# Patient Record
Sex: Female | Born: 1950 | Race: White | Hispanic: No | Smoking: Never smoker
Health system: Southern US, Community
[De-identification: ages and names within clinical notes are randomized; demographics above are authoritative.]

## PROBLEM LIST (undated history)

## (undated) DIAGNOSIS — F329 Major depressive disorder, single episode, unspecified: Secondary | ICD-10-CM

## (undated) DIAGNOSIS — K589 Irritable bowel syndrome without diarrhea: Secondary | ICD-10-CM

## (undated) DIAGNOSIS — E079 Disorder of thyroid, unspecified: Secondary | ICD-10-CM

## (undated) DIAGNOSIS — G894 Chronic pain syndrome: Secondary | ICD-10-CM

## (undated) DIAGNOSIS — K5090 Crohn's disease, unspecified, without complications: Secondary | ICD-10-CM

## (undated) DIAGNOSIS — R51 Headache: Secondary | ICD-10-CM

## (undated) DIAGNOSIS — M810 Age-related osteoporosis without current pathological fracture: Secondary | ICD-10-CM

## (undated) DIAGNOSIS — I639 Cerebral infarction, unspecified: Secondary | ICD-10-CM

## (undated) DIAGNOSIS — N761 Subacute and chronic vaginitis: Secondary | ICD-10-CM

## (undated) DIAGNOSIS — Z872 Personal history of diseases of the skin and subcutaneous tissue: Secondary | ICD-10-CM

## (undated) DIAGNOSIS — I10 Essential (primary) hypertension: Secondary | ICD-10-CM

## (undated) DIAGNOSIS — M5030 Other cervical disc degeneration, unspecified cervical region: Secondary | ICD-10-CM

## (undated) DIAGNOSIS — T7840XA Allergy, unspecified, initial encounter: Secondary | ICD-10-CM

## (undated) DIAGNOSIS — K219 Gastro-esophageal reflux disease without esophagitis: Secondary | ICD-10-CM

## (undated) DIAGNOSIS — Z972 Presence of dental prosthetic device (complete) (partial): Secondary | ICD-10-CM

## (undated) DIAGNOSIS — F32A Depression, unspecified: Secondary | ICD-10-CM

## (undated) DIAGNOSIS — F0390 Unspecified dementia without behavioral disturbance: Secondary | ICD-10-CM

## (undated) DIAGNOSIS — R109 Unspecified abdominal pain: Secondary | ICD-10-CM

## (undated) DIAGNOSIS — E039 Hypothyroidism, unspecified: Secondary | ICD-10-CM

## (undated) DIAGNOSIS — E0590 Thyrotoxicosis, unspecified without thyrotoxic crisis or storm: Secondary | ICD-10-CM

## (undated) DIAGNOSIS — R519 Headache, unspecified: Secondary | ICD-10-CM

## (undated) DIAGNOSIS — R002 Palpitations: Secondary | ICD-10-CM

## (undated) DIAGNOSIS — Z8742 Personal history of other diseases of the female genital tract: Secondary | ICD-10-CM

## (undated) DIAGNOSIS — H8109 Meniere's disease, unspecified ear: Secondary | ICD-10-CM

## (undated) DIAGNOSIS — G8929 Other chronic pain: Secondary | ICD-10-CM

## (undated) DIAGNOSIS — F419 Anxiety disorder, unspecified: Secondary | ICD-10-CM

## (undated) DIAGNOSIS — R5382 Chronic fatigue, unspecified: Secondary | ICD-10-CM

## (undated) DIAGNOSIS — M5126 Other intervertebral disc displacement, lumbar region: Secondary | ICD-10-CM

## (undated) DIAGNOSIS — G9332 Myalgic encephalomyelitis/chronic fatigue syndrome: Secondary | ICD-10-CM

## (undated) DIAGNOSIS — K8689 Other specified diseases of pancreas: Secondary | ICD-10-CM

## (undated) HISTORY — PX: TUBAL LIGATION: SHX77

## (undated) HISTORY — PX: APPENDECTOMY: SHX54

## (undated) HISTORY — DX: Unspecified abdominal pain: R10.9

## (undated) HISTORY — PX: ABDOMINAL HYSTERECTOMY: SHX81

## (undated) HISTORY — DX: Other intervertebral disc displacement, lumbar region: M51.26

## (undated) HISTORY — DX: Palpitations: R00.2

## (undated) HISTORY — DX: Other chronic pain: G89.29

## (undated) HISTORY — PX: KNEE SURGERY: SHX244

## (undated) HISTORY — DX: Chronic pain syndrome: G89.4

## (undated) HISTORY — DX: Myalgic encephalomyelitis/chronic fatigue syndrome: G93.32

## (undated) HISTORY — DX: Meniere's disease, unspecified ear: H81.09

## (undated) HISTORY — DX: Irritable bowel syndrome, unspecified: K58.9

## (undated) HISTORY — DX: Depression, unspecified: F32.A

## (undated) HISTORY — DX: Crohn's disease, unspecified, without complications: K50.90

## (undated) HISTORY — DX: Major depressive disorder, single episode, unspecified: F32.9

## (undated) HISTORY — PX: WISDOM TOOTH EXTRACTION: SHX21

## (undated) HISTORY — DX: Chronic fatigue, unspecified: R53.82

## (undated) HISTORY — DX: Other cervical disc degeneration, unspecified cervical region: M50.30

---

## 2003-11-29 ENCOUNTER — Other Ambulatory Visit: Payer: Self-pay

## 2005-03-08 ENCOUNTER — Ambulatory Visit: Payer: Self-pay

## 2005-06-28 ENCOUNTER — Emergency Department: Payer: Self-pay | Admitting: Emergency Medicine

## 2005-06-28 ENCOUNTER — Other Ambulatory Visit: Payer: Self-pay

## 2010-02-13 ENCOUNTER — Ambulatory Visit: Payer: Self-pay | Admitting: Internal Medicine

## 2010-02-22 ENCOUNTER — Ambulatory Visit: Payer: Self-pay | Admitting: Gastroenterology

## 2010-03-01 ENCOUNTER — Ambulatory Visit: Payer: Self-pay | Admitting: Gastroenterology

## 2010-10-18 ENCOUNTER — Ambulatory Visit: Payer: Self-pay | Admitting: Gastroenterology

## 2011-02-27 ENCOUNTER — Ambulatory Visit: Payer: Self-pay | Admitting: Internal Medicine

## 2011-07-26 ENCOUNTER — Ambulatory Visit: Payer: Self-pay | Admitting: Physician Assistant

## 2012-04-10 ENCOUNTER — Ambulatory Visit: Payer: Self-pay | Admitting: Physical Medicine and Rehabilitation

## 2012-05-05 ENCOUNTER — Ambulatory Visit: Payer: Self-pay | Admitting: Internal Medicine

## 2013-11-25 ENCOUNTER — Ambulatory Visit: Payer: Self-pay | Admitting: Internal Medicine

## 2014-01-14 ENCOUNTER — Ambulatory Visit: Payer: Self-pay | Admitting: Gastroenterology

## 2014-04-07 ENCOUNTER — Ambulatory Visit: Payer: Self-pay | Admitting: Gastroenterology

## 2014-07-12 ENCOUNTER — Emergency Department: Payer: Self-pay | Admitting: Emergency Medicine

## 2014-07-13 DIAGNOSIS — M5126 Other intervertebral disc displacement, lumbar region: Secondary | ICD-10-CM | POA: Insufficient documentation

## 2014-07-13 DIAGNOSIS — M5116 Intervertebral disc disorders with radiculopathy, lumbar region: Secondary | ICD-10-CM | POA: Insufficient documentation

## 2014-07-13 DIAGNOSIS — M5136 Other intervertebral disc degeneration, lumbar region: Secondary | ICD-10-CM | POA: Insufficient documentation

## 2014-07-15 ENCOUNTER — Ambulatory Visit: Payer: Self-pay | Admitting: Physical Medicine and Rehabilitation

## 2014-10-04 ENCOUNTER — Ambulatory Visit: Payer: Self-pay | Admitting: Urgent Care

## 2014-10-04 LAB — COMPREHENSIVE METABOLIC PANEL
ALBUMIN: 3.3 g/dL — AB (ref 3.4–5.0)
ALK PHOS: 110 U/L
ALT: 21 U/L
AST: 22 U/L (ref 15–37)
Anion Gap: 5 — ABNORMAL LOW (ref 7–16)
BUN: 8 mg/dL (ref 7–18)
Bilirubin,Total: 0.2 mg/dL (ref 0.2–1.0)
CALCIUM: 8.2 mg/dL — AB (ref 8.5–10.1)
CHLORIDE: 112 mmol/L — AB (ref 98–107)
CO2: 27 mmol/L (ref 21–32)
Creatinine: 1.14 mg/dL (ref 0.60–1.30)
EGFR (Non-African Amer.): 51 — ABNORMAL LOW
Glucose: 121 mg/dL — ABNORMAL HIGH (ref 65–99)
OSMOLALITY: 286 (ref 275–301)
Potassium: 3.8 mmol/L (ref 3.5–5.1)
Sodium: 144 mmol/L (ref 136–145)
TOTAL PROTEIN: 6.9 g/dL (ref 6.4–8.2)

## 2014-10-04 LAB — CBC WITH DIFFERENTIAL/PLATELET
Basophil #: 0 10*3/uL (ref 0.0–0.1)
Basophil %: 0.2 %
Eosinophil #: 0 10*3/uL (ref 0.0–0.7)
Eosinophil %: 0.2 %
HCT: 40.4 % (ref 35.0–47.0)
HGB: 12.7 g/dL (ref 12.0–16.0)
LYMPHS ABS: 0.8 10*3/uL — AB (ref 1.0–3.6)
LYMPHS PCT: 6.1 %
MCH: 29.3 pg (ref 26.0–34.0)
MCHC: 31.4 g/dL — AB (ref 32.0–36.0)
MCV: 93 fL (ref 80–100)
MONOS PCT: 3.1 %
Monocyte #: 0.4 x10 3/mm (ref 0.2–0.9)
NEUTROS ABS: 11.4 10*3/uL — AB (ref 1.4–6.5)
NEUTROS PCT: 90.4 %
Platelet: 258 10*3/uL (ref 150–440)
RBC: 4.34 10*6/uL (ref 3.80–5.20)
RDW: 15.3 % — ABNORMAL HIGH (ref 11.5–14.5)
WBC: 12.6 10*3/uL — AB (ref 3.6–11.0)

## 2014-11-28 ENCOUNTER — Emergency Department: Payer: Self-pay | Admitting: Internal Medicine

## 2014-11-28 LAB — CBC WITH DIFFERENTIAL/PLATELET
BASOS ABS: 0 10*3/uL (ref 0.0–0.1)
Basophil %: 0.6 %
Eosinophil #: 0.3 10*3/uL (ref 0.0–0.7)
Eosinophil %: 3.4 %
HCT: 39.1 % (ref 35.0–47.0)
HGB: 12.5 g/dL (ref 12.0–16.0)
LYMPHS ABS: 1.1 10*3/uL (ref 1.0–3.6)
Lymphocyte %: 12.5 %
MCH: 30.5 pg (ref 26.0–34.0)
MCHC: 32 g/dL (ref 32.0–36.0)
MCV: 95 fL (ref 80–100)
MONOS PCT: 6.5 %
Monocyte #: 0.6 x10 3/mm (ref 0.2–0.9)
Neutrophil #: 6.5 10*3/uL (ref 1.4–6.5)
Neutrophil %: 77 %
PLATELETS: 230 10*3/uL (ref 150–440)
RBC: 4.1 10*6/uL (ref 3.80–5.20)
RDW: 15.7 % — ABNORMAL HIGH (ref 11.5–14.5)
WBC: 8.5 10*3/uL (ref 3.6–11.0)

## 2014-11-28 LAB — BASIC METABOLIC PANEL
Anion Gap: 8 (ref 7–16)
BUN: 9 mg/dL (ref 7–18)
CO2: 22 mmol/L (ref 21–32)
Calcium, Total: 7.9 mg/dL — ABNORMAL LOW (ref 8.5–10.1)
Chloride: 111 mmol/L — ABNORMAL HIGH (ref 98–107)
Creatinine: 1.23 mg/dL (ref 0.60–1.30)
GFR CALC AF AMER: 56 — AB
GFR CALC NON AF AMER: 47 — AB
GLUCOSE: 136 mg/dL — AB (ref 65–99)
OSMOLALITY: 282 (ref 275–301)
Potassium: 3.3 mmol/L — ABNORMAL LOW (ref 3.5–5.1)
Sodium: 141 mmol/L (ref 136–145)

## 2014-11-28 LAB — TROPONIN I: Troponin-I: <0.02 ng/mL

## 2014-12-04 ENCOUNTER — Emergency Department: Payer: Self-pay | Admitting: Emergency Medicine

## 2014-12-24 ENCOUNTER — Ambulatory Visit: Payer: Self-pay | Admitting: Internal Medicine

## 2014-12-29 DIAGNOSIS — R270 Ataxia, unspecified: Secondary | ICD-10-CM | POA: Insufficient documentation

## 2014-12-29 DIAGNOSIS — R296 Repeated falls: Secondary | ICD-10-CM | POA: Insufficient documentation

## 2014-12-29 DIAGNOSIS — R519 Headache, unspecified: Secondary | ICD-10-CM | POA: Insufficient documentation

## 2014-12-29 DIAGNOSIS — R51 Headache: Secondary | ICD-10-CM

## 2014-12-29 DIAGNOSIS — R42 Dizziness and giddiness: Secondary | ICD-10-CM | POA: Insufficient documentation

## 2015-01-05 ENCOUNTER — Encounter: Payer: Self-pay | Admitting: Neurology

## 2015-01-17 ENCOUNTER — Encounter: Payer: Self-pay | Admitting: Neurology

## 2015-02-15 ENCOUNTER — Encounter
Admit: 2015-02-15 | Disposition: A | Discharge status: Home / Self Care | Payer: Self-pay | Attending: Neurology | Admitting: Neurology

## 2015-03-18 ENCOUNTER — Encounter
Admit: 2015-03-18 | Disposition: A | Discharge status: Home / Self Care | Payer: Self-pay | Attending: Neurology | Admitting: Neurology

## 2015-04-26 ENCOUNTER — Other Ambulatory Visit: Payer: Self-pay | Admitting: Neurology

## 2015-04-26 DIAGNOSIS — R42 Dizziness and giddiness: Secondary | ICD-10-CM

## 2015-04-26 DIAGNOSIS — R270 Ataxia, unspecified: Secondary | ICD-10-CM

## 2015-05-03 ENCOUNTER — Ambulatory Visit
Admission: RE | Admit: 2015-05-03 | Discharge: 2015-05-03 | Disposition: A | Discharge status: Home / Self Care | Payer: Medicare Other | Source: Ambulatory Visit | Attending: Neurology | Admitting: Neurology

## 2015-05-03 DIAGNOSIS — R270 Ataxia, unspecified: Secondary | ICD-10-CM

## 2015-05-03 DIAGNOSIS — R42 Dizziness and giddiness: Secondary | ICD-10-CM

## 2015-09-08 ENCOUNTER — Ambulatory Visit: Payer: Medicare Other | Attending: Internal Medicine | Admitting: Physical Therapy

## 2015-09-08 ENCOUNTER — Encounter: Payer: Self-pay | Admitting: Physical Therapy

## 2015-09-08 DIAGNOSIS — R2689 Other abnormalities of gait and mobility: Secondary | ICD-10-CM

## 2015-09-08 DIAGNOSIS — R42 Dizziness and giddiness: Secondary | ICD-10-CM | POA: Insufficient documentation

## 2015-09-08 DIAGNOSIS — R29818 Other symptoms and signs involving the nervous system: Secondary | ICD-10-CM | POA: Diagnosis present

## 2015-09-08 NOTE — Therapy (Signed)
[place] MAIN [facility] SERVICES [address] [place], [place], [address] Phone: [phone]   Fax:  [phone]  Physical Therapy Evaluation  Patient Details  Name: Valerie Hall MRN: [id number] Date of Birth: 1951/11/19 Referring Provider:  Idelle Crouch, MD  Encounter Date: 09/08/2015      PT End of Session - 09/12/15 1033    Visit Number 1   Number of Visits 8   Date for PT Re-Evaluation 11/03/15   PT Start Time 1344   PT Stop Time 1500   PT Time Calculation (min) 76 min   Equipment Utilized During Treatment Gait belt   Activity Tolerance Patient tolerated treatment well   Behavior During Therapy [facility] for tasks assessed/performed      Past Medical History  Diagnosis Date  . Meniere's disease   . Chronic fatigue syndrome   . Chronic pain syndrome   . Chronic abdominal pain   . DDD (degenerative disc disease), cervical   . Crohn's disease   . HNP (herniated nucleus pulposus), lumbar   . Palpitations   . IBS (irritable bowel syndrome)   . Chronic abdominal pain   . Depression     Past Surgical History  Procedure Laterality Date  . Appendectomy    . Abdominal hysterectomy      There were no vitals filed for this visit.  Visit Diagnosis:  Dizziness and giddiness  Balance problem    VESTIBULAR AND BALANCE EVALUATION  Note: Pt initially wanted to be seen for a screen this date but then decided that she would like to have a PT evaluation. Pt does have an order for outpatient PT services. PT eval was performed this date however, it was an abbreviated evaluation secondary to time constraints. Will complete further testing and assessment next visit.   HISTORY: Pt has been seen in the clinic in the past and was treated for dizziness and giddiness by this writer Jan 2016. Pt has history of dizziness 15 years ago which resolved after about one month. Pt reports that she was told that she had Meniere's disease in the past but  was unclear as to any further details in regards to this issue. Pt is difficult historian and husband was present during the evaluation. Pt reports that she has had chronic issues with dizziness. Pt began to have dizziness again at the end of 2015 and sought PT treatment. Pt however had to stop PT services due to medical issues. Pt reports that she has been in bed for the last month due to her back issues. Pt states that she needs back surgery but does not want to have surgery unless she absolutely needs it. Pt states she felt that her dizziness symptoms had been better since PT earlier this year but states that she feels the symptoms are worsening again and that she notes difficulty with imbalance, unsteadiness, dizziness and vertigo. Pt reports vertigo when she turns right. Pt reports that she has chronic issue with whole body tremors which she reports she has discussed with her physicians in regards to this issue. Pt states she does not know the cause of these tremors.  Description of dizziness: vertigo, unsteadiness,falling, general unsteadiness Symptom nature: motion provoked, spontaneous, intermittent  Frequency and Duration: pt has difficulty reporting frequency and duration of symptoms.   Provocative Factors: turning to the right  Easing Factors:lying still  Progression of symptoms: pt reports her symptoms are better than they were back in January 2016 but states she  feels her symptoms are back and that she is getting dizziness with rolling to the right.  History of similar episodes: yes Falls (yes/no): yes Number of falls in past 6 months: 4  Subjective Prior Functional Level: Pt is limited community ambulator with RW.   Auditory complaints (tinnitus, pain, drainage): denies ear pain, drainage or tinnitus. Pt reports hearing loss non-acute. Vision: (last eye exam, diplopia, recent changes); denies recent changes to her vision.   Review of systems negative for red flags.   EXAMINATION:    POSTURE: rounded shoulders, forward head and slumped sitting posture        COORDINATION: Finger to Nose:  Dysmetric bilaterally Past Pointing:   Left  & Right   Pronator Drift: negative   MUSCULOSKELETAL SCREEN: Cervical Spine ROM: Cervical AROM WFL but noted decreased L/R rotation. Pt reports mild discomfort with neck AROM.    Functional Mobility:  Gait: Pt ambulates with RW with slow cadence with decreased step length and height    Balance: Pt challenged by narrow BOS activities and SLS. Balance on compliant surfaces to be assessed next session.    OCULOMOTOR / VESTIBULAR TESTING:  Oculomotor Exam- Room Light   (N=normal, ABN=abnormal)  Normal Abnormal Comments  Ocular Alignment N    Ocular ROM N    Spontaneous Nystagmus N    End-Gaze Nystagmus N  Age appropriate at end range  Left Head Thrust Deferred     Right Head Thrust Deferred    Head Shaking Nystagmus N  No nystagmus observed and pt reports mild sensation of dizziness, no vertigo; noted patient with whole body shaking towards end of head shaking and upon stopping.    BPPV TESTS:  Symptoms Duration Intensity Nystagmus  L Dix-Hallpike negative  0 none  R Dix-Hallpike vertigo Less than one minute 8/10 Appeared to be direction changing nystagmus         G-Codes - Oct 03, 2015 1044    Functional Assessment Tool Used clinical judgment   Functional Limitation Mobility: Walking and moving around   Mobility: Walking and Moving Around Current Status [phone]) At least 40 percent but less than 60 percent impaired, limited or restricted   Mobility: Walking and Moving Around Goal Status [phone]) At least 20 percent but less than 40 percent impaired, limited or restricted          PT Long Term Goals - 2015/10/03 1039    PT LONG TERM GOAL #1   Title Patient will be able to perform home program independently for self-management.   Time 8   Period Weeks   Status New   PT LONG TERM GOAL #2   Title Patient will reduce  perceived disability to low levels as indicated by <40 on Dizziness Handicap Inventory.   Time 8   Period Weeks   Status New   PT LONG TERM GOAL #3   Title Patient will demonstrate reduced falls risk as evidenced by Dynamic Gait Index (DGI) >19/24.   Time 8   Period Weeks   Status New   PT LONG TERM GOAL #4   Title Patient will have demonstrate decreased falls risk as indicated by Activities Specific Balance Confidence Scale score of 80% or greater.   Time 8   Period Weeks   Status New   PT LONG TERM GOAL #5   Title Patient reports decreased dizziness and vertigo < or equal to 4/10 on dizziness rating scale of 0-10 with provoking motions or positions.   Time 8   Period Weeks  Status New         Plan - 09/12/15 1034    Clinical Impression Statement Patient presents with listed functional deficits. Pt with chronic dizziness and vertigo symptoms that presents to PT services secondary to patient reports recent increase in her symptoms especially with right turns and rolling activties. Pt with potential signs of central issues with vestibular testing this date as direction changing nystagmus was observed with right Dix-hallpike testing. Pt reports 4 falls in the past six months and is at high risk for falls. Pt would benefit from PT services to try to address goals as set on POC, to decrease her subjective symptoms and to decrease her falls risk.    Pt will benefit from skilled therapeutic intervention in order to improve on the following deficits Decreased balance;Decreased coordination;Decreased endurance;Pain;Decreased mobility;Dizziness;Difficulty walking;Postural dysfunction   Rehab Potential Fair   PT Frequency 1x / week   PT Duration 8 weeks   PT Treatment/Interventions Canalith Repostioning;Patient/family education;Neuromuscular re-education;Balance training;Therapeutic exercise;Therapeutic activities;Gait training;Vestibular   PT Next Visit Plan Complete ABC, DGI and DHI tests.  Assess balance on Airex pad. Begin VOR exercise.    Consulted and Agree with Plan of Care Patient;Family member/caregiver   Family Member Consulted husband       Problem List There are no active problems to display for this patient.  Lady Deutscher PT, DPT Lady Deutscher 09/12/2015, 10:59 AM  [place] MAIN [facility] SERVICES [address] [place], [place], [address] Phone: [phone]   Fax:  [phone]

## 2015-09-16 ENCOUNTER — Encounter: Payer: PRIVATE HEALTH INSURANCE | Admitting: Physical Therapy

## 2015-09-27 ENCOUNTER — Encounter: Payer: PRIVATE HEALTH INSURANCE | Admitting: Physical Therapy

## 2015-11-04 ENCOUNTER — Other Ambulatory Visit: Payer: Self-pay | Admitting: Orthopedic Surgery

## 2015-11-04 DIAGNOSIS — M25531 Pain in right wrist: Secondary | ICD-10-CM

## 2015-11-25 ENCOUNTER — Ambulatory Visit
Admission: RE | Admit: 2015-11-25 | Discharge: 2015-11-25 | Disposition: A | Discharge status: Home / Self Care | Payer: Medicare Other | Source: Ambulatory Visit | Attending: Orthopedic Surgery | Admitting: Orthopedic Surgery

## 2015-11-25 DIAGNOSIS — M659 Synovitis and tenosynovitis, unspecified: Secondary | ICD-10-CM | POA: Diagnosis not present

## 2015-11-25 DIAGNOSIS — X58XXXA Exposure to other specified factors, initial encounter: Secondary | ICD-10-CM | POA: Diagnosis not present

## 2015-11-25 DIAGNOSIS — S6991XA Unspecified injury of right wrist, hand and finger(s), initial encounter: Secondary | ICD-10-CM | POA: Diagnosis not present

## 2015-11-25 DIAGNOSIS — M25531 Pain in right wrist: Secondary | ICD-10-CM | POA: Diagnosis present

## 2015-11-25 DIAGNOSIS — M19031 Primary osteoarthritis, right wrist: Secondary | ICD-10-CM | POA: Diagnosis not present

## 2015-12-08 ENCOUNTER — Other Ambulatory Visit: Payer: Self-pay | Admitting: Internal Medicine

## 2015-12-08 DIAGNOSIS — E0590 Thyrotoxicosis, unspecified without thyrotoxic crisis or storm: Secondary | ICD-10-CM

## 2015-12-22 ENCOUNTER — Encounter
Admission: RE | Admit: 2015-12-22 | Discharge: 2015-12-22 | Disposition: A | Discharge status: Home / Self Care | Payer: Medicare HMO | Source: Ambulatory Visit | Attending: Internal Medicine | Admitting: Internal Medicine

## 2015-12-22 DIAGNOSIS — E039 Hypothyroidism, unspecified: Secondary | ICD-10-CM | POA: Insufficient documentation

## 2015-12-22 DIAGNOSIS — E0590 Thyrotoxicosis, unspecified without thyrotoxic crisis or storm: Secondary | ICD-10-CM | POA: Insufficient documentation

## 2015-12-22 HISTORY — DX: Hypothyroidism, unspecified: E03.9

## 2015-12-22 HISTORY — DX: Thyrotoxicosis, unspecified without thyrotoxic crisis or storm: E05.90

## 2015-12-22 MED ORDER — SODIUM IODIDE I-123 7.4 MBQ PO CAPS
153.0000 | ORAL_CAPSULE | Freq: Once | ORAL | Status: AC
  Administered 2015-12-22: 153.9 via ORAL

## 2015-12-23 ENCOUNTER — Encounter
Admission: RE | Admit: 2015-12-23 | Discharge: 2015-12-23 | Disposition: A | Discharge status: Home / Self Care | Payer: Medicare HMO | Source: Ambulatory Visit | Attending: Internal Medicine | Admitting: Internal Medicine

## 2015-12-23 DIAGNOSIS — E0590 Thyrotoxicosis, unspecified without thyrotoxic crisis or storm: Secondary | ICD-10-CM | POA: Diagnosis present

## 2016-04-30 ENCOUNTER — Ambulatory Visit (INDEPENDENT_AMBULATORY_CARE_PROVIDER_SITE_OTHER): Payer: Medicare HMO | Admitting: Gastroenterology

## 2016-04-30 ENCOUNTER — Encounter: Payer: Self-pay | Admitting: Gastroenterology

## 2016-04-30 VITALS — BP 143/72 | HR 80 | Temp 98.4°F | Ht 61.0 in | Wt 163.0 lb

## 2016-04-30 DIAGNOSIS — K50919 Crohn's disease, unspecified, with unspecified complications: Secondary | ICD-10-CM

## 2016-04-30 DIAGNOSIS — G9332 Myalgic encephalomyelitis/chronic fatigue syndrome: Secondary | ICD-10-CM | POA: Insufficient documentation

## 2016-04-30 DIAGNOSIS — M5030 Other cervical disc degeneration, unspecified cervical region: Secondary | ICD-10-CM | POA: Insufficient documentation

## 2016-04-30 DIAGNOSIS — K5090 Crohn's disease, unspecified, without complications: Secondary | ICD-10-CM | POA: Insufficient documentation

## 2016-04-30 DIAGNOSIS — H81319 Aural vertigo, unspecified ear: Secondary | ICD-10-CM | POA: Insufficient documentation

## 2016-04-30 DIAGNOSIS — R5382 Chronic fatigue, unspecified: Secondary | ICD-10-CM

## 2016-04-30 DIAGNOSIS — K580 Irritable bowel syndrome with diarrhea: Secondary | ICD-10-CM | POA: Insufficient documentation

## 2016-04-30 DIAGNOSIS — D8989 Other specified disorders involving the immune mechanism, not elsewhere classified: Secondary | ICD-10-CM | POA: Insufficient documentation

## 2016-04-30 DIAGNOSIS — F32A Depression, unspecified: Secondary | ICD-10-CM | POA: Insufficient documentation

## 2016-04-30 DIAGNOSIS — F329 Major depressive disorder, single episode, unspecified: Secondary | ICD-10-CM | POA: Insufficient documentation

## 2016-05-01 LAB — C-REACTIVE PROTEIN: CRP: 15.9 mg/L — ABNORMAL HIGH (ref 0.0–4.9)

## 2016-05-01 NOTE — Progress Notes (Signed)
Primary Care Physician: Idelle Crouch, MD  Primary Gastroenterologist:  Dr. Lucilla Lame  Chief Complaint  Patient presents with  . Crohn's Disease    HPI: Valerie Hall is a 66 y.o. female here For follow-up of her Crohn's disease. The patient states that she had episodes of weakness and tiredness with some abdominal discomfort. The patient has had an EGD and colonoscopy with Endoscopy in the past with the Only Findings of Some Small Ulcerations in the Small Bowel. At That Time the Patient's CRP Was 75. The Patient Has Been Treated in the past with Cipro, Pentasa and Flagyl. She now reports that the symptoms have gone away. The patient has also reported some weight loss recently without trying. There is no report of any black or bloody stools.  Current Outpatient Prescriptions  Medication Sig Dispense Refill  . acetaZOLAMIDE (DIAMOX) 500 MG capsule Take by mouth.    [name] amitriptyline (ELAVIL) 150 MG tablet Take 150 mg by mouth at bedtime.    [name] atenolol (TENORMIN) 100 MG tablet Take 90 mg by mouth daily.    . cholecalciferol (VITAMIN D) 400 UNITS TABS tablet Take 2,000 Units by mouth.    . clonazePAM (KLONOPIN) 0.5 MG tablet Take by mouth 2 (two) times daily as needed for anxiety.    . diphenoxylate-atropine (LOMOTIL) 2.5-0.025 MG tablet Take by mouth.    . gabapentin (NEURONTIN) 600 MG tablet Take 600 mg by mouth 3 (three) times daily.    . meperidine (DEMEROL) 50 MG tablet TAKE 1/2 TABLET BY MOUTH 3 TIMES A DAY  0  . potassium chloride SA (K-DUR,KLOR-CON) 20 MEQ tablet Take by mouth.    . cyclobenzaprine (FLEXERIL) 10 MG tablet Take 10 mg by mouth 3 (three) times daily as needed for muscle spasms. Reported on 04/30/2016    . diazepam (VALIUM) 5 MG tablet Take 5 mg by mouth every 6 (six) hours as needed for anxiety. Reported on 04/30/2016    . levothyroxine (SYNTHROID, LEVOTHROID) 100 MCG tablet Take 100 mcg by mouth daily before breakfast. Reported on 04/30/2016    . mirtazapine  (REMERON) 15 MG tablet Take 15 mg by mouth at bedtime. Reported on 04/30/2016    . omeprazole (PRILOSEC) 20 MG capsule Take 20 mg by mouth daily. Reported on 04/30/2016     No current facility-administered medications for this visit.    Allergies as of 04/30/2016 - Review Complete 04/30/2016  Allergen Reaction Noted  . Codeine Other (See Comments) 04/30/2016  . Prochlorperazine Other (See Comments) 04/30/2016    ROS:  General: Negative for anorexia, weight loss, fever, chills, fatigue, weakness. ENT: Negative for hoarseness, difficulty swallowing , nasal congestion. CV: Negative for chest pain, angina, palpitations, dyspnea on exertion, peripheral edema.  Respiratory: Negative for dyspnea at rest, dyspnea on exertion, cough, sputum, wheezing.  GI: See history of present illness. GU:  Negative for dysuria, hematuria, urinary incontinence, urinary frequency, nocturnal urination.  Endo: Negative for unusual weight change.    Physical Examination:   BP 143/72 mmHg  Pulse 80  Temp(Src) 98.4 F (36.9 C) (Oral)  Ht 5' 1"  (1.549 m)  Wt 163 lb (73.936 kg)  BMI 30.81 kg/m2  General: Well-nourished, well-developed in no acute distress.  Eyes: No icterus. Conjunctivae pink. Mouth: Oropharyngeal mucosa moist and pink , no lesions erythema or exudate. Lungs: Clear to auscultation bilaterally. Non-labored. Heart: Regular rate and rhythm, no murmurs rubs or gallops.  Abdomen: Bowel sounds are normal, nontender, nondistended, no hepatosplenomegaly or masses, no abdominal  bruits or hernia , no rebound or guarding.   Extremities: No lower extremity edema. No clubbing or deformities. Neuro: Alert and oriented x 3.  Grossly intact. Skin: Warm and dry, no jaundice.   Psych: Alert and cooperative, normal mood and affect.  Labs:    Imaging Studies: No results found.  Assessment and Plan:   Valerie Hall is a 65 y.o. y/o female who has a history of Crohn's disease. The patient is feeling  better now and states that her symptoms have abated but she is still not gaining weight. The patient will have her blood sent off a C-reactive protein and she'll also have her stools checked for fecal calprotectin. Based on this we will decide whether she needs treatment at this time. The patient has been explained the plan and agrees with it.   Note: This dictation was prepared with Dragon dictation along with smaller phrase technology. Any transcriptional errors that result from this process are unintentional.

## 2016-05-03 ENCOUNTER — Other Ambulatory Visit: Payer: Self-pay

## 2016-05-03 ENCOUNTER — Telehealth: Payer: Self-pay

## 2016-05-03 DIAGNOSIS — K50911 Crohn's disease, unspecified, with rectal bleeding: Secondary | ICD-10-CM

## 2016-05-03 MED ORDER — PREDNISONE 20 MG PO TABS: ORAL_TABLET | ORAL | Status: DC

## 2016-05-03 NOTE — Telephone Encounter (Signed)
-----   Message from Lucilla Lame, MD sent at 05/03/2016 12:32 PM EDT ----- Let the patient know that her blood test for the inflammation marker was elevated and she should start back on the Pentasa.

## 2016-05-03 NOTE — Telephone Encounter (Signed)
Pt notified of lab results. Pt prefers to try something else besides Pentasa as this makes her feel bad. Dr. Allen Norris has recommended doing a prednisone taper. Pt okay with doing this. Rx sent to [place] per pt request.

## 2016-05-04 LAB — CALPROTECTIN, FECAL: Calprotectin, Fecal: <16 ug/g (ref 0–120)

## 2016-05-13 ENCOUNTER — Emergency Department: Payer: Medicare HMO

## 2016-05-13 ENCOUNTER — Encounter: Payer: Self-pay | Admitting: Emergency Medicine

## 2016-05-13 ENCOUNTER — Emergency Department
Admission: EM | Admit: 2016-05-13 | Discharge: 2016-05-13 | Disposition: A | Discharge status: Home / Self Care | Payer: Medicare HMO | Attending: Emergency Medicine | Admitting: Emergency Medicine

## 2016-05-13 DIAGNOSIS — E876 Hypokalemia: Secondary | ICD-10-CM | POA: Insufficient documentation

## 2016-05-13 DIAGNOSIS — Z79899 Other long term (current) drug therapy: Secondary | ICD-10-CM | POA: Diagnosis not present

## 2016-05-13 DIAGNOSIS — R531 Weakness: Secondary | ICD-10-CM | POA: Diagnosis not present

## 2016-05-13 DIAGNOSIS — E039 Hypothyroidism, unspecified: Secondary | ICD-10-CM | POA: Diagnosis not present

## 2016-05-13 DIAGNOSIS — R4182 Altered mental status, unspecified: Secondary | ICD-10-CM | POA: Insufficient documentation

## 2016-05-13 DIAGNOSIS — F329 Major depressive disorder, single episode, unspecified: Secondary | ICD-10-CM | POA: Insufficient documentation

## 2016-05-13 LAB — CBC WITH DIFFERENTIAL/PLATELET
Basophils Absolute: 0.1 10*3/uL (ref 0–0.1)
EOS ABS: 0.1 10*3/uL (ref 0–0.7)
Eosinophils Relative: 1 %
HCT: 40.4 % (ref 35.0–47.0)
HEMOGLOBIN: 13.3 g/dL (ref 12.0–16.0)
Lymphocytes Relative: 16 %
Lymphs Abs: 1.8 10*3/uL (ref 1.0–3.6)
MCH: 31 pg (ref 26.0–34.0)
MCHC: 32.9 g/dL (ref 32.0–36.0)
MCV: 94.3 fL (ref 80.0–100.0)
Monocytes Absolute: 0.6 10*3/uL (ref 0.2–0.9)
Monocytes Relative: 6 %
Neutro Abs: 8.8 10*3/uL — ABNORMAL HIGH (ref 1.4–6.5)
Platelets: 255 10*3/uL (ref 150–440)
RBC: 4.29 MIL/uL (ref 3.80–5.20)
RDW: 14.4 % (ref 11.5–14.5)
WBC: 11.4 10*3/uL — AB (ref 3.6–11.0)

## 2016-05-13 LAB — URINALYSIS COMPLETE WITH MICROSCOPIC (ARMC ONLY)
BILIRUBIN URINE: NEGATIVE
GLUCOSE, UA: NEGATIVE mg/dL
HGB URINE DIPSTICK: NEGATIVE
LEUKOCYTES UA: NEGATIVE
NITRITE: NEGATIVE
Protein, ur: NEGATIVE mg/dL
RBC / HPF: NONE SEEN RBC/hpf (ref 0–5)
SPECIFIC GRAVITY, URINE: 1.006 (ref 1.005–1.030)
pH: 7 (ref 5.0–8.0)

## 2016-05-13 LAB — COMPREHENSIVE METABOLIC PANEL
ALBUMIN: 4.1 g/dL (ref 3.5–5.0)
ALT: 16 U/L (ref 14–54)
ANION GAP: 14 (ref 5–15)
AST: 27 U/L (ref 15–41)
Alkaline Phosphatase: 118 U/L (ref 38–126)
BUN: 9 mg/dL (ref 6–20)
CHLORIDE: 105 mmol/L (ref 101–111)
CO2: 23 mmol/L (ref 22–32)
CREATININE: 1.08 mg/dL — AB (ref 0.44–1.00)
Calcium: 9 mg/dL (ref 8.9–10.3)
GFR calc non Af Amer: 52 mL/min — ABNORMAL LOW (ref >60)
Glucose, Bld: 142 mg/dL — ABNORMAL HIGH (ref 65–99)
Potassium: 2.8 mmol/L — CL (ref 3.5–5.1)
SODIUM: 142 mmol/L (ref 135–145)
Total Bilirubin: 1 mg/dL (ref 0.3–1.2)
Total Protein: 6.9 g/dL (ref 6.5–8.1)

## 2016-05-13 LAB — BASIC METABOLIC PANEL
Anion gap: 7 (ref 5–15)
BUN: 8 mg/dL (ref 6–20)
CALCIUM: 8.2 mg/dL — AB (ref 8.9–10.3)
CO2: 27 mmol/L (ref 22–32)
CREATININE: 0.99 mg/dL (ref 0.44–1.00)
Chloride: 107 mmol/L (ref 101–111)
GFR calc Af Amer: >60 mL/min (ref >60)
GFR, EST NON AFRICAN AMERICAN: 58 mL/min — AB (ref >60)
Glucose, Bld: 97 mg/dL (ref 65–99)
POTASSIUM: 3.4 mmol/L — AB (ref 3.5–5.1)
SODIUM: 141 mmol/L (ref 135–145)

## 2016-05-13 LAB — AMMONIA: AMMONIA: 24 umol/L (ref 9–35)

## 2016-05-13 LAB — GLUCOSE, CAPILLARY: Glucose-Capillary: 130 mg/dL — ABNORMAL HIGH (ref 65–99)

## 2016-05-13 LAB — LIPASE, BLOOD: LIPASE: 16 U/L (ref 11–51)

## 2016-05-13 LAB — ETHANOL: Alcohol, Ethyl (B): <5 mg/dL (ref <5)

## 2016-05-13 LAB — TROPONIN I

## 2016-05-13 LAB — SALICYLATE LEVEL: Salicylate Lvl: <4 mg/dL (ref 2.8–30.0)

## 2016-05-13 LAB — MAGNESIUM: Magnesium: 1.8 mg/dL (ref 1.7–2.4)

## 2016-05-13 LAB — ACETAMINOPHEN LEVEL

## 2016-05-13 MED ORDER — LORAZEPAM 2 MG/ML IJ SOLN
1.0000 mg | Freq: Once | INTRAMUSCULAR | Status: AC
  Administered 2016-05-13: 1 mg via INTRAVENOUS
  Filled 2016-05-13: qty 1

## 2016-05-13 MED ORDER — POTASSIUM CHLORIDE CRYS ER 20 MEQ PO TBCR
40.0000 meq | EXTENDED_RELEASE_TABLET | Freq: Once | ORAL | Status: AC
Start: 2016-05-13 — End: 2016-05-13
  Administered 2016-05-13: 40 meq via ORAL
  Filled 2016-05-13: qty 2

## 2016-05-13 MED ORDER — POTASSIUM CHLORIDE 10 MEQ/100ML IV SOLN
10.0000 meq | Freq: Once | INTRAVENOUS | Status: AC
  Administered 2016-05-13: 10 meq via INTRAVENOUS
  Filled 2016-05-13: qty 100

## 2016-05-13 MED ORDER — SODIUM CHLORIDE 0.9 % IV BOLUS (SEPSIS)
500.0000 mL | Freq: Once | INTRAVENOUS | Status: AC
  Administered 2016-05-13: 500 mL via INTRAVENOUS

## 2016-05-13 MED ORDER — POTASSIUM CHLORIDE CRYS ER 20 MEQ PO TBCR: 40.0000 meq | EXTENDED_RELEASE_TABLET | Freq: Every day | ORAL | Status: DC

## 2016-05-13 NOTE — ED Provider Notes (Addendum)
[facility] Department Provider Note  ____________________________________________   I have reviewed the triage vital signs and the nursing notes.   HISTORY  Chief Complaint Weakness and Shaking    HPI Valerie Hall is a 65 y.o. female who presents today complaining of feeling weak all over and feeling very anxious. Patient has had episodes like this before. She has history of Crohn's disease although she has had no change in her chronic loose stools and no bleeding. She has a history ofirritable bowel syndrome, Crohn's disease depression thyroid issues, Mnire's disease chronic fatigue syndrome chronic pain syndrome, abdominal pain, panic attacks anxiety, palpitations, patient states she is having no pain. She states that her symptoms began about an hour ago. Patient is very anxious and upset. Husband states that she has decreased appetite but otherwise is been normal recently. She began to be very anxious recently. This is something he has seen from her many times before he states. Sometimes it happens when her potassium is low and she "can feel that it is low. Patient states she feels shaky all over. She denies any focal numbness or weakness or headache.    Past Medical History  Diagnosis Date  . Meniere's disease   . Chronic fatigue syndrome   . Chronic pain syndrome   . Chronic abdominal pain   . DDD (degenerative disc disease), cervical   . Crohn's disease ([place])   . HNP (herniated nucleus pulposus), lumbar   . Palpitations   . IBS (irritable bowel syndrome)   . Chronic abdominal pain   . Depression   . Hyperthyroidism     Per Pt, elevated thyroid levels for 4 months +  . Hypothyroidism     Hx of hypothyroidism, 10 years +    Patient Active Problem List   Diagnosis Date Noted  . Auditory vertigo 04/30/2016  . Irritable bowel syndrome with diarrhea 04/30/2016  . Clinical depression 04/30/2016  . Degeneration of intervertebral disc  of cervical region 04/30/2016  . Crohn's disease ([place]) 04/30/2016  . CFIDS (chronic fatigue and immune dysfunction syndrome) 04/30/2016  . Hypothyroidism   . Head revolving around 12/29/2014  . Cephalalgia 12/29/2014  . Excessive falling 12/29/2014  . Appendicular ataxia 12/29/2014  . Neuritis or radiculitis due to rupture of lumbar intervertebral disc 07/13/2014  . Bulge of lumbar disc without myelopathy 07/13/2014    Past Surgical History  Procedure Laterality Date  . Appendectomy    . Abdominal hysterectomy      Current Outpatient Rx  Name  Route  Sig  Dispense  Refill  . acetaZOLAMIDE (DIAMOX) 500 MG capsule   Oral   Take by mouth.         [name] amitriptyline (ELAVIL) 150 MG tablet   Oral   Take 150 mg by mouth at bedtime.         [name] atenolol (TENORMIN) 100 MG tablet   Oral   Take 90 mg by mouth daily.         . cholecalciferol (VITAMIN D) 400 UNITS TABS tablet   Oral   Take 2,000 Units by mouth.         . clonazePAM (KLONOPIN) 0.5 MG tablet   Oral   Take by mouth 2 (two) times daily as needed for anxiety.         . cyclobenzaprine (FLEXERIL) 10 MG tablet   Oral   Take 10 mg by mouth 3 (three) times daily as needed for muscle spasms. Reported on 04/30/2016         .  diazepam (VALIUM) 5 MG tablet   Oral   Take 5 mg by mouth every 6 (six) hours as needed for anxiety. Reported on 04/30/2016         . gabapentin (NEURONTIN) 600 MG tablet   Oral   Take 600 mg by mouth 3 (three) times daily.         [name] levothyroxine (SYNTHROID, LEVOTHROID) 100 MCG tablet   Oral   Take 100 mcg by mouth daily before breakfast. Reported on 04/30/2016         . meperidine (DEMEROL) 50 MG tablet      TAKE 1/2 TABLET BY MOUTH 3 TIMES A DAY      0   . mirtazapine (REMERON) 15 MG tablet   Oral   Take 15 mg by mouth at bedtime. Reported on 04/30/2016         . omeprazole (PRILOSEC) 20 MG capsule   Oral   Take 20 mg by mouth daily. Reported on 04/30/2016         .  potassium chloride SA (K-DUR,KLOR-CON) 20 MEQ tablet   Oral   Take by mouth.         . predniSONE (DELTASONE) 20 MG tablet      Take 70m daily x 2 weeks, 383mdaily x 2 weeks, 2073maily x 2 weeks, then 34m23mily x 2 weeks   56 tablet   0     Allergies Codeine and Prochlorperazine  Family History  Problem Relation Age of Onset  . Hypertension Mother   . Diabetes Mother   . CAD Mother   . Cancer Mother   . Hypertension Father   . Diabetes Father   . CAD Father   . Cancer Father   . Heart attack Father   . Hypertension Brother     Social History Social History  Substance Use Topics  . Smoking status: Never Smoker   . Smokeless tobacco: Never Used  . Alcohol Use: No    Review of Systems Constitutional: No fever/chills Eyes: No visual changes. ENT: No sore throat. No stiff neck no neck pain Cardiovascular: Denies chest pain. Respiratory: Denies shortness of breath. Gastrointestinal:   no vomiting.  No diarrhea.  No constipation. Genitourinary: Negative for dysuria. Musculoskeletal: Negative lower extremity swelling Skin: Negative for rash. Neurological: Negative for headaches, focal weakness or numbness. 10-point ROS otherwise negative.  ____________________________________________   PHYSICAL EXAM:  VITAL SIGNS: ED Triage Vitals  Enc Vitals Group     BP 05/13/16 1830 194/80 mmHg     Pulse Rate 05/13/16 1835 101     Resp 05/13/16 1835 33     Temp 05/13/16 1830 98.3 F (36.8 C)     Temp Source 05/13/16 1830 Oral     SpO2 05/13/16 1835 100 %     Weight 05/13/16 1830 165 lb (74.844 kg)     Height 05/13/16 1830 5' 3"  (1.6 m)     Head Cir --      Peak Flow --      Pain Score --      Pain Loc --      Pain Edu? --      Excl. in GC? [place]     Constitutional: Alert and oriented. Patient is very very anxious and upset tremulous but otherwise medically nontoxic Eyes: Conjunctivae are normal. PERRL. EOMI. Head: Atraumatic. Nose: No  congestion/rhinnorhea. Mouth/Throat: Mucous membranes are moist.  Oropharynx non-erythematous. Neck: No stridor.   Nontender with no meningismus Cardiovascular: Normal  rate, regular rhythm. Grossly normal heart sounds.  Good peripheral circulation. Respiratory: Normal respiratory effort.  No retractions. Lungs CTAB. Abdominal: Soft and nontender. No distention. No guarding no rebound Back:  There is no focal tenderness or step off there is no midline tenderness there are no lesions noted. there is no CVA tenderness Musculoskeletal: No lower extremity tenderness. No joint effusions, no DVT signs strong distal pulses no edema Neurologic:  Normal speech and language. No gross focal neurologic deficits are appreciated.  Skin:  Skin is warm, dry and intact. No rash noted. Psychiatric: Mood and affect are very anxious. Speech and behavior are normal.  ____________________________________________   LABS (all labs ordered are listed, but only abnormal results are displayed)  Labs Reviewed  GLUCOSE, CAPILLARY - Abnormal; Notable for the following:    Glucose-Capillary 130 (*)    All other components within normal limits  COMPREHENSIVE METABOLIC PANEL - Abnormal; Notable for the following:    Potassium 2.8 (*)    Glucose, Bld 142 (*)    Creatinine, Ser 1.08 (*)    GFR calc non Af Amer 52 (*)    All other components within normal limits  ACETAMINOPHEN LEVEL - Abnormal; Notable for the following:    Acetaminophen (Tylenol), Serum <10 (*)    All other components within normal limits  CBC WITH DIFFERENTIAL/PLATELET - Abnormal; Notable for the following:    WBC 11.4 (*)    Neutro Abs 8.8 (*)    All other components within normal limits  URINE CULTURE  ETHANOL  LIPASE, BLOOD  SALICYLATE LEVEL  AMMONIA  TROPONIN I  MAGNESIUM  URINALYSIS COMPLETEWITH MICROSCOPIC ([facility] ONLY)   ____________________________________________  EKG  I personally interpreted any EKGs ordered by me or  triage Sinus rhythm rate 97 bpm, RSR prime configuration, nonspecific ST changes, no acute ischemia ____________________________________________  RADIOLOGY  I reviewed any imaging ordered by me or triage that were performed during my shift and, if possible, patient and/or family made aware of any abnormal findings. ____________________________________________   PROCEDURES  Procedure(s) performed: None  Critical Care performed: None  ____________________________________________   INITIAL IMPRESSION / ASSESSMENT AND PLAN / ED COURSE  Pertinent labs & imaging results that were available during my care of the patient were reviewed by me and considered in my medical decision making (see chart for details).  Patient presents today complaining of feeling anxious and shaky all over. Her potassium is low although this constellation of symptoms are not likely to be caused by a borderline potassium on its own. The rest of her electrolytes are reassuring. Renal function is preserved electrolytes look good side from her recurrent hypokalemia. No history of alcohol abuse to suggest this is withdrawal, no evidence of overdose, CBC is reassuring, ammonia level is negative, CT head and chest x-ray are reassuring. Magnesium is reassuring. We will replete her potassium. Patient did ask for Ativan because she was feeling very anxious and we gave her some and she feels much better at this time. Vital signs are reassuring. We will continue to observe her in the emergency department pending potassium repletion and reevaluate. At this time, patient has no evidence of CVA or acute neurologic pathology causing her symptoms.   ----------------------------------------- 9:27 PM on 05/13/2016 -----------------------------------------  Patient in no acute distress resting comfortably in bed all of her symptoms away shortly after Ativan she feels much better she is no longer anxious she is requesting discharge as  soon as possible. We will recheck potassium after repletion. She is  very much more comfortable at this time. ____________________________________________   FINAL CLINICAL IMPRESSION(S) / ED DIAGNOSES  Final diagnoses:  Weakness      This chart was dictated using voice recognition software.  Despite best efforts to proofread,  errors can occur which can change meaning.     Schuyler Amor, MD 05/13/16 8706  Schuyler Amor, MD 05/13/16 2127

## 2016-05-13 NOTE — Discharge Instructions (Signed)
Hypokalemia Hypokalemia means that the amount of potassium in the blood is lower than normal.Potassium is a chemical, called an electrolyte, that helps regulate the amount of fluid in the body. It also stimulates muscle contraction and helps nerves function properly.Most of the body's potassium is inside of cells, and only a very small amount is in the blood. Because the amount in the blood is so small, minor changes can be life-threatening. CAUSES  Antibiotics.  Diarrhea or vomiting.  Using laxatives too much, which can cause diarrhea.  Chronic kidney disease.  Water pills (diuretics).  Eating disorders (bulimia).  Low magnesium level.  Sweating a lot. SIGNS AND SYMPTOMS  Weakness.  Constipation.  Fatigue.  Muscle cramps.  Mental confusion.  Skipped heartbeats or irregular heartbeat (palpitations).  Tingling or numbness. DIAGNOSIS  Your health care provider can diagnose hypokalemia with blood tests. In addition to checking your potassium level, your health care provider may also check other lab tests. TREATMENT Hypokalemia can be treated with potassium supplements taken by mouth or adjustments in your current medicines. If your potassium level is very low, you may need to get potassium through a vein (IV) and be monitored in the hospital. A diet high in potassium is also helpful. Foods high in potassium are:  Nuts, such as peanuts and pistachios.  Seeds, such as sunflower seeds and pumpkin seeds.  Peas, lentils, and lima beans.  Whole grain and bran cereals and breads.  Fresh fruit and vegetables, such as apricots, avocado, bananas, cantaloupe, kiwi, oranges, tomatoes, asparagus, and potatoes.  Orange and tomato juices.  Red meats.  Fruit yogurt. HOME CARE INSTRUCTIONS  Take all medicines as prescribed by your health care provider.  Maintain a healthy diet by including nutritious food, such as fruits, vegetables, nuts, whole grains, and lean meats.  If  you are taking a laxative, be sure to follow the directions on the label. SEEK MEDICAL CARE IF:  Your weakness gets worse.  You feel your heart pounding or racing.  You are vomiting or having diarrhea.  You are diabetic and having trouble keeping your blood glucose in the normal range. SEEK IMMEDIATE MEDICAL CARE IF:  You have chest pain, shortness of breath, or dizziness.  You are vomiting or having diarrhea for more than 2 days.  You faint. MAKE SURE YOU:   Understand these instructions.  Will watch your condition.  Will get help right away if you are not doing well or get worse.   This information is not intended to replace advice given to you by your health care provider. Make sure you discuss any questions you have with your health care provider.   Document Released: 12/03/2005 Document Revised: 12/24/2014 Document Reviewed: 06/05/2013 Elsevier Interactive Patient Education [employer].

## 2016-05-13 NOTE — ED Notes (Signed)
Pt arrived by POV w/ husband who states the pt became shaky and less talkative about an hour prior to arriving at the ED. Husbands states pt has had similar episodes in the past and it was determined that her potassium was low. He also states she has low Vit D.

## 2016-05-15 ENCOUNTER — Encounter (HOSPITAL_COMMUNITY): Payer: Self-pay

## 2016-05-15 ENCOUNTER — Emergency Department (HOSPITAL_COMMUNITY): Payer: Medicare HMO

## 2016-05-15 ENCOUNTER — Emergency Department (HOSPITAL_COMMUNITY)
Admission: EM | Admit: 2016-05-15 | Discharge: 2016-05-15 | Disposition: A | Discharge status: Home / Self Care | Payer: Medicare HMO | Attending: Emergency Medicine | Admitting: Emergency Medicine

## 2016-05-15 DIAGNOSIS — Z79899 Other long term (current) drug therapy: Secondary | ICD-10-CM | POA: Insufficient documentation

## 2016-05-15 DIAGNOSIS — F410 Panic disorder [episodic paroxysmal anxiety] without agoraphobia: Secondary | ICD-10-CM

## 2016-05-15 DIAGNOSIS — R491 Aphonia: Secondary | ICD-10-CM | POA: Diagnosis not present

## 2016-05-15 DIAGNOSIS — F329 Major depressive disorder, single episode, unspecified: Secondary | ICD-10-CM | POA: Diagnosis not present

## 2016-05-15 DIAGNOSIS — R51 Headache: Secondary | ICD-10-CM | POA: Diagnosis not present

## 2016-05-15 DIAGNOSIS — K5090 Crohn's disease, unspecified, without complications: Secondary | ICD-10-CM | POA: Insufficient documentation

## 2016-05-15 DIAGNOSIS — R109 Unspecified abdominal pain: Secondary | ICD-10-CM | POA: Insufficient documentation

## 2016-05-15 LAB — COMPREHENSIVE METABOLIC PANEL
ALT: 16 U/L (ref 14–54)
ANION GAP: 11 (ref 5–15)
AST: 32 U/L (ref 15–41)
Albumin: 3.6 g/dL (ref 3.5–5.0)
Alkaline Phosphatase: 111 U/L (ref 38–126)
BUN: 6 mg/dL (ref 6–20)
CHLORIDE: 110 mmol/L (ref 101–111)
CO2: 20 mmol/L — AB (ref 22–32)
Calcium: 9.2 mg/dL (ref 8.9–10.3)
Creatinine, Ser: 1.11 mg/dL — ABNORMAL HIGH (ref 0.44–1.00)
GFR, EST AFRICAN AMERICAN: 59 mL/min — AB (ref >60)
GFR, EST NON AFRICAN AMERICAN: 51 mL/min — AB (ref >60)
Glucose, Bld: 109 mg/dL — ABNORMAL HIGH (ref 65–99)
Potassium: 4 mmol/L (ref 3.5–5.1)
SODIUM: 141 mmol/L (ref 135–145)
Total Bilirubin: 0.9 mg/dL (ref 0.3–1.2)
Total Protein: 6.3 g/dL — ABNORMAL LOW (ref 6.5–8.1)

## 2016-05-15 LAB — URINALYSIS, ROUTINE W REFLEX MICROSCOPIC
BILIRUBIN URINE: NEGATIVE
Glucose, UA: NEGATIVE mg/dL
Hgb urine dipstick: NEGATIVE
Ketones, ur: 15 mg/dL — AB
Leukocytes, UA: NEGATIVE
NITRITE: NEGATIVE
PH: 7 (ref 5.0–8.0)
Protein, ur: NEGATIVE mg/dL
SPECIFIC GRAVITY, URINE: 1.005 (ref 1.005–1.030)

## 2016-05-15 LAB — I-STAT CHEM 8, ED
BUN: 4 mg/dL — ABNORMAL LOW (ref 6–20)
CALCIUM ION: 1.01 mmol/L — AB (ref 1.13–1.30)
CHLORIDE: 108 mmol/L (ref 101–111)
Creatinine, Ser: 1 mg/dL (ref 0.44–1.00)
Glucose, Bld: 113 mg/dL — ABNORMAL HIGH (ref 65–99)
HCT: 42 % (ref 36.0–46.0)
HEMOGLOBIN: 14.3 g/dL (ref 12.0–15.0)
Potassium: 3.8 mmol/L (ref 3.5–5.1)
SODIUM: 143 mmol/L (ref 135–145)
TCO2: 23 mmol/L (ref 0–100)

## 2016-05-15 LAB — PROTIME-INR
INR: 1.14 (ref 0.00–1.49)
PROTHROMBIN TIME: 14.8 s (ref 11.6–15.2)

## 2016-05-15 LAB — CBC
HCT: 42.3 % (ref 36.0–46.0)
Hemoglobin: 13.2 g/dL (ref 12.0–15.0)
MCH: 30.3 pg (ref 26.0–34.0)
MCHC: 31.2 g/dL (ref 30.0–36.0)
MCV: 97 fL (ref 78.0–100.0)
PLATELETS: 222 10*3/uL (ref 150–400)
RBC: 4.36 MIL/uL (ref 3.87–5.11)
RDW: 14 % (ref 11.5–15.5)
WBC: 10.5 10*3/uL (ref 4.0–10.5)

## 2016-05-15 LAB — ETHANOL

## 2016-05-15 LAB — RAPID URINE DRUG SCREEN, HOSP PERFORMED
AMPHETAMINES: NOT DETECTED
BENZODIAZEPINES: POSITIVE — AB
Barbiturates: NOT DETECTED
Cocaine: NOT DETECTED
OPIATES: NOT DETECTED
Tetrahydrocannabinol: NOT DETECTED

## 2016-05-15 LAB — URINE CULTURE

## 2016-05-15 LAB — DIFFERENTIAL
BASOS PCT: 0 %
Basophils Absolute: 0 10*3/uL (ref 0.0–0.1)
EOS ABS: 0.3 10*3/uL (ref 0.0–0.7)
EOS PCT: 3 %
Lymphocytes Relative: 21 %
Lymphs Abs: 2.2 10*3/uL (ref 0.7–4.0)
MONO ABS: 0.8 10*3/uL (ref 0.1–1.0)
Monocytes Relative: 8 %
Neutro Abs: 7.2 10*3/uL (ref 1.7–7.7)
Neutrophils Relative %: 68 %

## 2016-05-15 LAB — APTT: aPTT: 26 seconds (ref 24–37)

## 2016-05-15 LAB — I-STAT TROPONIN, ED: TROPONIN I, POC: 0 ng/mL (ref 0.00–0.08)

## 2016-05-15 MED ORDER — ONDANSETRON HCL 4 MG/2ML IJ SOLN
4.0000 mg | Freq: Once | INTRAMUSCULAR | Status: AC
Start: 2016-05-15 — End: 2016-05-15
  Administered 2016-05-15: 4 mg via INTRAVENOUS
  Filled 2016-05-15: qty 2

## 2016-05-15 MED ORDER — HYDROMORPHONE HCL 1 MG/ML IJ SOLN
1.0000 mg | Freq: Once | INTRAMUSCULAR | Status: AC
  Administered 2016-05-15: 1 mg via INTRAVENOUS
  Filled 2016-05-15: qty 1

## 2016-05-15 MED ORDER — KETOROLAC TROMETHAMINE 30 MG/ML IJ SOLN
15.0000 mg | Freq: Once | INTRAMUSCULAR | Status: AC
  Administered 2016-05-15: 15 mg via INTRAVENOUS
  Filled 2016-05-15: qty 1

## 2016-05-15 MED ORDER — LORAZEPAM 2 MG/ML IJ SOLN
0.5000 mg | Freq: Once | INTRAMUSCULAR | Status: AC
  Administered 2016-05-15: 0.5 mg via INTRAVENOUS
  Filled 2016-05-15: qty 1

## 2016-05-15 NOTE — Consult Note (Signed)
Requesting Physician: Dr. Burlene Arnt     Reason for consultation:  To rule out acute stroke  HPI:                                                                                                                                         Valerie Hall is an 65 y.o. female patient who  was brought into the emergency room due to increased anxiety, and tremor status in the limbs . A code stroke was called in the field by EMS. Patient presented with similar anxiety symptoms 2 days ago at [facility], symptoms resolved with Ativan and was discharged home. No family at bedside during my evaluation. Patient denies any vision problems or any focal weakness in limbs.   Date last known well:  2017, May 30 Time last known well:  Unknown tPA Given: No: No evidence of acute stroke on exam  Stroke Risk Factors - hypertension  Past Medical History: Past Medical History  Diagnosis Date  . Meniere's disease   . Chronic fatigue syndrome   . Chronic pain syndrome   . Chronic abdominal pain   . DDD (degenerative disc disease), cervical   . Crohn's disease ([place])   . HNP (herniated nucleus pulposus), lumbar   . Palpitations   . IBS (irritable bowel syndrome)   . Chronic abdominal pain   . Depression   . Hyperthyroidism     Per Pt, elevated thyroid levels for 4 months +  . Hypothyroidism     Hx of hypothyroidism, 10 years +    Past Surgical History  Procedure Laterality Date  . Appendectomy    . Abdominal hysterectomy      Family History: Family History  Problem Relation Age of Onset  . Hypertension Mother   . Diabetes Mother   . CAD Mother   . Cancer Mother   . Hypertension Father   . Diabetes Father   . CAD Father   . Cancer Father   . Heart attack Father   . Hypertension Brother     Social History:   reports that she has never smoked. She has never used smokeless tobacco. She reports that she does not drink alcohol or use illicit drugs.  Allergies:  Allergies   Allergen Reactions  . Codeine Other (See Comments)  . Prochlorperazine Other (See Comments)     Medications:  No current facility-administered medications for this encounter.  Current outpatient prescriptions:  .  acetaZOLAMIDE (DIAMOX) 500 MG capsule, Take by mouth., Disp: , Rfl:  .  amitriptyline (ELAVIL) 150 MG tablet, Take 150 mg by mouth at bedtime., Disp: , Rfl:  .  atenolol (TENORMIN) 100 MG tablet, Take 90 mg by mouth daily., Disp: , Rfl:  .  cholecalciferol (VITAMIN D) 400 UNITS TABS tablet, Take 2,000 Units by mouth., Disp: , Rfl:  .  clonazePAM (KLONOPIN) 0.5 MG tablet, Take by mouth 2 (two) times daily as needed for anxiety., Disp: , Rfl:  .  cyclobenzaprine (FLEXERIL) 10 MG tablet, Take 10 mg by mouth 3 (three) times daily as needed for muscle spasms. Reported on 04/30/2016, Disp: , Rfl:  .  diazepam (VALIUM) 5 MG tablet, Take 5 mg by mouth every 6 (six) hours as needed for anxiety. Reported on 04/30/2016, Disp: , Rfl:  .  gabapentin (NEURONTIN) 600 MG tablet, Take 600 mg by mouth 3 (three) times daily., Disp: , Rfl:  .  levothyroxine (SYNTHROID, LEVOTHROID) 100 MCG tablet, Take 100 mcg by mouth daily before breakfast. Reported on 04/30/2016, Disp: , Rfl:  .  meperidine (DEMEROL) 50 MG tablet, TAKE 1/2 TABLET BY MOUTH 3 TIMES A DAY, Disp: , Rfl: 0 .  mirtazapine (REMERON) 15 MG tablet, Take 15 mg by mouth at bedtime. Reported on 04/30/2016, Disp: , Rfl:  .  omeprazole (PRILOSEC) 20 MG capsule, Take 20 mg by mouth daily. Reported on 04/30/2016, Disp: , Rfl:  .  potassium chloride SA (K-DUR,KLOR-CON) 20 MEQ tablet, Take 2 tablets (40 mEq total) by mouth daily. (Patient taking differently: Take 20 mEq by mouth daily. ), Disp: 10 tablet, Rfl: 0 .  predniSONE (DELTASONE) 20 MG tablet, Take 72m daily x 2 weeks, 336mdaily x 2 weeks, 201maily x 2 weeks, then 2m103mily x  2 weeks, Disp: 56 tablet, Rfl: 0   ROS:                                                                                                                                       History    unobtainable from patient due to lack of cooperation  Neurologic Examination:                                                                                                    Today's Vitals   05/15/16 1730 05/15/16 1735 05/15/16 1745 05/15/16 1816  BP: 156/66  144/63 162/71  Pulse: 70  64 83  Temp:      TempSrc:      Resp: 15  18 24   Height:      Weight:      SpO2: 100%  100% 100%  PainSc:  8      Patient had intermittent voluntary psychogenic-appearing tremulousness in all 4 extremities and also up and down movements of her pelvis on the bed. Able to stop her movements when asked to do so. She does not follow commands at times which also appears to be psychogenic requires redirection and multiple prompts after which she does follow commands appropriately, speech is normal. Grossly nonfocal neurological examination.  Evaluation of higher integrative functions including: Level of alertness: Alert,  Oriented to time, place and person Speech: fluent, no evidence of dysarthria or aphasia noted.  Test the following cranial nerves: 2-12 grossly intact Motor examination: Normal tone, bulk, full 5/5 motor strength in all 4 extremities Examination of sensation : Normal and symmetric sensation to pinprick in all 4 extremities and on face Examination of deep tendon reflexes: 2+, normal and symmetric in all extremities, normal plantars bilaterally Test coordination: Normal finger nose testing, with no evidence of limb appendicular ataxia or abnormal involuntary movements or tremors noted.  Gait: Deferred     Lab Results: Basic Metabolic Panel:  Recent Labs Lab 05/13/16 1900 05/13/16 2224 05/15/16 1610 05/15/16 1613  NA 142 141 141 143  K 2.8* 3.4* 4.0 3.8  CL 105 107 110 108  CO2 23 27 20*  --    GLUCOSE 142* 97 109* 113*  BUN 9 8 6  4*  CREATININE 1.08* 0.99 1.11* 1.00  CALCIUM 9.0 8.2* 9.2  --   MG 1.8  --   --   --     Liver Function Tests:  Recent Labs Lab 05/13/16 1900 05/15/16 1610  AST 27 32  ALT 16 16  ALKPHOS 118 111  BILITOT 1.0 0.9  PROT 6.9 6.3*  ALBUMIN 4.1 3.6    Recent Labs Lab 05/13/16 1900  LIPASE 16    Recent Labs Lab 05/13/16 1900  AMMONIA 24    CBC:  Recent Labs Lab 05/13/16 1900 05/15/16 1610 05/15/16 1613  WBC 11.4* 10.5  --   NEUTROABS 8.8* 7.2  --   HGB 13.3 13.2 14.3  HCT 40.4 42.3 42.0  MCV 94.3 97.0  --   PLT 255 222  --     Cardiac Enzymes:  Recent Labs Lab 05/13/16 1900  TROPONINI <0.03    Lipid Panel: No results for input(s): CHOL, TRIG, HDL, CHOLHDL, VLDL, LDLCALC in the last 168 hours.  CBG:  Recent Labs Lab 05/13/16 1811  GLUCAP 130*    Microbiology: Results for orders placed or performed during the hospital encounter of 05/13/16  Urine culture     Status: Abnormal   Collection Time: 05/13/16  8:27 PM  Result Value Ref Range Status   Specimen Description URINE, CLEAN CATCH  Final   Special Requests NONE  Final   Culture MULTIPLE SPECIES PRESENT, SUGGEST RECOLLECTION (A)  Final   Report Status 05/15/2016 FINAL  Final     Imaging: Ct Head Wo Contrast  05/15/2016  ADDENDUM REPORT: 05/15/2016 16:41 ADDENDUM: I signed off the report prior to speaking with Dr. Silverio Decamp. These results were discussed directly with Dr. Roderic Palau of the emergency department by telephone 05/15/2016 at 4:40 p.m. Electronically Signed   By: Evangeline Dakin M.D.   On: 05/15/2016 16:41  05/15/2016  CLINICAL DATA:  65 year old with  uncontrollable shaking and dysarthria. Code stroke. EXAM: CT HEAD WITHOUT CONTRAST TECHNIQUE: Contiguous axial images were obtained from the base of the skull through the vertex without intravenous contrast. COMPARISON:  Head CT 05/13/2016, 11/28/2014. MRI brain 05/03/2015, 12/24/2014. FINDINGS: Mild  age related cortical atrophy, unchanged. Ventricular system normal in size and appearance for age. No mass lesion. No midline shift. No acute hemorrhage or hematoma. No extra-axial fluid collections. No evidence of acute infarction. No skull fracture or other focal osseous abnormality involving the skull. Visualized paranasal sinuses, bilateral mastoid air cells and bilateral middle ear cavities well-aerated. Moderate bilateral carotid siphon atherosclerosis. IMPRESSION: 1. No acute intracranial abnormality. 2. Stable mild age related cortical atrophy. Critical Value/emergent results were called by telephone at the time of interpretation on 05/15/2016 at 4:19 pm to Dr. Silverio Decamp of the Stroke Neurology service, who verbally acknowledged these results. Electronically Signed: By: Evangeline Dakin M.D. On: 05/15/2016 16:22   Ct Head Wo Contrast  05/13/2016  CLINICAL DATA:  Altered mental status.  Weakness EXAM: CT HEAD WITHOUT CONTRAST TECHNIQUE: Contiguous axial images were obtained from the base of the skull through the vertex without intravenous contrast. COMPARISON:  MRI 05/03/2015 FINDINGS: Mild atrophy.  Negative for hydrocephalus. Negative for acute infarct.  Negative for acute hemorrhage or mass. Negative calvarium. IMPRESSION: Mild atrophy.  No acute abnormality. Electronically Signed   By: Franchot Gallo M.D.   On: 05/13/2016 18:41   Dg Chest Port 1 View  05/13/2016  CLINICAL DATA:  Weakness EXAM: PORTABLE CHEST 1 VIEW COMPARISON:  None. FINDINGS: The heart size and mediastinal contours are within normal limits. Both lungs are clear. The visualized skeletal structures are unremarkable. IMPRESSION: No active disease. Electronically Signed   By: Inez Catalina M.D.   On: 05/13/2016 19:15     Assessment and plan:   Valerie Hall is an 65 y.o. female patient who presented with psychogenic tremulousness in her limbs as well as her pelvic up-and-down movements on the bed, grossly nonfocal neurological  examination. No clinical evidence for an acute stroke. Hence not a candidate for IV  tPA. CT of the head showed no acute pathology.  No further neurological workup indicated at this time. We will sign off.  Discussed with ER physician. Defer any further medical and psychiatric workup or management to the ER providers.

## 2016-05-15 NOTE — Discharge Instructions (Signed)
Follow up with your md if needed

## 2016-05-15 NOTE — ED Notes (Signed)
Pt noted as shaking in rhythmic movement that she can control to move extremities and core.

## 2016-05-15 NOTE — ED Notes (Signed)
Family at bedside. 

## 2016-05-15 NOTE — Code Documentation (Signed)
65yo female arriving to [facility] via [place] at [phone].  Patient with reported uncontrollable shaking all day and then sudden onset difficulty speaking at 1400 witnessed by family.  Of note, patient with a recent ED visit at [facility] on Sunday for similar presentation and found to have hypokalemia.  Patient reporting nausea and given Zofran en route.  Stroke team at the bedside on patient arrival.  Labs drawn and patient cleared for CT by EDP.  Patient to CT with team.  CT completed.  Patient to A6.  NIHSS 7, see documentation for details and code stroke times.  Patient with slow, hesitant speech and bilateral leg weakness, however, patient resists examiner during testing and has poor effort on exam.  Patient also missed the month and her age.  Patient with intermittent shaking during exam, however, shaking subsided during CT exam.  Dr. Silverio Decamp to the bedside.  Code stroke canceled.  Bedside handoff with ED RN Millie.

## 2016-05-15 NOTE — ED Notes (Signed)
Cancelled Code Stroke

## 2016-05-15 NOTE — ED Notes (Signed)
Pt c/o headache similar to regular headache but seeing "sparkles". Maew resp even and non laborted butr moans intermittently.

## 2016-05-15 NOTE — ED Notes (Signed)
Shakiing motion has stopped.Pt moves all extremities and turns self in bed,. Speech is slow but clear.

## 2016-05-15 NOTE — ED Notes (Signed)
Pt staes "my ears are under water". Family at bedside

## 2016-05-15 NOTE — ED Provider Notes (Signed)
CSN: [id number]     Arrival date & time 05/15/16  1602 History   First MD Initiated Contact with Patient 05/15/16 1611     Chief Complaint  Patient presents with  . Code Stroke    An emergency department physician performed an initial assessment on this suspected stroke patient at 9. (Consider location/radiation/quality/duration/timing/severity/associated sxs/prior Treatment) Patient is a 65 y.o. female presenting with general illness. The history is provided by the patient and the EMS personnel (Patient presented with shaking of arms and legs abdominal pain and headache.).  Illness Severity:  Moderate Onset quality:  Sudden Timing:  Constant Progression:  Waxing and waning Chronicity:  Recurrent Associated symptoms: abdominal pain and headaches   Associated symptoms: no chest pain, no congestion, no cough, no diarrhea, no fatigue and no rash     Past Medical History  Diagnosis Date  . Meniere's disease   . Chronic fatigue syndrome   . Chronic pain syndrome   . Chronic abdominal pain   . DDD (degenerative disc disease), cervical   . Crohn's disease ([place])   . HNP (herniated nucleus pulposus), lumbar   . Palpitations   . IBS (irritable bowel syndrome)   . Chronic abdominal pain   . Depression   . Hyperthyroidism     Per Pt, elevated thyroid levels for 4 months +  . Hypothyroidism     Hx of hypothyroidism, 10 years +   Past Surgical History  Procedure Laterality Date  . Appendectomy    . Abdominal hysterectomy     Family History  Problem Relation Age of Onset  . Hypertension Mother   . Diabetes Mother   . CAD Mother   . Cancer Mother   . Hypertension Father   . Diabetes Father   . CAD Father   . Cancer Father   . Heart attack Father   . Hypertension Brother    Social History  Substance Use Topics  . Smoking status: Never Smoker   . Smokeless tobacco: Never Used  . Alcohol Use: No   OB History    No data available     Review of Systems   Constitutional: Negative for appetite change and fatigue.  HENT: Negative for congestion, ear discharge and sinus pressure.   Eyes: Negative for discharge.  Respiratory: Negative for cough.   Cardiovascular: Negative for chest pain.  Gastrointestinal: Positive for abdominal pain. Negative for diarrhea.  Genitourinary: Negative for frequency and hematuria.  Musculoskeletal: Negative for back pain.  Skin: Negative for rash.  Neurological: Positive for headaches. Negative for seizures.  Psychiatric/Behavioral: Negative for hallucinations.      Allergies  Codeine and Prochlorperazine  Home Medications   Prior to Admission medications   Medication Sig Start Date End Date Taking? Authorizing Provider  acetaZOLAMIDE (DIAMOX) 500 MG capsule Take by mouth. 12/28/15  Yes Historical Provider, MD  amitriptyline (ELAVIL) 150 MG tablet Take 150 mg by mouth at bedtime.   Yes Historical Provider, MD  atenolol (TENORMIN) 100 MG tablet Take 90 mg by mouth daily.   Yes Historical Provider, MD  cholecalciferol (VITAMIN D) 400 UNITS TABS tablet Take 2,000 Units by mouth.   Yes Historical Provider, MD  clonazePAM (KLONOPIN) 0.5 MG tablet Take by mouth 2 (two) times daily as needed for anxiety.   Yes Historical Provider, MD  cyclobenzaprine (FLEXERIL) 10 MG tablet Take 10 mg by mouth 3 (three) times daily as needed for muscle spasms. Reported on 04/30/2016   Yes Historical Provider, MD  diazepam (VALIUM) 5  MG tablet Take 5 mg by mouth every 6 (six) hours as needed for anxiety. Reported on 04/30/2016   Yes Historical Provider, MD  gabapentin (NEURONTIN) 600 MG tablet Take 600 mg by mouth 3 (three) times daily.   Yes Historical Provider, MD  levothyroxine (SYNTHROID, LEVOTHROID) 100 MCG tablet Take 100 mcg by mouth daily before breakfast. Reported on 04/30/2016   Yes Historical Provider, MD  meperidine (DEMEROL) 50 MG tablet TAKE 1/2 TABLET BY MOUTH 3 TIMES A DAY 03/23/16  Yes Historical Provider, MD  mirtazapine  (REMERON) 15 MG tablet Take 15 mg by mouth at bedtime. Reported on 04/30/2016   Yes Historical Provider, MD  omeprazole (PRILOSEC) 20 MG capsule Take 20 mg by mouth daily. Reported on 04/30/2016   Yes Historical Provider, MD  potassium chloride SA (K-DUR,KLOR-CON) 20 MEQ tablet Take 2 tablets (40 mEq total) by mouth daily. Patient taking differently: Take 20 mEq by mouth daily.  05/13/16 05/13/17 Yes Schuyler Amor, MD  predniSONE (DELTASONE) 20 MG tablet Take 68m daily x 2 weeks, 323mdaily x 2 weeks, 2042maily x 2 weeks, then 47m31mily x 2 weeks 05/03/16  Yes Darren WohlAllen Norris   BP 146/67 mmHg  Pulse 74  Temp(Src) 101 F (38.3 C) (Rectal)  Resp 16  Ht 5' 5"  (1.651 m)  Wt 170 lb (77.111 kg)  BMI 28.29 kg/m2  SpO2 100% Physical Exam  Constitutional: She is oriented to person, place, and time. She appears well-developed.  HENT:  Head: Normocephalic.  Eyes: Conjunctivae and EOM are normal. No scleral icterus.  Neck: Neck supple. No thyromegaly present.  Cardiovascular: Normal rate and regular rhythm.  Exam reveals no gallop and no friction rub.   No murmur heard. Pulmonary/Chest: No stridor. She has no wheezes. She has no rales. She exhibits no tenderness.  Abdominal: She exhibits no distension. There is tenderness. There is no rebound.  Tender suprapubic  Musculoskeletal: Normal range of motion. She exhibits no edema.  Lymphadenopathy:    She has no cervical adenopathy.  Neurological: She is oriented to person, place, and time. She exhibits normal muscle tone. Coordination normal.  Skin: No rash noted. No erythema.  Psychiatric: She has a normal mood and affect. Her behavior is normal.    ED Course  Procedures (including critical care time) Labs Review Labs Reviewed  COMPREHENSIVE METABOLIC PANEL - Abnormal; Notable for the following:    CO2 20 (*)    Glucose, Bld 109 (*)    Creatinine, Ser 1.11 (*)    Total Protein 6.3 (*)    GFR calc non Af Amer 51 (*)    GFR calc Af Amer 59  (*)    All other components within normal limits  URINE RAPID DRUG SCREEN, HOSP PERFORMED - Abnormal; Notable for the following:    Benzodiazepines POSITIVE (*)    All other components within normal limits  URINALYSIS, ROUTINE W REFLEX MICROSCOPIC [facility]; Notable for the following:    Ketones, ur 15 (*)    All other components within normal limits  I-STAT CHEM 8, ED - Abnormal; Notable for the following:    BUN 4 (*)    Glucose, Bld 113 (*)    Calcium, Ion 1.01 (*)    All other components within normal limits  ETHANOL  PROTIME-INR  APTT  CBC  DIFFERENTIAL  I-STAT TROPOININ, ED    Imaging Review Ct Head Wo Contrast  05/15/2016  ADDENDUM REPORT: 05/15/2016 16:41 ADDENDUM: I signed off the  report prior to speaking with Dr. Silverio Decamp. These results were discussed directly with Dr. Roderic Palau of the emergency department by telephone 05/15/2016 at 4:40 p.m. Electronically Signed   By: Evangeline Dakin M.D.   On: 05/15/2016 16:41  05/15/2016  CLINICAL DATA:  65 year old with uncontrollable shaking and dysarthria. Code stroke. EXAM: CT HEAD WITHOUT CONTRAST TECHNIQUE: Contiguous axial images were obtained from the base of the skull through the vertex without intravenous contrast. COMPARISON:  Head CT 05/13/2016, 11/28/2014. MRI brain 05/03/2015, 12/24/2014. FINDINGS: Mild age related cortical atrophy, unchanged. Ventricular system normal in size and appearance for age. No mass lesion. No midline shift. No acute hemorrhage or hematoma. No extra-axial fluid collections. No evidence of acute infarction. No skull fracture or other focal osseous abnormality involving the skull. Visualized paranasal sinuses, bilateral mastoid air cells and bilateral middle ear cavities well-aerated. Moderate bilateral carotid siphon atherosclerosis. IMPRESSION: 1. No acute intracranial abnormality. 2. Stable mild age related cortical atrophy. Critical Value/emergent results were called by telephone at the time  of interpretation on 05/15/2016 at 4:19 pm to Dr. Silverio Decamp of the Stroke Neurology service, who verbally acknowledged these results. Electronically Signed: By: Evangeline Dakin M.D. On: 05/15/2016 16:22   I have personally reviewed and evaluated these images and lab results as part of my medical decision-making.   EKG Interpretation   Date/Time:  Tuesday May 15 2016 16:49:45 EDT Ventricular Rate:  84 PR Interval:  128 QRS Duration: 104 QT Interval:  392 QTC Calculation: 463 R Axis:   13 Text Interpretation:  Sinus rhythm Borderline T abnormalities, anterior  leads Confirmed by Amorie Rentz  MD, Alaila Pillard ([id number]) on 05/15/2016 10:24:49 PM      MDM   Final diagnoses:  Abdominal pain in female    Labs unremarkable CT scan negative. Patient was seen by cardiology who felt she was having anxiety. Her first exam showed she had some tenderness suprapubic. At discharge patient was no longer having headaches or abdominal discomfort. Suspect anxiety with her chronic fatigue and abdominal discomfort exacerbation. Patient will follow up PCP   Milton Ferguson, MD 05/15/16 2234

## 2016-05-15 NOTE — ED Notes (Signed)
Pt arrives EMS direct to CT. Pt had all over shaking all day and then c/o slurred speech. C/o pain at temples. Seen on Sunday at [facility] for similar symptoms.

## 2016-05-22 ENCOUNTER — Other Ambulatory Visit: Payer: Self-pay

## 2016-05-22 ENCOUNTER — Telehealth: Payer: Self-pay | Admitting: Gastroenterology

## 2016-05-22 NOTE — Telephone Encounter (Signed)
Per Dr. Doy Hutching patient needs a colonoscopy ASAP. She stated she has been in the hospital several times and is very weak. Patient would like to talk to you today.

## 2016-05-22 NOTE — Telephone Encounter (Signed)
colonoscopy

## 2016-05-22 NOTE — Telephone Encounter (Signed)
Dr. Doy Hutching would like pt to have a colonoscopy because pt is having a lot of diarrhea and is very weak. Pt is worried that having this with the bowel prep will make her worse. Pt had an elevated CRP 3 weeks ago and was given some prednisone. Please review chart and let me know if this will be an appropriate step. I recommended we repeat her CRP to see if she still is having a flare. Please let me know.

## 2016-05-22 NOTE — Telephone Encounter (Signed)
The patient know what Dr. Doy Hutching requested and if she does not want to go through with it than we won't and we will inform him of her decision.  If she wants to go through the blood test for CRP we can check that again.  If it is still high she may need to be started on Cimzia or Humira

## 2016-05-23 ENCOUNTER — Other Ambulatory Visit: Payer: Self-pay

## 2016-05-23 DIAGNOSIS — K50919 Crohn's disease, unspecified, with unspecified complications: Secondary | ICD-10-CM

## 2016-05-23 NOTE — Telephone Encounter (Signed)
Spoke with pt about her symptoms. She feels she is too weak to go through with the colonoscopy. She would like to see if she does have an active crohn's flare. If she doesn't, we will move forward with the colonoscopy. Pt will go tomorrow for lab.

## 2016-05-29 LAB — C-REACTIVE PROTEIN: CRP: 4 mg/L (ref 0.0–4.9)

## 2016-05-31 ENCOUNTER — Telehealth: Payer: Self-pay

## 2016-05-31 NOTE — Telephone Encounter (Signed)
Pt notified of lab results

## 2016-05-31 NOTE — Telephone Encounter (Signed)
-----   Message from Lucilla Lame, MD sent at 05/29/2016  8:05 AM EDT ----- Let the patient know her CRP is back to normal.

## 2016-06-25 ENCOUNTER — Telehealth: Payer: Self-pay

## 2016-06-25 ENCOUNTER — Other Ambulatory Visit: Payer: Self-pay

## 2016-06-25 NOTE — Telephone Encounter (Signed)
Called patient to schedule her colonoscopy. Colonoscopy is scheduled at the [facility] 07/09/2016. Orders have been completed and letter has been mailed out.

## 2016-07-02 ENCOUNTER — Encounter: Payer: Self-pay | Admitting: *Deleted

## 2016-07-03 ENCOUNTER — Telehealth: Payer: Self-pay

## 2016-07-03 NOTE — Telephone Encounter (Signed)
Pt rememered that two years ago, Dr. Allen Norris removed polyps. She bleed a lot after the removal. She had to go back again to do another procedure to stop the bleeding. She is requesting that if he romoves any polyps this time around, to stop the bleeding during that time. She also said that she gets nauseas after procedures. I let her know that I will make note of this and for her to mention this again when they call her for her pre-op.

## 2016-07-06 NOTE — Discharge Instructions (Signed)

## 2016-07-09 ENCOUNTER — Ambulatory Visit
Admission: RE | Admit: 2016-07-09 | Discharge: 2016-07-09 | Disposition: A | Discharge status: Home / Self Care | Payer: Medicare HMO | Source: Ambulatory Visit | Attending: Gastroenterology | Admitting: Gastroenterology

## 2016-07-09 ENCOUNTER — Encounter
Admission: RE | Disposition: A | Discharge status: Home / Self Care | Payer: Self-pay | Source: Ambulatory Visit | Attending: Gastroenterology

## 2016-07-09 ENCOUNTER — Ambulatory Visit: Payer: Medicare HMO | Admitting: Anesthesiology

## 2016-07-09 DIAGNOSIS — M5030 Other cervical disc degeneration, unspecified cervical region: Secondary | ICD-10-CM | POA: Diagnosis not present

## 2016-07-09 DIAGNOSIS — Z833 Family history of diabetes mellitus: Secondary | ICD-10-CM | POA: Insufficient documentation

## 2016-07-09 DIAGNOSIS — Z8249 Family history of ischemic heart disease and other diseases of the circulatory system: Secondary | ICD-10-CM | POA: Diagnosis not present

## 2016-07-09 DIAGNOSIS — Z809 Family history of malignant neoplasm, unspecified: Secondary | ICD-10-CM | POA: Insufficient documentation

## 2016-07-09 DIAGNOSIS — Z885 Allergy status to narcotic agent status: Secondary | ICD-10-CM | POA: Diagnosis not present

## 2016-07-09 DIAGNOSIS — R51 Headache: Secondary | ICD-10-CM | POA: Insufficient documentation

## 2016-07-09 DIAGNOSIS — K589 Irritable bowel syndrome without diarrhea: Secondary | ICD-10-CM | POA: Diagnosis not present

## 2016-07-09 DIAGNOSIS — D123 Benign neoplasm of transverse colon: Secondary | ICD-10-CM | POA: Diagnosis not present

## 2016-07-09 DIAGNOSIS — G894 Chronic pain syndrome: Secondary | ICD-10-CM | POA: Diagnosis not present

## 2016-07-09 DIAGNOSIS — E0590 Thyrotoxicosis, unspecified without thyrotoxic crisis or storm: Secondary | ICD-10-CM | POA: Insufficient documentation

## 2016-07-09 DIAGNOSIS — Z860100 Personal history of colon polyps, unspecified: Secondary | ICD-10-CM

## 2016-07-09 DIAGNOSIS — R5382 Chronic fatigue, unspecified: Secondary | ICD-10-CM | POA: Insufficient documentation

## 2016-07-09 DIAGNOSIS — K5090 Crohn's disease, unspecified, without complications: Secondary | ICD-10-CM | POA: Diagnosis not present

## 2016-07-09 DIAGNOSIS — F329 Major depressive disorder, single episode, unspecified: Secondary | ICD-10-CM | POA: Diagnosis not present

## 2016-07-09 DIAGNOSIS — I10 Essential (primary) hypertension: Secondary | ICD-10-CM | POA: Insufficient documentation

## 2016-07-09 DIAGNOSIS — Z888 Allergy status to other drugs, medicaments and biological substances status: Secondary | ICD-10-CM | POA: Insufficient documentation

## 2016-07-09 DIAGNOSIS — H8109 Meniere's disease, unspecified ear: Secondary | ICD-10-CM | POA: Diagnosis not present

## 2016-07-09 DIAGNOSIS — M5126 Other intervertebral disc displacement, lumbar region: Secondary | ICD-10-CM | POA: Insufficient documentation

## 2016-07-09 DIAGNOSIS — Z79899 Other long term (current) drug therapy: Secondary | ICD-10-CM | POA: Insufficient documentation

## 2016-07-09 DIAGNOSIS — Z90710 Acquired absence of both cervix and uterus: Secondary | ICD-10-CM | POA: Diagnosis not present

## 2016-07-09 DIAGNOSIS — K219 Gastro-esophageal reflux disease without esophagitis: Secondary | ICD-10-CM | POA: Insufficient documentation

## 2016-07-09 DIAGNOSIS — M1990 Unspecified osteoarthritis, unspecified site: Secondary | ICD-10-CM | POA: Insufficient documentation

## 2016-07-09 DIAGNOSIS — K6389 Other specified diseases of intestine: Secondary | ICD-10-CM | POA: Insufficient documentation

## 2016-07-09 DIAGNOSIS — Z1211 Encounter for screening for malignant neoplasm of colon: Secondary | ICD-10-CM | POA: Insufficient documentation

## 2016-07-09 DIAGNOSIS — K640 First degree hemorrhoids: Secondary | ICD-10-CM | POA: Diagnosis not present

## 2016-07-09 DIAGNOSIS — E039 Hypothyroidism, unspecified: Secondary | ICD-10-CM | POA: Diagnosis not present

## 2016-07-09 HISTORY — DX: Essential (primary) hypertension: I10

## 2016-07-09 HISTORY — DX: Gastro-esophageal reflux disease without esophagitis: K21.9

## 2016-07-09 HISTORY — PX: COLONOSCOPY WITH PROPOFOL: SHX5780

## 2016-07-09 HISTORY — PX: POLYPECTOMY: SHX5525

## 2016-07-09 HISTORY — DX: Headache: R51

## 2016-07-09 HISTORY — DX: Presence of dental prosthetic device (complete) (partial): Z97.2

## 2016-07-09 HISTORY — DX: Headache, unspecified: R51.9

## 2016-07-09 SURGERY — COLONOSCOPY WITH PROPOFOL
Anesthesia: Monitor Anesthesia Care | Wound class: Contaminated

## 2016-07-09 MED ORDER — LACTATED RINGERS IV SOLN
INTRAVENOUS | Status: DC
  Administered 2016-07-09: 10:00:00 via INTRAVENOUS

## 2016-07-09 MED ORDER — STERILE WATER FOR IRRIGATION IR SOLN
Status: DC | PRN
  Administered 2016-07-09: 10:00:00

## 2016-07-09 MED ORDER — LIDOCAINE HCL (CARDIAC) 20 MG/ML IV SOLN
INTRAVENOUS | Status: DC | PRN
  Administered 2016-07-09: 40 mg via INTRAVENOUS

## 2016-07-09 MED ORDER — PROPOFOL 10 MG/ML IV BOLUS
INTRAVENOUS | Status: DC | PRN
  Administered 2016-07-09: 50 mg via INTRAVENOUS
  Administered 2016-07-09 (×2): 20 mg via INTRAVENOUS
  Administered 2016-07-09: 50 mg via INTRAVENOUS
  Administered 2016-07-09: 20 mg via INTRAVENOUS
  Administered 2016-07-09: 50 mg via INTRAVENOUS

## 2016-07-09 MED ORDER — FENTANYL CITRATE (PF) 100 MCG/2ML IJ SOLN: 25.0000 ug | INTRAMUSCULAR | Status: DC | PRN

## 2016-07-09 SURGICAL SUPPLY — 23 items
CANISTER SUCT 1200ML W/VALVE (MISCELLANEOUS) ×4 IMPLANT
CLIP HMST 235XBRD CATH ROT (MISCELLANEOUS) ×2 IMPLANT
CLIP RESOLUTION 360 11X235 (MISCELLANEOUS) ×2
FCP ESCP3.2XJMB 240X2.8X (MISCELLANEOUS)
FORCEPS BIOP RAD 4 LRG CAP 4 (CUTTING FORCEPS) IMPLANT
FORCEPS BIOP RJ4 240 W/NDL (MISCELLANEOUS)
FORCEPS ESCP3.2XJMB 240X2.8X (MISCELLANEOUS) IMPLANT
GOWN CVR UNV OPN BCK APRN NK (MISCELLANEOUS) ×4 IMPLANT
GOWN ISOL THUMB LOOP REG UNIV (MISCELLANEOUS) ×4
INJECTOR VARIJECT VIN23 (MISCELLANEOUS) IMPLANT
KIT DEFENDO VALVE AND CONN (KITS) IMPLANT
KIT ENDO PROCEDURE OLY (KITS) ×4 IMPLANT
MARKER SPOT ENDO TATTOO 5ML (MISCELLANEOUS) IMPLANT
PAD GROUND ADULT SPLIT (MISCELLANEOUS) IMPLANT
PROBE APC STR FIRE (PROBE) IMPLANT
RETRIEVER NET ROTH 2.5X230 LF (MISCELLANEOUS) ×4 IMPLANT
SNARE SHORT THROW 13M SML OVAL (MISCELLANEOUS) ×4 IMPLANT
SNARE SHORT THROW 30M LRG OVAL (MISCELLANEOUS) IMPLANT
SNARE SNG USE RND 15MM (INSTRUMENTS) IMPLANT
SPOT EX ENDOSCOPIC TATTOO (MISCELLANEOUS)
TRAP ETRAP POLY (MISCELLANEOUS) ×4 IMPLANT
VARIJECT INJECTOR VIN23 (MISCELLANEOUS)
WATER STERILE IRR 250ML POUR (IV SOLUTION) ×4 IMPLANT

## 2016-07-09 NOTE — Transfer of Care (Signed)
Immediate Anesthesia Transfer of Care Note  Patient: Valerie Hall  Procedure(s) Performed: Procedure(s): COLONOSCOPY WITH PROPOFOL (N/A) POLYPECTOMY  Patient Location: PACU  Anesthesia Type: MAC  Level of Consciousness: awake, alert  and patient cooperative  Airway and Oxygen Therapy: Patient Spontanous Breathing and Patient connected to supplemental oxygen  Post-op Assessment: Post-op Vital signs reviewed, Patient's Cardiovascular Status Stable, Respiratory Function Stable, Patent Airway and No signs of Nausea or vomiting  Post-op Vital Signs: Reviewed and stable  Complications: No apparent anesthesia complications

## 2016-07-09 NOTE — Op Note (Signed)
[facility] Gastroenterology Patient Name: Valerie Hall Procedure Date: 07/09/2016 9:40 AM MRN: [id number] Account #: [id number] Date of Birth: 04/21/1951 Admit Type: Outpatient Age: 65 Room: [facility] OR ROOM 01 Gender: Female Note Status: Finalized Procedure:            Colonoscopy Indications:          High risk colon cancer surveillance: Personal history                        of colonic polyps Providers:            Lucilla Lame MD, MD Referring MD:         Leonie Douglas. Doy Hutching, MD (Referring MD) Complications:        No immediate complications. Procedure:            Pre-Anesthesia Assessment:                       - Prior to the procedure, a History and Physical was                        performed, and patient medications and allergies were                        reviewed. The patient's tolerance of previous                        anesthesia was also reviewed. The risks and benefits of                        the procedure and the sedation options and risks were                        discussed with the patient. All questions were                        answered, and informed consent was obtained. Prior                        Anticoagulants: The patient has taken no previous                        anticoagulant or antiplatelet agents. ASA Grade                        Assessment: II - A patient with mild systemic disease.                        After reviewing the risks and benefits, the patient was                        deemed in satisfactory condition to undergo the                        procedure.                       After obtaining informed consent, the colonoscope was                        passed under direct vision. Throughout  the procedure,                        the patient's blood pressure, pulse, and oxygen                        saturations were monitored continuously. The Olympus CF                        H180AL colonoscope (S#: [id number]) was  introduced through                        the anus and advanced to the the cecum, identified by                        appendiceal orifice and ileocecal valve. The                        colonoscopy was performed without difficulty. The                        patient tolerated the procedure well. The quality of                        the bowel preparation was excellent. Findings:      Five sessile polyps were found in the transverse colon. The polyps were       4 to 9 mm in size. These polyps were removed with a cold snare.       Resection and retrieval were complete. To prevent bleeding       post-intervention, one hemostatic clip was successfully placed (MR       conditional). There was no bleeding at the end of the procedure.      Non-bleeding internal hemorrhoids were found during retroflexion. The       hemorrhoids were Grade I (internal hemorrhoids that do not prolapse). Impression:           - Five 4 to 9 mm polyps in the transverse colon,                        removed with a cold snare. Resected and retrieved. Clip                        (MR conditional) was placed.                       - Non-bleeding internal hemorrhoids. Recommendation:       - Repeat colonoscopy in 3 years for surveillance. Procedure Code(s):    --- Professional ---                       [phone], Colonoscopy, flexible; with removal of tumor(s),                        polyp(s), or other lesion(s) by snare technique Diagnosis Code(s):    --- Professional ---                       Z86.010, Personal history of colonic polyps  D12.3, Benign neoplasm of transverse colon (hepatic                        flexure or splenic flexure) CPT copyright 2016 American Medical Association. All rights reserved. The codes documented in this report are preliminary and upon coder review may  be revised to meet current compliance requirements. Lucilla Lame MD, MD 07/09/2016 10:11:09 AM This report has been signed  electronically. Number of Addenda: 0 Note Initiated On: 07/09/2016 9:40 AM Scope Withdrawal Time: 0 hours 9 minutes 44 seconds  Total Procedure Duration: 0 hours 16 minutes 31 seconds       [facility]

## 2016-07-09 NOTE — Anesthesia Preprocedure Evaluation (Signed)
Anesthesia Evaluation    Airway Mallampati: III  TM Distance: >3 FB Neck ROM: Full    Dental no notable dental hx. (+) Edentulous Upper,    Pulmonary neg pulmonary ROS,    Pulmonary exam normal breath sounds clear to auscultation       Cardiovascular hypertension, Normal cardiovascular exam Rhythm:Regular Rate:Normal     Neuro/Psych  Headaches, Meniere's Chronic pain  Neuromuscular disease    GI/Hepatic Neg liver ROS, GERD  Controlled,  Endo/Other  Hypothyroidism   Renal/GU negative Renal ROS     Musculoskeletal  (+) Arthritis ,   Abdominal   Peds  Hematology negative hematology ROS (+)   Anesthesia Other Findings   Reproductive/Obstetrics                             Anesthesia Physical Anesthesia Plan  ASA: II  Anesthesia Plan: MAC   Post-op Pain Management:    Induction: Intravenous  Airway Management Planned:   Additional Equipment:   Intra-op Plan:   Post-operative Plan: Extubation in OR  Informed Consent: I have reviewed the patients History and Physical, chart, labs and discussed the procedure including the risks, benefits and alternatives for the proposed anesthesia with the patient or authorized representative who has indicated his/her understanding and acceptance.   Dental advisory given  Plan Discussed with: CRNA  Anesthesia Plan Comments:         Anesthesia Quick Evaluation

## 2016-07-09 NOTE — Anesthesia Postprocedure Evaluation (Signed)
Anesthesia Post Note  Patient: Valerie Hall  Procedure(s) Performed: Procedure(s) (LRB): COLONOSCOPY WITH PROPOFOL (N/A) POLYPECTOMY  Patient location during evaluation: PACU Anesthesia Type: MAC Level of consciousness: awake and alert Pain management: pain level controlled Vital Signs Assessment: post-procedure vital signs reviewed and stable Respiratory status: spontaneous breathing, nonlabored ventilation, respiratory function stable and patient connected to nasal cannula oxygen Cardiovascular status: stable and blood pressure returned to baseline Anesthetic complications: no    Tatayana Beshears C
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
## 2016-07-10 ENCOUNTER — Encounter: Payer: Self-pay | Admitting: Gastroenterology

## 2016-08-29 ENCOUNTER — Telehealth: Payer: Self-pay

## 2016-08-29 ENCOUNTER — Other Ambulatory Visit: Payer: Self-pay

## 2016-08-29 DIAGNOSIS — R197 Diarrhea, unspecified: Secondary | ICD-10-CM

## 2016-08-29 NOTE — Telephone Encounter (Signed)
Pt called today stating she is having a lot of diarrhea again with severe abdominal pain. No blood in stool, nausea or vomiting or fever but she has had chills. She hasn't taken her temperature. She was given Pentasa before but it makes her feel bad. We ended up giving her a rx for Prednisone for an elevated CRP. I have ordered the CRP for her to see if her husband will take her to the lab on Friday, but he isn't cooperative with her per pt. She has Lomotil but she has to take a lot and it is not working at the moment. Pt had a colonoscopy at the end of July with only tubular adenoma and melanosis. Please advise about any medications.

## 2016-08-30 NOTE — Telephone Encounter (Signed)
That start her on 9 mg of budesonide every day.   Give her 30 day supply with 3 refills

## 2016-08-31 ENCOUNTER — Other Ambulatory Visit: Payer: Self-pay

## 2016-08-31 DIAGNOSIS — K5000 Crohn's disease of small intestine without complications: Secondary | ICD-10-CM

## 2016-08-31 MED ORDER — BUDESONIDE 3 MG PO CPEP: 9.0000 mg | ORAL_CAPSULE | ORAL | 3 refills | Status: DC

## 2016-08-31 NOTE — Telephone Encounter (Signed)
Left vm letting pt know a prescription for Budesonide will be at the pharmacy waiting for her to pick up.

## 2016-09-01 LAB — C-REACTIVE PROTEIN: CRP: 4.7 mg/L (ref 0.0–4.9)

## 2016-09-04 ENCOUNTER — Telehealth: Payer: Self-pay

## 2016-09-04 NOTE — Telephone Encounter (Signed)
Tried contacting pt but phone was not accepting calls.

## 2016-09-04 NOTE — Telephone Encounter (Signed)
-----   Message from Lucilla Lame, MD sent at 09/03/2016  1:37 PM EDT ----- Let the patient know that the CRP was negative

## 2016-09-05 NOTE — Telephone Encounter (Signed)
Pt notified of results

## 2016-09-12 ENCOUNTER — Telehealth: Payer: Self-pay

## 2016-09-12 NOTE — Telephone Encounter (Signed)
Valerie Hall called wanting to know if it's okay if she increased her intake on the budesonide. She states that it's working a little bit. I spoke with Ginger and told her that it is not okay to do that. I made sure that she was taking 3 capsules all together daily and she is. I advised her to take Imodium with her Budesonide. Patient understood.

## 2017-05-06 ENCOUNTER — Other Ambulatory Visit: Payer: Self-pay | Admitting: Internal Medicine

## 2017-05-06 DIAGNOSIS — N644 Mastodynia: Secondary | ICD-10-CM

## 2017-06-12 ENCOUNTER — Ambulatory Visit
Admission: RE | Admit: 2017-06-12 | Discharge: 2017-06-12 | Disposition: A | Discharge status: Home / Self Care | Payer: Medicare HMO | Source: Ambulatory Visit | Attending: Internal Medicine | Admitting: Internal Medicine

## 2017-06-12 DIAGNOSIS — N644 Mastodynia: Secondary | ICD-10-CM

## 2017-08-06 ENCOUNTER — Other Ambulatory Visit: Payer: Self-pay | Admitting: Internal Medicine

## 2017-08-06 DIAGNOSIS — R1084 Generalized abdominal pain: Secondary | ICD-10-CM

## 2017-08-14 ENCOUNTER — Ambulatory Visit
Admission: RE | Admit: 2017-08-14 | Discharge: 2017-08-14 | Disposition: A | Discharge status: Home / Self Care | Payer: Medicare HMO | Source: Ambulatory Visit | Attending: Internal Medicine | Admitting: Internal Medicine

## 2017-08-14 DIAGNOSIS — R1084 Generalized abdominal pain: Secondary | ICD-10-CM | POA: Insufficient documentation

## 2017-08-14 MED ORDER — IOPAMIDOL (ISOVUE-300) INJECTION 61%
80.0000 mL | Freq: Once | INTRAVENOUS | Status: AC | PRN
  Administered 2017-08-14: 80 mL via INTRAVENOUS
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
## 2017-09-18 ENCOUNTER — Other Ambulatory Visit: Payer: Self-pay | Admitting: Physical Medicine and Rehabilitation

## 2017-09-18 DIAGNOSIS — M5412 Radiculopathy, cervical region: Secondary | ICD-10-CM

## 2017-09-25 ENCOUNTER — Ambulatory Visit: Payer: Medicare HMO

## 2017-10-03 ENCOUNTER — Ambulatory Visit
Admission: RE | Admit: 2017-10-03 | Discharge: 2017-10-03 | Disposition: A | Discharge status: Home / Self Care | Payer: Medicare HMO | Source: Ambulatory Visit | Attending: Physical Medicine and Rehabilitation | Admitting: Physical Medicine and Rehabilitation

## 2017-10-03 DIAGNOSIS — M4312 Spondylolisthesis, cervical region: Secondary | ICD-10-CM | POA: Insufficient documentation

## 2017-10-03 DIAGNOSIS — M5412 Radiculopathy, cervical region: Secondary | ICD-10-CM

## 2017-10-03 DIAGNOSIS — M47892 Other spondylosis, cervical region: Secondary | ICD-10-CM | POA: Diagnosis not present

## 2017-10-03 DIAGNOSIS — M4802 Spinal stenosis, cervical region: Secondary | ICD-10-CM | POA: Diagnosis not present

## 2018-04-18 ENCOUNTER — Other Ambulatory Visit: Payer: Self-pay | Admitting: Nurse Practitioner

## 2018-04-18 DIAGNOSIS — K580 Irritable bowel syndrome with diarrhea: Secondary | ICD-10-CM

## 2018-04-19 ENCOUNTER — Other Ambulatory Visit
Admission: RE | Admit: 2018-04-19 | Discharge: 2018-04-19 | Disposition: A | Discharge status: Home / Self Care | Payer: Medicare HMO | Source: Ambulatory Visit | Attending: Nurse Practitioner | Admitting: Nurse Practitioner

## 2018-04-19 DIAGNOSIS — K580 Irritable bowel syndrome with diarrhea: Secondary | ICD-10-CM | POA: Insufficient documentation

## 2018-04-19 LAB — GASTROINTESTINAL PANEL BY PCR, STOOL (REPLACES STOOL CULTURE)

## 2018-04-19 LAB — C DIFFICILE QUICK SCREEN W PCR REFLEX
C Diff antigen: NEGATIVE
C Diff interpretation: NOT DETECTED
C Diff toxin: NEGATIVE

## 2018-04-19 LAB — LACTOFERRIN, FECAL, QUALITATIVE: Lactoferrin, Fecal, Qual: NEGATIVE

## 2018-04-22 LAB — PANCREATIC ELASTASE, FECAL: Pancreatic Elastase-1, Stool: 171 ug Elast./g — ABNORMAL LOW (ref >200)

## 2018-04-23 LAB — CALPROTECTIN, FECAL: Calprotectin, Fecal: <16 ug/g (ref 0–120)

## 2018-05-07 ENCOUNTER — Ambulatory Visit
Admission: RE | Admit: 2018-05-07 | Discharge: 2018-05-07 | Disposition: A | Discharge status: Home / Self Care | Payer: Medicare HMO | Source: Ambulatory Visit | Attending: Nurse Practitioner | Admitting: Nurse Practitioner

## 2018-05-07 DIAGNOSIS — I700 Atherosclerosis of aorta: Secondary | ICD-10-CM | POA: Insufficient documentation

## 2018-05-07 DIAGNOSIS — K50919 Crohn's disease, unspecified, with unspecified complications: Secondary | ICD-10-CM | POA: Diagnosis present

## 2018-05-07 DIAGNOSIS — K580 Irritable bowel syndrome with diarrhea: Secondary | ICD-10-CM | POA: Diagnosis not present

## 2018-05-07 DIAGNOSIS — R1030 Lower abdominal pain, unspecified: Secondary | ICD-10-CM | POA: Diagnosis present

## 2018-05-07 MED ORDER — IOPAMIDOL (ISOVUE-300) INJECTION 61%
80.0000 mL | Freq: Once | INTRAVENOUS | Status: AC | PRN
  Administered 2018-05-07: 80 mL via INTRAVENOUS

## 2018-07-16 ENCOUNTER — Other Ambulatory Visit: Payer: Self-pay | Admitting: Physical Medicine and Rehabilitation

## 2018-07-16 DIAGNOSIS — M5416 Radiculopathy, lumbar region: Secondary | ICD-10-CM

## 2018-07-24 ENCOUNTER — Ambulatory Visit
Admission: RE | Admit: 2018-07-24 | Discharge: 2018-07-24 | Disposition: A | Discharge status: Home / Self Care | Payer: Medicare HMO | Source: Ambulatory Visit | Attending: Physical Medicine and Rehabilitation | Admitting: Physical Medicine and Rehabilitation

## 2018-07-24 DIAGNOSIS — M5416 Radiculopathy, lumbar region: Secondary | ICD-10-CM | POA: Insufficient documentation

## 2018-07-24 DIAGNOSIS — Q7649 Other congenital malformations of spine, not associated with scoliosis: Secondary | ICD-10-CM | POA: Diagnosis not present

## 2018-07-24 DIAGNOSIS — M4856XA Collapsed vertebra, not elsewhere classified, lumbar region, initial encounter for fracture: Secondary | ICD-10-CM | POA: Insufficient documentation

## 2018-07-24 DIAGNOSIS — M47896 Other spondylosis, lumbar region: Secondary | ICD-10-CM | POA: Diagnosis not present

## 2019-02-17 ENCOUNTER — Encounter: Payer: Self-pay | Admitting: *Deleted

## 2019-02-18 ENCOUNTER — Ambulatory Visit: Payer: Medicare HMO | Admitting: Certified Registered Nurse Anesthetist

## 2019-02-18 ENCOUNTER — Ambulatory Visit
Admission: RE | Admit: 2019-02-18 | Discharge: 2019-02-18 | Disposition: A | Discharge status: Home / Self Care | Payer: Medicare HMO | Attending: Unknown Physician Specialty | Admitting: Unknown Physician Specialty

## 2019-02-18 ENCOUNTER — Other Ambulatory Visit: Payer: Self-pay

## 2019-02-18 ENCOUNTER — Encounter: Payer: Self-pay | Admitting: *Deleted

## 2019-02-18 ENCOUNTER — Encounter
Admission: RE | Disposition: A | Discharge status: Home / Self Care | Payer: Self-pay | Source: Home / Self Care | Attending: Unknown Physician Specialty

## 2019-02-18 DIAGNOSIS — Z8673 Personal history of transient ischemic attack (TIA), and cerebral infarction without residual deficits: Secondary | ICD-10-CM | POA: Diagnosis not present

## 2019-02-18 DIAGNOSIS — Z86010 Personal history of colonic polyps: Secondary | ICD-10-CM | POA: Diagnosis not present

## 2019-02-18 DIAGNOSIS — Z79890 Hormone replacement therapy (postmenopausal): Secondary | ICD-10-CM | POA: Diagnosis not present

## 2019-02-18 DIAGNOSIS — I10 Essential (primary) hypertension: Secondary | ICD-10-CM | POA: Diagnosis not present

## 2019-02-18 DIAGNOSIS — K317 Polyp of stomach and duodenum: Secondary | ICD-10-CM | POA: Insufficient documentation

## 2019-02-18 DIAGNOSIS — E039 Hypothyroidism, unspecified: Secondary | ICD-10-CM | POA: Insufficient documentation

## 2019-02-18 DIAGNOSIS — R5382 Chronic fatigue, unspecified: Secondary | ICD-10-CM | POA: Diagnosis not present

## 2019-02-18 DIAGNOSIS — Z79899 Other long term (current) drug therapy: Secondary | ICD-10-CM | POA: Insufficient documentation

## 2019-02-18 DIAGNOSIS — F419 Anxiety disorder, unspecified: Secondary | ICD-10-CM | POA: Diagnosis not present

## 2019-02-18 DIAGNOSIS — R1310 Dysphagia, unspecified: Secondary | ICD-10-CM | POA: Insufficient documentation

## 2019-02-18 DIAGNOSIS — D123 Benign neoplasm of transverse colon: Secondary | ICD-10-CM | POA: Insufficient documentation

## 2019-02-18 DIAGNOSIS — Z7982 Long term (current) use of aspirin: Secondary | ICD-10-CM | POA: Insufficient documentation

## 2019-02-18 DIAGNOSIS — H8109 Meniere's disease, unspecified ear: Secondary | ICD-10-CM | POA: Insufficient documentation

## 2019-02-18 DIAGNOSIS — G894 Chronic pain syndrome: Secondary | ICD-10-CM | POA: Diagnosis not present

## 2019-02-18 DIAGNOSIS — K210 Gastro-esophageal reflux disease with esophagitis: Secondary | ICD-10-CM | POA: Insufficient documentation

## 2019-02-18 DIAGNOSIS — K648 Other hemorrhoids: Secondary | ICD-10-CM | POA: Insufficient documentation

## 2019-02-18 DIAGNOSIS — E0590 Thyrotoxicosis, unspecified without thyrotoxic crisis or storm: Secondary | ICD-10-CM | POA: Diagnosis not present

## 2019-02-18 DIAGNOSIS — F329 Major depressive disorder, single episode, unspecified: Secondary | ICD-10-CM | POA: Insufficient documentation

## 2019-02-18 DIAGNOSIS — Q438 Other specified congenital malformations of intestine: Secondary | ICD-10-CM | POA: Insufficient documentation

## 2019-02-18 DIAGNOSIS — K5090 Crohn's disease, unspecified, without complications: Secondary | ICD-10-CM | POA: Insufficient documentation

## 2019-02-18 DIAGNOSIS — Z1211 Encounter for screening for malignant neoplasm of colon: Secondary | ICD-10-CM | POA: Insufficient documentation

## 2019-02-18 HISTORY — DX: Cerebral infarction, unspecified: I63.9

## 2019-02-18 HISTORY — DX: Subacute and chronic vaginitis: N76.1

## 2019-02-18 HISTORY — PX: COLONOSCOPY WITH PROPOFOL: SHX5780

## 2019-02-18 HISTORY — DX: Allergy, unspecified, initial encounter: T78.40XA

## 2019-02-18 HISTORY — DX: Anxiety disorder, unspecified: F41.9

## 2019-02-18 HISTORY — DX: Personal history of other diseases of the female genital tract: Z87.42

## 2019-02-18 HISTORY — DX: Other specified diseases of pancreas: K86.89

## 2019-02-18 HISTORY — DX: Age-related osteoporosis without current pathological fracture: M81.0

## 2019-02-18 HISTORY — DX: Disorder of thyroid, unspecified: E07.9

## 2019-02-18 HISTORY — PX: ESOPHAGOGASTRODUODENOSCOPY (EGD) WITH PROPOFOL: SHX5813

## 2019-02-18 HISTORY — DX: Personal history of diseases of the skin and subcutaneous tissue: Z87.2

## 2019-02-18 SURGERY — ESOPHAGOGASTRODUODENOSCOPY (EGD) WITH PROPOFOL
Anesthesia: General

## 2019-02-18 MED ORDER — PROPOFOL 500 MG/50ML IV EMUL
INTRAVENOUS | Status: DC | PRN
  Administered 2019-02-18: 100 ug/kg/min via INTRAVENOUS

## 2019-02-18 MED ORDER — LIDOCAINE HCL (CARDIAC) PF 100 MG/5ML IV SOSY
PREFILLED_SYRINGE | INTRAVENOUS | Status: DC | PRN
  Administered 2019-02-18: 100 mg via INTRAVENOUS

## 2019-02-18 MED ORDER — PROPOFOL 10 MG/ML IV BOLUS
INTRAVENOUS | Status: DC | PRN
  Administered 2019-02-18 (×2): 30 mg via INTRAVENOUS
  Administered 2019-02-18: 50 mg via INTRAVENOUS

## 2019-02-18 MED ORDER — SODIUM CHLORIDE 0.9 % IV SOLN
INTRAVENOUS | Status: DC
  Administered 2019-02-18: 10:00:00 via INTRAVENOUS

## 2019-02-18 MED ORDER — GLYCOPYRROLATE 0.2 MG/ML IJ SOLN
INTRAMUSCULAR | Status: DC | PRN
  Administered 2019-02-18: 0.2 mg via INTRAVENOUS

## 2019-02-18 MED ORDER — EPHEDRINE SULFATE 50 MG/ML IJ SOLN
INTRAMUSCULAR | Status: DC | PRN
  Administered 2019-02-18 (×2): 7.5 mg via INTRAVENOUS

## 2019-02-18 MED ORDER — PHENYLEPHRINE HCL 10 MG/ML IJ SOLN
INTRAMUSCULAR | Status: DC | PRN
  Administered 2019-02-18 (×5): 100 ug via INTRAVENOUS

## 2019-02-18 NOTE — Op Note (Signed)
[facility] Gastroenterology Patient Name: Valerie Hall Procedure Date: 02/18/2019 9:08 AM MRN: [id number] Account #: [id number] Date of Birth: Dec 13, 1951 Admit Type: Outpatient Age: 68 Room: [facility] ENDO ROOM 3 Gender: Female Note Status: Finalized Procedure:            Upper GI endoscopy Indications:          Dysphagia Providers:            Manya Silvas, MD Medicines:            Propofol per Anesthesia Complications:        No immediate complications. Procedure:            Pre-Anesthesia Assessment:                       - After reviewing the risks and benefits, the patient                        was deemed in satisfactory condition to undergo the                        procedure.                       - After reviewing the risks and benefits, the patient                        was deemed in satisfactory condition to undergo the                        procedure.                       After obtaining informed consent, the endoscope was                        passed under direct vision. Throughout the procedure,                        the patient's blood pressure, pulse, and oxygen                        saturations were monitored continuously. The Endoscope                        was introduced through the mouth, and advanced to the                        second part of duodenum. The upper GI endoscopy was                        accomplished without difficulty. The patient tolerated                        the procedure well. Findings:      LA Grade A (one or more mucosal breaks less than 5 mm, not extending       between tops of 2 mucosal folds) esophagitis with no bleeding was found       38 cm from the incisors.      A single small sessile polyp with no bleeding and no stigmata of recent  bleeding was found in the cardia. Biopsies were taken with a cold       forceps for histology.      Patchy mildly erythematous mucosa without bleeding was found in  the       gastric antrum. Biopsies were taken with a cold forceps for histology.       Biopsies were taken with a cold forceps for Helicobacter pylori testing.      The examined duodenum was normal. Impression:           - LA Grade A reflux esophagitis. Rule out Barrett's                        esophagus.                       - A single gastric polyp. Biopsied.                       - Erythematous mucosa in the antrum. Biopsied.                       - Normal examined duodenum. Recommendation:       - Await pathology results. Manya Silvas, MD 02/18/2019 9:59:28 AM This report has been signed electronically. Number of Addenda: 0 Note Initiated On: 02/18/2019 9:08 AM      [facility]

## 2019-02-18 NOTE — Transfer of Care (Signed)
Immediate Anesthesia Transfer of Care Note  Patient: Valerie Hall  Procedure(s) Performed: ESOPHAGOGASTRODUODENOSCOPY (EGD) WITH PROPOFOL (N/A ) COLONOSCOPY WITH PROPOFOL (N/A )  Patient Location: PACU and Endoscopy Unit  Anesthesia Type:General  Level of Consciousness: awake, oriented, drowsy and patient cooperative  Airway & Oxygen Therapy: Patient Spontanous Breathing  Post-op Assessment: Report given to RN, Post -op Vital signs reviewed and stable and Patient moving all extremities  Post vital signs: Reviewed and stable  Last Vitals:  Vitals Value Taken Time  BP 116/55 02/18/2019 10:40 AM  Temp 37 C 02/18/2019 10:40 AM  Pulse 65 02/18/2019 10:40 AM  Resp 21 02/18/2019 10:40 AM  SpO2 99 % 02/18/2019 10:40 AM    Last Pain:  Vitals:   02/18/19 1040  TempSrc: Tympanic  PainSc:          Complications: No apparent anesthesia complications

## 2019-02-18 NOTE — Anesthesia Post-op Follow-up Note (Signed)
Anesthesia QCDR form completed.        
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
## 2019-02-18 NOTE — Op Note (Signed)
[facility] Gastroenterology Patient Name: Valerie Hall Procedure Date: 02/18/2019 9:08 AM MRN: [id number] Account #: [id number] Date of Birth: 12/04/1951 Admit Type: Outpatient Age: 68 Room: [facility] ENDO ROOM 3 Gender: Female Note Status: Finalized Procedure:            Colonoscopy Indications:          High risk colon cancer surveillance: Personal history                        of colonic polyps Providers:            Manya Silvas, MD Medicines:            Propofol per Anesthesia Complications:        No immediate complications. Procedure:            Pre-Anesthesia Assessment:                       - After reviewing the risks and benefits, the patient                        was deemed in satisfactory condition to undergo the                        procedure.                       After obtaining informed consent, the colonoscope was                        passed under direct vision. Throughout the procedure,                        the patient's blood pressure, pulse, and oxygen                        saturations were monitored continuously. The                        Colonoscope was introduced through the anus and                        advanced to the the cecum, identified by appendiceal                        orifice and ileocecal valve. The colonoscopy was                        somewhat difficult due to a redundant colon. Successful                        completion of the procedure was aided by applying                        abdominal pressure. The patient tolerated the procedure                        well. The quality of the bowel preparation was good. Findings:      Two sessile polyps were found in the transverse colon. The polyps were       small to medium in size. To prevent bleeding  after the polypectomy, one       hemostatic clip was successfully placed. There was no bleeding during,       or at the end, of the procedure.      A small polyp was  found in the transverse colon. The polyp was sessile.       The polyp was removed with a hot snare. Resection and retrieval were       complete. A cold snare was also used to remove tissue on this spot.      Internal hemorrhoids were found during endoscopy. The hemorrhoids were       small and Grade I (internal hemorrhoids that do not prolapse). Impression:           - Two medium polyps in the transverse colon. Clip was                        placed.                       - One small polyp in the transverse colon, removed with                        a hot snare. Resected and retrieved.                       - Internal hemorrhoids. Recommendation:       - Await pathology results. Manya Silvas, MD 02/18/2019 10:39:01 AM This report has been signed electronically. Number of Addenda: 0 Note Initiated On: 02/18/2019 9:08 AM Scope Withdrawal Time: 0 hours 16 minutes 32 seconds  Total Procedure Duration: 0 hours 30 minutes 44 seconds       [facility]
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
## 2019-02-19 ENCOUNTER — Encounter: Payer: Self-pay | Admitting: Unknown Physician Specialty

## 2019-02-20 LAB — SURGICAL PATHOLOGY

## 2019-02-21 NOTE — Anesthesia Postprocedure Evaluation (Signed)
Anesthesia Post Note  Patient: Valerie Hall  Procedure(s) Performed: ESOPHAGOGASTRODUODENOSCOPY (EGD) WITH PROPOFOL (N/A ) COLONOSCOPY WITH PROPOFOL (N/A )  Patient location during evaluation: PACU Anesthesia Type: General Level of consciousness: awake and alert Pain management: pain level controlled Vital Signs Assessment: post-procedure vital signs reviewed and stable Respiratory status: spontaneous breathing, nonlabored ventilation, respiratory function stable and patient connected to nasal cannula oxygen Cardiovascular status: blood pressure returned to baseline and stable Postop Assessment: no apparent nausea or vomiting Anesthetic complications: no     Last Vitals:  Vitals:   02/18/19 1050 02/18/19 1100  BP: 111/64 129/63  Pulse: 68 67  Resp: 15 19  Temp:    SpO2: 100% 100%    Last Pain:  Vitals:   02/19/19 0748  TempSrc:   PainSc: 0-No pain                 Molli Barrows

## 2019-08-31 ENCOUNTER — Other Ambulatory Visit: Payer: Self-pay

## 2019-08-31 ENCOUNTER — Emergency Department
Admission: EM | Admit: 2019-08-31 | Discharge: 2019-08-31 | Disposition: A | Discharge status: Home / Self Care | Payer: Medicare HMO | Attending: Emergency Medicine | Admitting: Emergency Medicine

## 2019-08-31 ENCOUNTER — Encounter: Payer: Self-pay | Admitting: Emergency Medicine

## 2019-08-31 DIAGNOSIS — E039 Hypothyroidism, unspecified: Secondary | ICD-10-CM | POA: Diagnosis not present

## 2019-08-31 DIAGNOSIS — R251 Tremor, unspecified: Secondary | ICD-10-CM | POA: Diagnosis not present

## 2019-08-31 DIAGNOSIS — R197 Diarrhea, unspecified: Secondary | ICD-10-CM | POA: Diagnosis present

## 2019-08-31 DIAGNOSIS — I10 Essential (primary) hypertension: Secondary | ICD-10-CM | POA: Insufficient documentation

## 2019-08-31 HISTORY — DX: Unspecified dementia, unspecified severity, without behavioral disturbance, psychotic disturbance, mood disturbance, and anxiety: F03.90

## 2019-08-31 LAB — CBC
HCT: 39.9 % (ref 36.0–46.0)
Hemoglobin: 12.7 g/dL (ref 12.0–15.0)
MCH: 30.8 pg (ref 26.0–34.0)
MCHC: 31.8 g/dL (ref 30.0–36.0)
MCV: 96.6 fL (ref 80.0–100.0)
Platelets: 167 10*3/uL (ref 150–400)
RBC: 4.13 MIL/uL (ref 3.87–5.11)
RDW: 13.6 % (ref 11.5–15.5)
WBC: 9.3 10*3/uL (ref 4.0–10.5)
nRBC: 0 % (ref 0.0–0.2)

## 2019-08-31 LAB — COMPREHENSIVE METABOLIC PANEL
ALT: 15 U/L (ref 0–44)
AST: 30 U/L (ref 15–41)
Albumin: 4.1 g/dL (ref 3.5–5.0)
Alkaline Phosphatase: 80 U/L (ref 38–126)
Anion gap: 13 (ref 5–15)
BUN: 7 mg/dL — ABNORMAL LOW (ref 8–23)
CO2: 22 mmol/L (ref 22–32)
Calcium: 8.1 mg/dL — ABNORMAL LOW (ref 8.9–10.3)
Chloride: 102 mmol/L (ref 98–111)
Creatinine, Ser: 1.12 mg/dL — ABNORMAL HIGH (ref 0.44–1.00)
GFR calc Af Amer: 58 mL/min — ABNORMAL LOW (ref >60)
GFR calc non Af Amer: 50 mL/min — ABNORMAL LOW (ref >60)
Glucose, Bld: 94 mg/dL (ref 70–99)
Potassium: 3.4 mmol/L — ABNORMAL LOW (ref 3.5–5.1)
Sodium: 137 mmol/L (ref 135–145)
Total Bilirubin: 1.1 mg/dL (ref 0.3–1.2)
Total Protein: 6.6 g/dL (ref 6.5–8.1)

## 2019-08-31 LAB — LIPASE, BLOOD: Lipase: 16 U/L (ref 11–51)

## 2019-08-31 NOTE — ED Triage Notes (Addendum)
Pt reports since august has been having 10+ episodes of diarrhea per day.  imodium has not been helping.  Pt reports even at night stool coming out of her.  C/o foul vaginal odor from stool seeping into vagina.  Did try diflucan and flagyl from dr sparks recently via phone call.  No vomiting. No fever. Has not eaten over past week.  Cannot drink or stool comes out per pt.  Has stopped PO because unbearable lower abdominal cramping/pain.  Uncontrollable tremors in triage that are new as of yesterday.  VS WNL but does take metoprolol.  Has had stool tested by her doctor but has been a while per pt.

## 2019-08-31 NOTE — ED Notes (Signed)
First nurse note: here with chronic diarrhea and dehydration, son with pt due to mild dementia.
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
## 2019-09-07 ENCOUNTER — Encounter: Payer: Self-pay | Admitting: Emergency Medicine

## 2019-09-07 ENCOUNTER — Other Ambulatory Visit: Payer: Self-pay

## 2019-09-07 ENCOUNTER — Emergency Department: Payer: Medicare HMO

## 2019-09-07 ENCOUNTER — Emergency Department
Admission: EM | Admit: 2019-09-07 | Discharge: 2019-09-07 | Disposition: A | Discharge status: Home / Self Care | Payer: Medicare HMO | Attending: Emergency Medicine | Admitting: Emergency Medicine

## 2019-09-07 DIAGNOSIS — R799 Abnormal finding of blood chemistry, unspecified: Secondary | ICD-10-CM | POA: Diagnosis present

## 2019-09-07 DIAGNOSIS — K529 Noninfective gastroenteritis and colitis, unspecified: Secondary | ICD-10-CM | POA: Diagnosis not present

## 2019-09-07 DIAGNOSIS — N179 Acute kidney failure, unspecified: Secondary | ICD-10-CM | POA: Insufficient documentation

## 2019-09-07 DIAGNOSIS — Z79899 Other long term (current) drug therapy: Secondary | ICD-10-CM | POA: Insufficient documentation

## 2019-09-07 DIAGNOSIS — E039 Hypothyroidism, unspecified: Secondary | ICD-10-CM | POA: Diagnosis not present

## 2019-09-07 DIAGNOSIS — Z8673 Personal history of transient ischemic attack (TIA), and cerebral infarction without residual deficits: Secondary | ICD-10-CM | POA: Insufficient documentation

## 2019-09-07 DIAGNOSIS — Z7982 Long term (current) use of aspirin: Secondary | ICD-10-CM | POA: Diagnosis not present

## 2019-09-07 LAB — URINALYSIS, COMPLETE (UACMP) WITH MICROSCOPIC
Bacteria, UA: NONE SEEN
Bilirubin Urine: NEGATIVE
Glucose, UA: NEGATIVE mg/dL
Hgb urine dipstick: NEGATIVE
Ketones, ur: 20 mg/dL — AB
Nitrite: NEGATIVE
Protein, ur: NEGATIVE mg/dL
Specific Gravity, Urine: 1.006 (ref 1.005–1.030)
pH: 6 (ref 5.0–8.0)

## 2019-09-07 LAB — CBC WITH DIFFERENTIAL/PLATELET
Abs Immature Granulocytes: 0.01 10*3/uL (ref 0.00–0.07)
Basophils Absolute: 0 10*3/uL (ref 0.0–0.1)
Basophils Relative: 1 %
Eosinophils Absolute: 0.1 10*3/uL (ref 0.0–0.5)
Eosinophils Relative: 2 %
HCT: 40 % (ref 36.0–46.0)
Hemoglobin: 12.5 g/dL (ref 12.0–15.0)
Immature Granulocytes: 0 %
Lymphocytes Relative: 30 %
Lymphs Abs: 1.5 10*3/uL (ref 0.7–4.0)
MCH: 30.3 pg (ref 26.0–34.0)
MCHC: 31.3 g/dL (ref 30.0–36.0)
MCV: 96.9 fL (ref 80.0–100.0)
Monocytes Absolute: 0.4 10*3/uL (ref 0.1–1.0)
Monocytes Relative: 8 %
Neutro Abs: 2.9 10*3/uL (ref 1.7–7.7)
Neutrophils Relative %: 59 %
Platelets: 185 10*3/uL (ref 150–400)
RBC: 4.13 MIL/uL (ref 3.87–5.11)
RDW: 14.1 % (ref 11.5–15.5)
WBC: 5 10*3/uL (ref 4.0–10.5)
nRBC: 0 % (ref 0.0–0.2)

## 2019-09-07 LAB — COMPREHENSIVE METABOLIC PANEL
ALT: 17 U/L (ref 0–44)
AST: 33 U/L (ref 15–41)
Albumin: 4 g/dL (ref 3.5–5.0)
Alkaline Phosphatase: 61 U/L (ref 38–126)
Anion gap: 15 (ref 5–15)
BUN: 17 mg/dL (ref 8–23)
CO2: 22 mmol/L (ref 22–32)
Calcium: 8.6 mg/dL — ABNORMAL LOW (ref 8.9–10.3)
Chloride: 96 mmol/L — ABNORMAL LOW (ref 98–111)
Creatinine, Ser: 1.93 mg/dL — ABNORMAL HIGH (ref 0.44–1.00)
GFR calc Af Amer: 30 mL/min — ABNORMAL LOW (ref >60)
GFR calc non Af Amer: 26 mL/min — ABNORMAL LOW (ref >60)
Glucose, Bld: 104 mg/dL — ABNORMAL HIGH (ref 70–99)
Potassium: 3.9 mmol/L (ref 3.5–5.1)
Sodium: 133 mmol/L — ABNORMAL LOW (ref 135–145)
Total Bilirubin: 1.1 mg/dL (ref 0.3–1.2)
Total Protein: 6.7 g/dL (ref 6.5–8.1)

## 2019-09-07 LAB — LIPASE, BLOOD: Lipase: 21 U/L (ref 11–51)

## 2019-09-07 MED ORDER — LACTATED RINGERS IV BOLUS
1000.0000 mL | Freq: Once | INTRAVENOUS | Status: AC
  Administered 2019-09-07: 1000 mL via INTRAVENOUS

## 2019-09-07 NOTE — ED Notes (Signed)
Pt reports she was sent to er for eval of kidney function.  Pt saw her doctor today.  Hx dementia, chronic diarrhea.  No v/d for past 2 days. Pt has intermittent abd pain.   Pt alert   Speech clear.   Pt in hallway bed.  md at bedside   Family at bedside.

## 2019-09-07 NOTE — ED Notes (Signed)
Report off to daniel rn

## 2019-09-07 NOTE — ED Triage Notes (Signed)
Went to doctor for FU and kidney function elevated compared to normal. Creatinine 1.8 at doctor office today per care everywhere.  Has chronic diarrhea.  No new sx.  VSS. Pt alert and oriented.
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
## 2019-09-09 ENCOUNTER — Other Ambulatory Visit
Admission: RE | Admit: 2019-09-09 | Discharge: 2019-09-09 | Disposition: A | Discharge status: Home / Self Care | Payer: Medicare HMO | Source: Ambulatory Visit | Attending: Nurse Practitioner | Admitting: Nurse Practitioner

## 2019-09-09 DIAGNOSIS — R197 Diarrhea, unspecified: Secondary | ICD-10-CM | POA: Diagnosis present

## 2019-09-09 LAB — C DIFFICILE QUICK SCREEN W PCR REFLEX
C Diff antigen: NEGATIVE
C Diff interpretation: NOT DETECTED
C Diff toxin: NEGATIVE

## 2019-09-14 LAB — GI PATHOGEN PANEL BY PCR, STOOL

## 2019-09-16 LAB — PANCREATIC ELASTASE, FECAL: Pancreatic Elastase-1, Stool: 406 ug Elast./g (ref >200)

## 2019-11-15 ENCOUNTER — Inpatient Hospital Stay: Payer: Medicare HMO

## 2019-11-15 ENCOUNTER — Emergency Department: Payer: Medicare HMO

## 2019-11-15 ENCOUNTER — Inpatient Hospital Stay
Admission: EM | Admit: 2019-11-15 | Discharge: 2019-11-25 | DRG: 070 | Disposition: A | Discharge status: Skilled Nursing Facility | Payer: Medicare HMO | Attending: Internal Medicine | Admitting: Internal Medicine

## 2019-11-15 ENCOUNTER — Encounter: Payer: Self-pay | Admitting: Emergency Medicine

## 2019-11-15 DIAGNOSIS — I959 Hypotension, unspecified: Secondary | ICD-10-CM | POA: Diagnosis present

## 2019-11-15 DIAGNOSIS — Z8673 Personal history of transient ischemic attack (TIA), and cerebral infarction without residual deficits: Secondary | ICD-10-CM

## 2019-11-15 DIAGNOSIS — R630 Anorexia: Secondary | ICD-10-CM | POA: Diagnosis present

## 2019-11-15 DIAGNOSIS — R4781 Slurred speech: Secondary | ICD-10-CM | POA: Diagnosis not present

## 2019-11-15 DIAGNOSIS — G4700 Insomnia, unspecified: Secondary | ICD-10-CM | POA: Diagnosis present

## 2019-11-15 DIAGNOSIS — S82841A Displaced bimalleolar fracture of right lower leg, initial encounter for closed fracture: Secondary | ICD-10-CM | POA: Diagnosis present

## 2019-11-15 DIAGNOSIS — Z79899 Other long term (current) drug therapy: Secondary | ICD-10-CM

## 2019-11-15 DIAGNOSIS — I10 Essential (primary) hypertension: Secondary | ICD-10-CM | POA: Diagnosis not present

## 2019-11-15 DIAGNOSIS — J189 Pneumonia, unspecified organism: Secondary | ICD-10-CM | POA: Diagnosis present

## 2019-11-15 DIAGNOSIS — R197 Diarrhea, unspecified: Secondary | ICD-10-CM | POA: Diagnosis not present

## 2019-11-15 DIAGNOSIS — Z7982 Long term (current) use of aspirin: Secondary | ICD-10-CM

## 2019-11-15 DIAGNOSIS — R29810 Facial weakness: Secondary | ICD-10-CM | POA: Diagnosis present

## 2019-11-15 DIAGNOSIS — W1830XA Fall on same level, unspecified, initial encounter: Secondary | ICD-10-CM | POA: Diagnosis present

## 2019-11-15 DIAGNOSIS — I129 Hypertensive chronic kidney disease with stage 1 through stage 4 chronic kidney disease, or unspecified chronic kidney disease: Secondary | ICD-10-CM | POA: Diagnosis present

## 2019-11-15 DIAGNOSIS — S82891D Other fracture of right lower leg, subsequent encounter for closed fracture with routine healing: Secondary | ICD-10-CM

## 2019-11-15 DIAGNOSIS — Z888 Allergy status to other drugs, medicaments and biological substances status: Secondary | ICD-10-CM

## 2019-11-15 DIAGNOSIS — N189 Chronic kidney disease, unspecified: Secondary | ICD-10-CM

## 2019-11-15 DIAGNOSIS — F418 Other specified anxiety disorders: Secondary | ICD-10-CM | POA: Diagnosis present

## 2019-11-15 DIAGNOSIS — J181 Lobar pneumonia, unspecified organism: Secondary | ICD-10-CM | POA: Diagnosis not present

## 2019-11-15 DIAGNOSIS — R1030 Lower abdominal pain, unspecified: Secondary | ICD-10-CM

## 2019-11-15 DIAGNOSIS — M810 Age-related osteoporosis without current pathological fracture: Secondary | ICD-10-CM | POA: Diagnosis present

## 2019-11-15 DIAGNOSIS — Z86010 Personal history of colonic polyps: Secondary | ICD-10-CM

## 2019-11-15 DIAGNOSIS — F0280 Dementia in other diseases classified elsewhere without behavioral disturbance: Secondary | ICD-10-CM

## 2019-11-15 DIAGNOSIS — Z91018 Allergy to other foods: Secondary | ICD-10-CM

## 2019-11-15 DIAGNOSIS — K5090 Crohn's disease, unspecified, without complications: Secondary | ICD-10-CM | POA: Diagnosis present

## 2019-11-15 DIAGNOSIS — R5382 Chronic fatigue, unspecified: Secondary | ICD-10-CM | POA: Diagnosis present

## 2019-11-15 DIAGNOSIS — R110 Nausea: Secondary | ICD-10-CM | POA: Diagnosis not present

## 2019-11-15 DIAGNOSIS — Z885 Allergy status to narcotic agent status: Secondary | ICD-10-CM

## 2019-11-15 DIAGNOSIS — N1831 Chronic kidney disease, stage 3a: Secondary | ICD-10-CM | POA: Diagnosis present

## 2019-11-15 DIAGNOSIS — I639 Cerebral infarction, unspecified: Secondary | ICD-10-CM

## 2019-11-15 DIAGNOSIS — Z90710 Acquired absence of both cervix and uterus: Secondary | ICD-10-CM

## 2019-11-15 DIAGNOSIS — B957 Other staphylococcus as the cause of diseases classified elsewhere: Secondary | ICD-10-CM | POA: Diagnosis present

## 2019-11-15 DIAGNOSIS — N179 Acute kidney failure, unspecified: Secondary | ICD-10-CM

## 2019-11-15 DIAGNOSIS — Z79890 Hormone replacement therapy (postmenopausal): Secondary | ICD-10-CM

## 2019-11-15 DIAGNOSIS — R296 Repeated falls: Secondary | ICD-10-CM | POA: Diagnosis present

## 2019-11-15 DIAGNOSIS — E039 Hypothyroidism, unspecified: Secondary | ICD-10-CM | POA: Diagnosis present

## 2019-11-15 DIAGNOSIS — Z20828 Contact with and (suspected) exposure to other viral communicable diseases: Secondary | ICD-10-CM | POA: Diagnosis present

## 2019-11-15 DIAGNOSIS — G894 Chronic pain syndrome: Secondary | ICD-10-CM | POA: Diagnosis present

## 2019-11-15 DIAGNOSIS — G9341 Metabolic encephalopathy: Principal | ICD-10-CM | POA: Diagnosis present

## 2019-11-15 DIAGNOSIS — G309 Alzheimer's disease, unspecified: Secondary | ICD-10-CM | POA: Diagnosis present

## 2019-11-15 DIAGNOSIS — K219 Gastro-esophageal reflux disease without esophagitis: Secondary | ICD-10-CM | POA: Diagnosis present

## 2019-11-15 DIAGNOSIS — N3000 Acute cystitis without hematuria: Secondary | ICD-10-CM | POA: Diagnosis present

## 2019-11-15 DIAGNOSIS — Z23 Encounter for immunization: Secondary | ICD-10-CM | POA: Diagnosis not present

## 2019-11-15 DIAGNOSIS — R338 Other retention of urine: Secondary | ICD-10-CM

## 2019-11-15 DIAGNOSIS — R339 Retention of urine, unspecified: Secondary | ICD-10-CM | POA: Diagnosis not present

## 2019-11-15 DIAGNOSIS — R4701 Aphasia: Secondary | ICD-10-CM | POA: Diagnosis present

## 2019-11-15 DIAGNOSIS — S82831A Other fracture of upper and lower end of right fibula, initial encounter for closed fracture: Secondary | ICD-10-CM | POA: Diagnosis not present

## 2019-11-15 DIAGNOSIS — Z8249 Family history of ischemic heart disease and other diseases of the circulatory system: Secondary | ICD-10-CM

## 2019-11-15 DIAGNOSIS — M25571 Pain in right ankle and joints of right foot: Secondary | ICD-10-CM

## 2019-11-15 DIAGNOSIS — D72829 Elevated white blood cell count, unspecified: Secondary | ICD-10-CM

## 2019-11-15 DIAGNOSIS — Z79891 Long term (current) use of opiate analgesic: Secondary | ICD-10-CM

## 2019-11-15 LAB — URINALYSIS, ROUTINE W REFLEX MICROSCOPIC
Bacteria, UA: NONE SEEN
Bilirubin Urine: NEGATIVE
Glucose, UA: NEGATIVE mg/dL
Hgb urine dipstick: NEGATIVE
Ketones, ur: NEGATIVE mg/dL
Nitrite: POSITIVE — AB
Protein, ur: NEGATIVE mg/dL
Specific Gravity, Urine: 1.023 (ref 1.005–1.030)
Squamous Epithelial / LPF: NONE SEEN (ref 0–5)
WBC, UA: >50 WBC/hpf — ABNORMAL HIGH (ref 0–5)
pH: 6 (ref 5.0–8.0)

## 2019-11-15 LAB — CBC WITH DIFFERENTIAL/PLATELET
Abs Immature Granulocytes: 0.06 10*3/uL (ref 0.00–0.07)
Basophils Absolute: 0 10*3/uL (ref 0.0–0.1)
Basophils Relative: 0 %
Eosinophils Absolute: 0.1 10*3/uL (ref 0.0–0.5)
Eosinophils Relative: 0 %
HCT: 40.7 % (ref 36.0–46.0)
Hemoglobin: 13.5 g/dL (ref 12.0–15.0)
Immature Granulocytes: 0 %
Lymphocytes Relative: 9 %
Lymphs Abs: 1.4 10*3/uL (ref 0.7–4.0)
MCH: 32.1 pg (ref 26.0–34.0)
MCHC: 33.2 g/dL (ref 30.0–36.0)
MCV: 96.7 fL (ref 80.0–100.0)
Monocytes Absolute: 0.8 10*3/uL (ref 0.1–1.0)
Monocytes Relative: 5 %
Neutro Abs: 13.7 10*3/uL — ABNORMAL HIGH (ref 1.7–7.7)
Neutrophils Relative %: 86 %
Platelets: 244 10*3/uL (ref 150–400)
RBC: 4.21 MIL/uL (ref 3.87–5.11)
RDW: 14.2 % (ref 11.5–15.5)
WBC: 16 10*3/uL — ABNORMAL HIGH (ref 4.0–10.5)
nRBC: 0 % (ref 0.0–0.2)

## 2019-11-15 LAB — COMPREHENSIVE METABOLIC PANEL
ALT: 23 U/L (ref 0–44)
AST: 30 U/L (ref 15–41)
Albumin: 3.5 g/dL (ref 3.5–5.0)
Alkaline Phosphatase: 113 U/L (ref 38–126)
Anion gap: 10 (ref 5–15)
BUN: 20 mg/dL (ref 8–23)
CO2: 23 mmol/L (ref 22–32)
Calcium: 8.5 mg/dL — ABNORMAL LOW (ref 8.9–10.3)
Chloride: 107 mmol/L (ref 98–111)
Creatinine, Ser: 1.43 mg/dL — ABNORMAL HIGH (ref 0.44–1.00)
GFR calc Af Amer: 43 mL/min — ABNORMAL LOW (ref >60)
GFR calc non Af Amer: 37 mL/min — ABNORMAL LOW (ref >60)
Glucose, Bld: 111 mg/dL — ABNORMAL HIGH (ref 70–99)
Potassium: 4.1 mmol/L (ref 3.5–5.1)
Sodium: 140 mmol/L (ref 135–145)
Total Bilirubin: 0.9 mg/dL (ref 0.3–1.2)
Total Protein: 7.2 g/dL (ref 6.5–8.1)

## 2019-11-15 LAB — HEMOGLOBIN A1C
Hgb A1c MFr Bld: 6.1 % — ABNORMAL HIGH (ref 4.8–5.6)
Mean Plasma Glucose: 128.37 mg/dL

## 2019-11-15 LAB — URINE DRUG SCREEN, QUALITATIVE (ARMC ONLY)
Amphetamines, Ur Screen: NOT DETECTED
Barbiturates, Ur Screen: NOT DETECTED
Benzodiazepine, Ur Scrn: NOT DETECTED
Cannabinoid 50 Ng, Ur ~~LOC~~: NOT DETECTED
Cocaine Metabolite,Ur ~~LOC~~: NOT DETECTED
MDMA (Ecstasy)Ur Screen: NOT DETECTED
Methadone Scn, Ur: NOT DETECTED
Opiate, Ur Screen: NOT DETECTED
Phencyclidine (PCP) Ur S: NOT DETECTED
Tricyclic, Ur Screen: POSITIVE — AB

## 2019-11-15 LAB — ETHANOL: Alcohol, Ethyl (B): <10 mg/dL (ref <10)

## 2019-11-15 LAB — TROPONIN I (HIGH SENSITIVITY)
Troponin I (High Sensitivity): 5 ng/L (ref <18)
Troponin I (High Sensitivity): 4 ng/L (ref <18)

## 2019-11-15 LAB — SARS CORONAVIRUS 2 (TAT 6-24 HRS): SARS Coronavirus 2: NEGATIVE

## 2019-11-15 LAB — PROCALCITONIN: Procalcitonin: <0.1 ng/mL

## 2019-11-15 LAB — VALPROIC ACID LEVEL: Valproic Acid Lvl: <10 ug/mL — ABNORMAL LOW (ref 50.0–100.0)

## 2019-11-15 LAB — LIPASE, BLOOD: Lipase: 16 U/L (ref 11–51)

## 2019-11-15 MED ORDER — ASPIRIN EC 81 MG PO TBEC
81.0000 mg | DELAYED_RELEASE_TABLET | Freq: Every day | ORAL | Status: DC
  Administered 2019-11-15 – 2019-11-24 (×10): 81 mg via ORAL
  Filled 2019-11-15 (×12): qty 1

## 2019-11-15 MED ORDER — LEVOTHYROXINE SODIUM 50 MCG PO TABS
50.0000 ug | ORAL_TABLET | Freq: Every day | ORAL | Status: DC
  Administered 2019-11-16 – 2019-11-25 (×9): 50 ug via ORAL
  Filled 2019-11-15 (×9): qty 1

## 2019-11-15 MED ORDER — MELATONIN 5 MG PO TABS
10.0000 mg | ORAL_TABLET | Freq: Every day | ORAL | Status: DC
  Filled 2019-11-15 (×2): qty 2

## 2019-11-15 MED ORDER — AMITRIPTYLINE HCL 50 MG PO TABS
25.0000 mg | ORAL_TABLET | Freq: Every day | ORAL | Status: DC
  Filled 2019-11-15: qty 1

## 2019-11-15 MED ORDER — ENOXAPARIN SODIUM 40 MG/0.4ML ~~LOC~~ SOLN
40.0000 mg | SUBCUTANEOUS | Status: DC
  Administered 2019-11-15 – 2019-11-24 (×10): 40 mg via SUBCUTANEOUS
  Filled 2019-11-15 (×11): qty 0.4

## 2019-11-15 MED ORDER — AZITHROMYCIN 500 MG PO TABS
500.0000 mg | ORAL_TABLET | Freq: Once | ORAL | Status: AC
  Administered 2019-11-15: 21:00:00 500 mg via ORAL
  Filled 2019-11-15: qty 1

## 2019-11-15 MED ORDER — SODIUM CHLORIDE 0.9 % IV SOLN
1.0000 g | Freq: Once | INTRAVENOUS | Status: AC
  Administered 2019-11-15: 17:00:00 1 g via INTRAVENOUS
  Filled 2019-11-15: qty 10

## 2019-11-15 MED ORDER — ACETAMINOPHEN 325 MG PO TABS
650.0000 mg | ORAL_TABLET | ORAL | Status: DC | PRN
  Administered 2019-11-15 – 2019-11-24 (×11): 650 mg via ORAL
  Filled 2019-11-15 (×11): qty 2

## 2019-11-15 MED ORDER — PANTOPRAZOLE SODIUM 40 MG PO TBEC
40.0000 mg | DELAYED_RELEASE_TABLET | Freq: Every day | ORAL | Status: DC
  Administered 2019-11-16 – 2019-11-25 (×10): 40 mg via ORAL
  Filled 2019-11-15 (×11): qty 1

## 2019-11-15 MED ORDER — SODIUM CHLORIDE 0.9 % IV SOLN
INTRAVENOUS | Status: DC
  Administered 2019-11-15 – 2019-11-20 (×4): via INTRAVENOUS

## 2019-11-15 MED ORDER — ACETAMINOPHEN 650 MG RE SUPP: 650.0000 mg | RECTAL | Status: DC | PRN

## 2019-11-15 MED ORDER — STROKE: EARLY STAGES OF RECOVERY BOOK
Freq: Once | Status: AC
  Administered 2019-11-16: 19:00:00

## 2019-11-15 MED ORDER — METOPROLOL SUCCINATE ER 50 MG PO TB24: 100.0000 mg | ORAL_TABLET | Freq: Every day | ORAL | Status: DC

## 2019-11-15 MED ORDER — DONEPEZIL HCL 5 MG PO TABS
10.0000 mg | ORAL_TABLET | Freq: Every day | ORAL | Status: DC
  Administered 2019-11-16 – 2019-11-24 (×9): 10 mg via ORAL
  Filled 2019-11-15 (×10): qty 2

## 2019-11-15 MED ORDER — CLONAZEPAM 0.5 MG PO TABS
1.0000 mg | ORAL_TABLET | Freq: Every day | ORAL | Status: DC
  Administered 2019-11-15: 1 mg via ORAL
  Filled 2019-11-15: qty 2

## 2019-11-15 MED ORDER — ACETAMINOPHEN 160 MG/5ML PO SOLN
650.0000 mg | ORAL | Status: DC | PRN
  Filled 2019-11-15: qty 20.3

## 2019-11-15 MED ORDER — DIVALPROEX SODIUM 125 MG PO DR TAB
125.0000 mg | DELAYED_RELEASE_TABLET | Freq: Two times a day (BID) | ORAL | Status: DC
  Administered 2019-11-15 – 2019-11-25 (×20): 125 mg via ORAL
  Filled 2019-11-15 (×22): qty 1

## 2019-11-15 MED ORDER — IOHEXOL 300 MG/ML  SOLN
75.0000 mL | Freq: Once | INTRAMUSCULAR | Status: AC | PRN
  Administered 2019-11-15: 13:00:00 75 mL via INTRAVENOUS

## 2019-11-15 MED ORDER — MIRTAZAPINE 15 MG PO TABS
30.0000 mg | ORAL_TABLET | Freq: Every day | ORAL | Status: DC
  Filled 2019-11-15: qty 2

## 2019-11-15 NOTE — ED Notes (Signed)
Admitting provider at bedside during med pass. Multiple medications discontinued/held per provider. See MAR.

## 2019-11-15 NOTE — ED Notes (Signed)
Red tube sent to lab

## 2019-11-15 NOTE — ED Notes (Signed)
Zach and Caitlyn EDTs at bedside for splint

## 2019-11-15 NOTE — ED Notes (Addendum)
X-ray at bedside

## 2019-11-15 NOTE — ED Triage Notes (Signed)
Pt to ED by EMS from home where family states pt has had increased weakness for approx 2 weeks and has become more lethargic. Pt has hx of similar as well as dementia. Pt has c/o of right knee pain after falling yesterday. Per family pt has been falling frequently.

## 2019-11-15 NOTE — ED Notes (Signed)
Patient transported to MRI 

## 2019-11-15 NOTE — ED Notes (Signed)
X-ray at bedside

## 2019-11-15 NOTE — ED Notes (Signed)
Phone given to son for MRI screening

## 2019-11-15 NOTE — ED Notes (Signed)
Called pharmacy to request lovenox and depakote.

## 2019-11-15 NOTE — ED Notes (Signed)
Lab at bedside
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
## 2019-11-15 NOTE — ED Notes (Signed)
Pt states she needs to use the bathroom- pt brief soiled with fecal matter- pt cleaned and brief changed- pt placed on purewick

## 2019-11-15 NOTE — ED Notes (Signed)
Spoke with admitting MD about pain medication
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
## 2019-11-15 NOTE — ED Notes (Addendum)
Called lab and asked them to redraw red and to draw troponin

## 2019-11-15 NOTE — ED Notes (Signed)
CT at bedside

## 2019-11-15 NOTE — Consult Note (Addendum)
Asked by Dr. Jari Pigg to review patient's xray.  Patient has a non-displaced fracture of the proximal right fibula.   She is reported to have a history of multiple recent falls. The fibula fracture is completely non-displaced and does not need to be splinted.   There is no widening of the right ankle on her ankle films.   I feel the fibular fracture is at low risk for displacement and the patient may WBAT on it.  I would recommend a PT consult for safety evaluation and to determine if the patient requires an assist device for balance while ambulating.  Addendum at 4:05 PM  11/15/19:  After reviewing ankle xrays, there was no mortise view taken.  I ordered this view and have reviewed it.  The patient is not found to have non-displaced fractures of the right medial and lateral malleoli.   This fracture needs to be placed in an AO splint (posteior splint with a U component).  Patient is now non-weightbearing on the right lower extremity given her ankle fractures.   The fractures are still non-operative at this point given they are non-displaced.  A formal consult to follow once the patient is on the floor.  Dr. Jari Pigg in the ER is aware of the fracture and the plan for management.

## 2019-11-16 ENCOUNTER — Other Ambulatory Visit: Payer: Self-pay

## 2019-11-16 ENCOUNTER — Inpatient Hospital Stay
Admit: 2019-11-16 | Discharge: 2019-11-16 | Disposition: A | Discharge status: Home / Self Care | Payer: Medicare HMO | Attending: Internal Medicine | Admitting: Internal Medicine

## 2019-11-16 ENCOUNTER — Inpatient Hospital Stay: Payer: Medicare HMO

## 2019-11-16 DIAGNOSIS — S82891D Other fracture of right lower leg, subsequent encounter for closed fracture with routine healing: Secondary | ICD-10-CM

## 2019-11-16 DIAGNOSIS — J189 Pneumonia, unspecified organism: Secondary | ICD-10-CM

## 2019-11-16 DIAGNOSIS — N3000 Acute cystitis without hematuria: Secondary | ICD-10-CM

## 2019-11-16 DIAGNOSIS — I959 Hypotension, unspecified: Secondary | ICD-10-CM

## 2019-11-16 LAB — LIPID PANEL
Cholesterol: 114 mg/dL (ref 0–200)
HDL: 44 mg/dL (ref >40)
LDL Cholesterol: 53 mg/dL (ref 0–99)
Total CHOL/HDL Ratio: 2.6 RATIO
Triglycerides: 85 mg/dL (ref <150)
VLDL: 17 mg/dL (ref 0–40)

## 2019-11-16 LAB — BASIC METABOLIC PANEL
Anion gap: 11 (ref 5–15)
BUN: 21 mg/dL (ref 8–23)
CO2: 20 mmol/L — ABNORMAL LOW (ref 22–32)
Calcium: 7.5 mg/dL — ABNORMAL LOW (ref 8.9–10.3)
Chloride: 107 mmol/L (ref 98–111)
Creatinine, Ser: 1.41 mg/dL — ABNORMAL HIGH (ref 0.44–1.00)
GFR calc Af Amer: 44 mL/min — ABNORMAL LOW (ref >60)
GFR calc non Af Amer: 38 mL/min — ABNORMAL LOW (ref >60)
Glucose, Bld: 97 mg/dL (ref 70–99)
Potassium: 3.4 mmol/L — ABNORMAL LOW (ref 3.5–5.1)
Sodium: 138 mmol/L (ref 135–145)

## 2019-11-16 LAB — ECHOCARDIOGRAM COMPLETE
Height: 66 in
Weight: 2800 oz

## 2019-11-16 LAB — BLOOD GAS, ARTERIAL
Acid-base deficit: 3.1 mmol/L — ABNORMAL HIGH (ref 0.0–2.0)
Bicarbonate: 21.2 mmol/L (ref 20.0–28.0)
FIO2: 1
O2 Saturation: 99.9 %
Patient temperature: 37
pCO2 arterial: 35 mmHg (ref 32.0–48.0)
pH, Arterial: 7.39 (ref 7.350–7.450)
pO2, Arterial: 275 mmHg — ABNORMAL HIGH (ref 83.0–108.0)

## 2019-11-16 LAB — PROCALCITONIN: Procalcitonin: 0.1 ng/mL

## 2019-11-16 LAB — TSH: TSH: 4.793 u[IU]/mL — ABNORMAL HIGH (ref 0.350–4.500)

## 2019-11-16 LAB — GLUCOSE, CAPILLARY: Glucose-Capillary: 136 mg/dL — ABNORMAL HIGH (ref 70–99)

## 2019-11-16 LAB — HIV ANTIBODY (ROUTINE TESTING W REFLEX): HIV Screen 4th Generation wRfx: NONREACTIVE

## 2019-11-16 MED ORDER — CLONAZEPAM 0.5 MG PO TABS
0.5000 mg | ORAL_TABLET | Freq: Every day | ORAL | Status: DC
  Administered 2019-11-16 – 2019-11-24 (×9): 0.5 mg via ORAL
  Filled 2019-11-16 (×9): qty 1

## 2019-11-16 MED ORDER — SODIUM CHLORIDE 0.9 % IV SOLN
1.0000 g | INTRAVENOUS | Status: DC
  Administered 2019-11-16 – 2019-11-18 (×3): 1 g via INTRAVENOUS
  Filled 2019-11-16 (×3): qty 1

## 2019-11-16 MED ORDER — POTASSIUM CHLORIDE 20 MEQ PO PACK
40.0000 meq | PACK | Freq: Once | ORAL | Status: AC
  Administered 2019-11-16: 12:00:00 40 meq via ORAL
  Filled 2019-11-16 (×2): qty 2

## 2019-11-16 MED ORDER — AMITRIPTYLINE HCL 10 MG PO TABS
10.0000 mg | ORAL_TABLET | Freq: Every day | ORAL | Status: DC
  Administered 2019-11-16 – 2019-11-24 (×9): 10 mg via ORAL
  Filled 2019-11-16 (×11): qty 1

## 2019-11-16 MED ORDER — MIRTAZAPINE 15 MG PO TABS
7.5000 mg | ORAL_TABLET | Freq: Every day | ORAL | Status: DC
  Filled 2019-11-16: qty 1

## 2019-11-16 MED ORDER — METOPROLOL SUCCINATE ER 25 MG PO TB24
25.0000 mg | ORAL_TABLET | Freq: Every day | ORAL | Status: DC
  Filled 2019-11-16: qty 1

## 2019-11-16 MED ORDER — PNEUMOCOCCAL VAC POLYVALENT 25 MCG/0.5ML IJ INJ
0.5000 mL | INJECTION | INTRAMUSCULAR | Status: AC
  Administered 2019-11-17: 09:00:00 0.5 mL via INTRAMUSCULAR
  Filled 2019-11-16: qty 0.5

## 2019-11-16 MED ORDER — AZITHROMYCIN 250 MG PO TABS
250.0000 mg | ORAL_TABLET | Freq: Every day | ORAL | Status: DC
  Administered 2019-11-16 – 2019-11-18 (×3): 250 mg via ORAL
  Filled 2019-11-16 (×3): qty 1

## 2019-11-16 MED ORDER — KETOROLAC TROMETHAMINE 15 MG/ML IJ SOLN
15.0000 mg | Freq: Four times a day (QID) | INTRAMUSCULAR | Status: AC | PRN
  Administered 2019-11-16 – 2019-11-19 (×7): 15 mg via INTRAVENOUS
  Filled 2019-11-16 (×8): qty 1

## 2019-11-16 NOTE — Progress Notes (Signed)
SLP Cancellation Note  Patient Details Name: TOPEKA GIAMMONA MRN: [id number] DOB: 05/10/1951   Cancelled treatment:       Reason Eval/Treat Not Completed: (chart reviewed; consulted NSG then met w/ pt/Son in room). Pt is an 68 y.o. female with a history of Dementia who was admitted s/p falls and increased confusion. She has had a recent episode of unresponsiveness overnight but initial MRI and repeat Head CT are negative. Initial CXR clear at admit. Neurology and MD are considering reaction may be related to her cocktail of medications which MD has reduced.  ST services will hold on a Cognitive assessment at this time while pt's medical status, and medication status, are unstable which can further exacerbate Dementia presentation, confusion. Pt may be best served w/ formal Cognitive assessment at next setting when more stable and in routine/structure. Recommend full Supervision if not discharging to SNF for Rehab. NSG and Son/pt updated. ST services will f/u next 1-2 days w/ pt's status during admission. Recommend assistance at all meals; general aspiration precautions.     Orinda Kenner, [place], CCC-SLP Tabbetha Kutscher 11/16/2019, 1:03 PM

## 2019-11-16 NOTE — Progress Notes (Signed)
OT Cancellation Note  Patient Details Name: Valerie Hall MRN: [id number] DOB: [birth year]   Cancelled Treatment:    Reason Eval/Treat Not Completed: Other (comment) Order received and chart reviewed. Upon review of ED note: pt presented unresponsive, posturing and with fixed pupil overnight. However, RN reports doing much better this AM and okay's therapy. However, when OT presents for evaluation, pt politely declines stating she doesn't feel she can handle anything at the moment. Pt requests pain medication citing 8/10 pain in her back and bottom. This information is relayed to RN, Chrissy. Will f/u as able for OT evaluation.   Gerrianne Scale, [place], OTR/L ascom [phone] 11/16/19, 9:58 AM

## 2019-11-16 NOTE — ED Notes (Signed)
Patient responsive to verbal stimuli

## 2019-11-16 NOTE — Procedures (Signed)
ELECTROENCEPHALOGRAM REPORT   Patient: Valerie Hall       Room #: PN22Z EEG No. ID: [id number] Age: 68 y.o.        Sex: female Referring Physician: Leslye Peer Report Date:  11/16/2019        Interpreting Physician: Alexis Goodell  History: Valerie Hall is an 68 y.o. female with episode of unresponsiveness  Medications:  Elavil, Rocephin, Zithromax, Klonopin, Aricept, Synthroid, Remeron, Melatonin  Conditions of Recording:  This is a 21 channel routine scalp EEG performed with bipolar and monopolar montages arranged in accordance to the international 10/20 system of electrode placement. One channel was dedicated to EKG recording.  The patient is in the drowsy state.  Description:  The patient does not appear to  achieve full wakefulness throughout the recording.  The patient remains in drowse with the background being slow and irregular consisting of low voltage theta and beta activity.  This activity is continuous throughout the recording. Stage II sleep is not obtained. No epileptiform activity is noted.   Hyperventilation was not performed.  Intermittent photic stimulation was performed but failed to illicit any change in the tracing.   IMPRESSION: This is a normal drowsy electroencephalogram.  Wakefulness could not be evaluated.  There are no focal lateralizing or epileptiform features.   Alexis Goodell, MD Neurology [phone] 11/16/2019, 3:31 PM

## 2019-11-16 NOTE — ED Notes (Signed)
Patient responsive to painful stimuli only at this time.

## 2019-11-16 NOTE — Progress Notes (Signed)
*  PRELIMINARY RESULTS* Echocardiogram 2D Echocardiogram has been performed.  Valerie Hall 11/16/2019, 7:53 AM

## 2019-11-16 NOTE — ED Notes (Signed)
Pt transported to EEG

## 2019-11-16 NOTE — Progress Notes (Signed)
PT Cancellation Note  Patient Details Name: Valerie Hall MRN: [id number] DOB: 05-Apr-1951   Cancelled Treatment:    Reason Eval/Treat Not Completed: Other (comment).  PT consult received.  Chart reviewed.  Nursing note from 1330 (5 minutes ago) reports pt transported to EEG (pt currently not available for PT consult d/t this).  Per chart review, pt politely declined OT evaluation this morning.  Will re-attempt PT evaluation at a later date/time.  Leitha Bleak, PT 11/16/19, 1:37 PM

## 2019-11-16 NOTE — ED Notes (Addendum)
Admitting MD arrived at bedside. Patient now with pupillary and gag reflex. Patient still unresponsive to both painful and verbal stimuli.

## 2019-11-16 NOTE — Progress Notes (Signed)
Patient ID: Arbie Cookey, female   DOB: [birth year], 68 y.o.   MRN: [id number] [facility] PROGRESS NOTE  COURTNY BENNISON XHB:[id number] DOB: [birth year] DOA: 11/15/2019 PCP: Idelle Crouch, MD  HPI/Subjective: Patient had an episode of unresponsiveness overnight.  ER physician and nighttime coverage had to go evaluate her.  Stroke work-up including MRI of the brain and repeat CT scan of the head was negative.  Objective: Vitals:   11/16/19 0915 11/16/19 0955  BP: (!) 95/58 (!) 94/56  Pulse: 94 93  Resp: 19 19  Temp:    SpO2: 100% 100%    Filed Weights   11/15/19 1108  Weight: 79.4 kg    ROS: Review of Systems  Constitutional: Negative for chills and fever.  Eyes: Negative for blurred vision.  Respiratory: Negative for cough and shortness of breath.   Cardiovascular: Negative for chest pain.  Gastrointestinal: Negative for abdominal pain, constipation, diarrhea, nausea and vomiting.  Genitourinary: Negative for dysuria.  Musculoskeletal: Positive for joint pain.  Neurological: Negative for dizziness and headaches.   Exam: Physical Exam  HENT:  Nose: No mucosal edema.  Mouth/Throat: No oropharyngeal exudate or posterior oropharyngeal edema.  Eyes: Pupils are equal, round, and reactive to light. Conjunctivae, EOM and lids are normal.  Neck: No JVD present. Carotid bruit is not present. No edema present. No thyroid mass and no thyromegaly present.  Cardiovascular: S1 normal and S2 normal. Exam reveals no gallop.  No murmur heard. Pulses:      Dorsalis pedis pulses are 2+ on the right side and 2+ on the left side.  Respiratory: No respiratory distress. She has no wheezes. She has no rhonchi. She has no rales.  GI: Soft. Bowel sounds are normal. There is no abdominal tenderness.  Musculoskeletal:     Left ankle: She exhibits no swelling.     Comments: Right ankle and lower leg in splint.  Lymphadenopathy:    She has no cervical adenopathy.  Neurological: She  is alert. No cranial nerve deficit.  Skin: Skin is warm. No rash noted. Nails show no clubbing.  Psychiatric: She has a normal mood and affect.      Data Reviewed: Basic Metabolic Panel: Recent Labs  Lab 11/15/19 1112 11/16/19 0438  NA 140 138  K 4.1 3.4*  CL 107 107  CO2 23 20*  GLUCOSE 111* 97  BUN 20 21  CREATININE 1.43* 1.41*  CALCIUM 8.5* 7.5*   Liver Function Tests: Recent Labs  Lab 11/15/19 1112  AST 30  ALT 23  ALKPHOS 113  BILITOT 0.9  PROT 7.2  ALBUMIN 3.5   Recent Labs  Lab 11/15/19 1112  LIPASE 16   CBC: Recent Labs  Lab 11/15/19 1112  WBC 16.0*  NEUTROABS 13.7*  HGB 13.5  HCT 40.7  MCV 96.7  PLT 244    CBG: Recent Labs  Lab 11/16/19 0054  GLUCAP 136*    Recent Results (from the past 240 hour(s))  SARS CORONAVIRUS 2 (TAT 6-24 HRS) Nasopharyngeal Nasopharyngeal Swab     Status: None   Collection Time: 11/15/19  3:21 PM   Specimen: Nasopharyngeal Swab  Result Value Ref Range Status   SARS Coronavirus 2 NEGATIVE NEGATIVE Final    Comment: (NOTE) SARS-CoV-2 target nucleic acids are NOT DETECTED. The SARS-CoV-2 RNA is generally detectable in upper and lower respiratory specimens during the acute phase of infection. Negative results do not preclude SARS-CoV-2 infection, do not rule out co-infections with other pathogens, and should not be used  as the sole basis for treatment or other patient management decisions. Negative results must be combined with clinical observations, patient history, and epidemiological information. The expected result is Negative. Fact Sheet for Patients: SugarRoll.be Fact Sheet for Healthcare Providers: https://www.woods-mathews.com/ This test is not yet approved or cleared by the [place] FDA and  has been authorized for detection and/or diagnosis of SARS-CoV-2 by FDA under an Emergency Use Authorization (EUA). This EUA will remain  in effect (meaning this test  can be used) for the duration of the COVID-19 declaration under Section 56 4(b)(1) of the Act, 21 U.S.C. section 360bbb-3(b)(1), unless the authorization is terminated or revoked sooner. Performed at [facility], [place] [address] [place], [place] [address]      Studies: Dg Chest 1 View  Result Date: 11/16/2019 CLINICAL DATA:  Falls.  Possible pneumonia.  Possible CVA. EXAM: CHEST  1 VIEW COMPARISON:  11/15/2019. FINDINGS: Mediastinum hilar structures normal. Mild right perihilar atelectasis/infiltrate. Mild left base subsegmental atelectasis. Tiny left pleural effusion cannot be excluded. No pneumothorax. Heart size normal. No acute bony abnormality identified. IMPRESSION: Mild right perihilar atelectasis/infiltrate. Mild left base subsegmental atelectasis. Tiny left pleural effusion cannot be excluded. Electronically Signed   By: Marcello Moores  Register   On: 11/16/2019 07:01   Dg Ankle 2 Views Right  Result Date: 11/15/2019 CLINICAL DATA:  Pain following fall EXAM: RIGHT ANKLE - 2 VIEW COMPARISON:  None. FINDINGS: Frontal and lateral views were obtained. There is soft tissue swelling. No evident fracture or joint effusion. Joint spaces appear normal. No erosive change. Ankle mortise appears intact. IMPRESSION: Soft tissue swelling. No fracture evident. No appreciable arthropathy. Ankle mortise appears intact. Electronically Signed   By: Lowella Grip III M.D.   On: 11/15/2019 14:28   Ct Head Wo Contrast  Result Date: 11/15/2019 CLINICAL DATA:  Patient with increased weakness.  Dementia.  Fall. EXAM: CT HEAD WITHOUT CONTRAST CT CERVICAL SPINE WITHOUT CONTRAST TECHNIQUE: Multidetector CT imaging of the head and cervical spine was performed following the standard protocol without intravenous contrast. Multiplanar CT image reconstructions of the cervical spine were also generated. COMPARISON:  CT brain 05/15/2016 FINDINGS: CT HEAD FINDINGS Brain: Ventricles and sulci are appropriate for  patient's age. No evidence for acute cortically based infarct, intracranial hemorrhage, mass lesion or mass-effect. Vascular: Unremarkable Skull: Intact. Sinuses/Orbits: Paranasal sinuses are well aerated. Mastoid air cells are unremarkable. Orbits are unremarkable. Other: None. CT CERVICAL SPINE FINDINGS Alignment: Straightening of the normal cervical lordosis. Skull base and vertebrae: No acute fracture. No primary bone lesion or focal pathologic process. Soft tissues and spinal canal: No prevertebral fluid or swelling. No visible canal hematoma. Disc levels: Multilevel degenerative disc disease most pronounced C3-4, C4-5 and C5-6. No acute fracture. Upper chest: Minimal patchy opacities within the right upper lobe. Other: None. IMPRESSION: No acute intracranial process. No acute cervical spine fracture. Mild patchy opacities within the right upper lobe are nonspecific however may be secondary to an infectious/inflammatory process. Consider follow-up chest CT in 4-6 weeks to ensure resolution. Electronically Signed   By: Lovey Newcomer M.D.   On: 11/15/2019 14:03   Ct Cervical Spine Wo Contrast  Result Date: 11/15/2019 CLINICAL DATA:  Patient with increased weakness.  Dementia.  Fall. EXAM: CT HEAD WITHOUT CONTRAST CT CERVICAL SPINE WITHOUT CONTRAST TECHNIQUE: Multidetector CT imaging of the head and cervical spine was performed following the standard protocol without intravenous contrast. Multiplanar CT image reconstructions of the cervical spine were also generated. COMPARISON:  CT brain  05/15/2016 FINDINGS: CT HEAD FINDINGS Brain: Ventricles and sulci are appropriate for patient's age. No evidence for acute cortically based infarct, intracranial hemorrhage, mass lesion or mass-effect. Vascular: Unremarkable Skull: Intact. Sinuses/Orbits: Paranasal sinuses are well aerated. Mastoid air cells are unremarkable. Orbits are unremarkable. Other: None. CT CERVICAL SPINE FINDINGS Alignment: Straightening of the  normal cervical lordosis. Skull base and vertebrae: No acute fracture. No primary bone lesion or focal pathologic process. Soft tissues and spinal canal: No prevertebral fluid or swelling. No visible canal hematoma. Disc levels: Multilevel degenerative disc disease most pronounced C3-4, C4-5 and C5-6. No acute fracture. Upper chest: Minimal patchy opacities within the right upper lobe. Other: None. IMPRESSION: No acute intracranial process. No acute cervical spine fracture. Mild patchy opacities within the right upper lobe are nonspecific however may be secondary to an infectious/inflammatory process. Consider follow-up chest CT in 4-6 weeks to ensure resolution. Electronically Signed   By: Lovey Newcomer M.D.   On: 11/15/2019 14:03   Mr Brain Wo Contrast  Result Date: 11/15/2019 CLINICAL DATA:  Ataxia with stroke suspected EXAM: MRI HEAD WITHOUT CONTRAST TECHNIQUE: Multiplanar, multiecho pulse sequences of the brain and surrounding structures were obtained without intravenous contrast. COMPARISON:  Head CT from earlier today FINDINGS: Brain: No acute infarction, hemorrhage, hydrocephalus, extra-axial collection or mass lesion. Mild for age cerebral volume loss. Vascular: Normal flow voids Skull and upper cervical spine: Nodes normal marrow signal. C3-4 and C4-5 degenerative disc narrowing. Sinuses/Orbits: Negative IMPRESSION: Unremarkable brain MRI.  No infarct or other acute finding. Electronically Signed   By: Monte Fantasia M.D.   On: 11/15/2019 19:15   Ct Abdomen Pelvis W Contrast  Result Date: 11/15/2019 CLINICAL DATA:  Weakness and lethargy. EXAM: CT ABDOMEN AND PELVIS WITH CONTRAST TECHNIQUE: Multidetector CT imaging of the abdomen and pelvis was performed using the standard protocol following bolus administration of intravenous contrast. CONTRAST:  74m OMNIPAQUE IOHEXOL 300 MG/ML  SOLN COMPARISON:  05/07/2018 FINDINGS: Lower chest: No acute abnormality. Hepatobiliary: No focal liver abnormality is  seen. No gallstones, gallbladder wall thickening, or biliary dilatation. Pancreas: Stable atrophic and fatty replaced pancreas. No evidence of pancreatic inflammation Spleen: Normal in size without focal abnormality. Adrenals/Urinary Tract: Adrenal glands are unremarkable. Kidneys are normal, without renal calculi, focal lesion, or hydronephrosis. Bladder is unremarkable. Stomach/Bowel: Moderate fecal material in the colon without evidence of bowel obstruction. No free air. No focal lesion identified. Vascular/Lymphatic: No significant vascular findings are present. No enlarged abdominal or pelvic lymph nodes. Reproductive: Status post hysterectomy. No adnexal masses. Other: No abdominal wall hernia or abnormality. No abdominopelvic ascites. Musculoskeletal: No acute findings. Stable mild loss of height of the L1 vertebral body anteriorly. IMPRESSION: No acute findings in the abdomen or pelvis. Stable atrophic and fatty replaced pancreas. Electronically Signed   By: GAletta EdouardM.D.   On: 11/15/2019 14:09   [place] Bilateral (at [facility] Only)  Result Date: 11/16/2019 CLINICAL DATA:  Stroke.  Hypertension EXAM: BILATERAL CAROTID DUPLEX ULTRASOUND TECHNIQUE: GPearline Cablesscale imaging, color Doppler and duplex ultrasound were performed of bilateral carotid and vertebral arteries in the neck. COMPARISON:  02/17/2015 by report only FINDINGS: Criteria: Quantification of carotid stenosis is based on velocity parameters that correlate the residual internal carotid diameter with NASCET-based stenosis levels, using the diameter of the distal internal carotid lumen as the denominator for stenosis measurement. The following velocity measurements were obtained: RIGHT ICA: 62/17 cm/sec CCA: 645/80cm/sec SYSTOLIC ICA/CCA RATIO:  0.9 ECA: 35 cm/sec LEFT ICA: 125/41 cm/sec (  probably overestimated due to poor angle correction) CCA: 02/40 cm/sec SYSTOLIC ICA/CCA RATIO:  2.0 ECA: 33 cm/sec RIGHT CAROTID ARTERY: Intimal  thickening in the common carotid artery. Eccentric partially calcified plaque in the bulb without high-grade stenosis. Normal waveforms and color Doppler signal. RIGHT VERTEBRAL ARTERY:  Normal flow direction and waveform. LEFT CAROTID ARTERY: Mild intimal thickening. Mild eccentric plaque in the proximal ICA. No high-grade stenosis. Normal waveforms and color Doppler signal. LEFT VERTEBRAL ARTERY:  Normal flow direction and waveform. IMPRESSION: 1. Mild bilateral carotid plaque resulting in less than 50% diameter ICA stenosis. 2.  Antegrade bilateral vertebral arterial flow. Electronically Signed   By: Lucrezia Europe M.D.   On: 11/16/2019 07:18   Ct L-spine No Charge  Result Date: 11/15/2019 CLINICAL DATA:  Increasing weakness over the past 2 weeks. Multiple falls. Initial encounter. EXAM: CT LUMBAR SPINE WITHOUT CONTRAST TECHNIQUE: Multidetector CT imaging of the lumbar spine was performed without intravenous contrast administration. Multiplanar CT image reconstructions were also generated. COMPARISON:  MRI lumbar spine 07/24/2018. FINDINGS: Segmentation: Standard. Alignment: Maintained. Vertebrae: No acute abnormality. Remote L1 superior endplate compression fracture with vertebral body height loss of approximately 25% is unchanged. Paraspinal and other soft tissues: See report of dedicated CT abdomen and pelvis this same day. Disc levels: T11-12: Minimal retropulsion off the superior endplate of L1. No stenosis. L1-2: Mild disc bulge. No stenosis. L2-3: Negative. L3-4: There is loss of disc space height and vacuum disc phenomenon. Calcified right paracentral protrusion with cephalad extension is again seen. No stenosis. L4-5: Mild facet arthropathy. Otherwise negative. L5-S1: Bilateral facet degenerative change is worse on the left. No stenosis. IMPRESSION: 1. Negative for fracture or other acute abnormality. 2. Remote L1 superior endplate compression fracture with vertebral body height loss of approximately  25%, unchanged. 3. Degenerative disease appearing worst at L3-4 where there is a calcified right paracentral protrusion with cephalad extension without central canal or foraminal stenosis. Electronically Signed   By: Inge Rise M.D.   On: 11/15/2019 13:59   Dg Chest Portable 1 View  Result Date: 11/15/2019 CLINICAL DATA:  Patient with weakness. EXAM: PORTABLE CHEST 1 VIEW COMPARISON:  Chest radiograph 09/07/2019 FINDINGS: The heart size and mediastinal contours are within normal limits. Both lungs are clear. The visualized skeletal structures are unremarkable. IMPRESSION: No active disease. Electronically Signed   By: Lovey Newcomer M.D.   On: 11/15/2019 13:15   Dg Knee Complete 4 Views Right  Result Date: 11/15/2019 CLINICAL DATA:  Pain after fall EXAM: RIGHT KNEE - COMPLETE 4+ VIEW COMPARISON:  None. FINDINGS: The patella, distal femur, and proximal tibia are intact. A lucency through the proximal fibular diaphysis is subtle but consistent with a nondisplaced fracture. IMPRESSION: Subtle nondisplaced fracture through the proximal fibula. No other acute abnormalities. Electronically Signed   By: Dorise Bullion III M.D   On: 11/15/2019 13:17   Dg Ankle Right Port  Result Date: 11/15/2019 CLINICAL DATA:  Pain after fall EXAM: PORTABLE RIGHT ANKLE - 2 VIEW COMPARISON:  November 15, 2019 FINDINGS: There is a fracture through the medial malleolus without displacement. There is a suspected subtle fracture through the fibular metaphysis. IMPRESSION: 1. Medial malleolar fracture. 2. Suspected subtle fracture through the fibular metaphysis. Electronically Signed   By: Dorise Bullion III M.D   On: 11/15/2019 15:16   Ct Head Code Stroke Wo Contrast`  Addendum Date: 11/16/2019   ADDENDUM REPORT: 11/16/2019 01:40 ADDENDUM: Study discussed by telephone with NP Rufina Falco on 11/16/2019 at 0134 hours.  Electronically Signed   By: Genevie Ann M.D.   On: 11/16/2019 01:40   Result Date: 11/16/2019 CLINICAL  DATA:  Code stroke. 68 year old female with worsening mental status, now unresponsive. EXAM: CT HEAD WITHOUT CONTRAST TECHNIQUE: Contiguous axial images were obtained from the base of the skull through the vertex without intravenous contrast. COMPARISON:  Head CT and brain MRI yesterday. FINDINGS: Brain: No midline shift, ventriculomegaly, mass effect, evidence of mass lesion, intracranial hemorrhage or evidence of cortically based acute infarction. Stable and negative gray-white matter differentiation throughout the brain. Vascular: No suspicious intracranial vascular hyperdensity. Skull: Stable, negative. Sinuses/Orbits: Visualized paranasal sinuses and mastoids are stable and well pneumatized. Other: Visualized orbits and scalp soft tissues are within normal limits. ASPECTS [facility] Stroke Program Early CT Score) Total score (0-10 with 10 being normal): 10 IMPRESSION: 1. Stable and negative non-contrast CT appearance of the brain. 2. ASPECTS 10. Electronically Signed: By: Genevie Ann M.D. On: 11/16/2019 01:32    Scheduled Meds: .  stroke: mapping our early stages of recovery book   Does not apply Once  . amitriptyline  10 mg Oral QHS  . aspirin EC  81 mg Oral Daily  . azithromycin  250 mg Oral Daily  . clonazePAM  0.5 mg Oral QHS  . divalproex  125 mg Oral BID  . donepezil  10 mg Oral QHS  . enoxaparin (LOVENOX) injection  40 mg Subcutaneous Q24H  . levothyroxine  50 mcg Oral QAC breakfast  . Melatonin  10 mg Oral QHS  . mirtazapine  7.5 mg Oral QHS  . pantoprazole  40 mg Oral Daily   Continuous Infusions: . sodium chloride 50 mL/hr at 11/16/19 0847  . cefTRIAXone (ROCEPHIN)  IV      Assessment/Plan:  1. Acute metabolic encephalopathy with slurred speech.  Patient having falls and dropping things at home.  MRI of the brain negative for stroke.  Appreciate neurology consultation.  Continue to hold gabapentin.  Decrease dose of amitriptyline and Remeron.  Decrease dose of clonazepam at night.   Treat infection. 2. Acute kidney injury on chronic kidney disease.  Gentle IV fluid hydration.  Creatinine about the same today. 3. Relative hypotension with history of hypertension.  Hold clonidine and Toprol at this time. 4. Acute cystitis without hematuria and possibility of pneumonia.  On Rocephin and Zithromax.  Follow-up urine culture. 5. Right ankle fracture and proximal fibula fracture.  Tylenol only for pain.  Orthopedic consult. 6. Hypothyroidism unspecified.  On levothyroxine.  Add on a TSH. 7. Insomnia, anxiety depression.  Depakote level normal.  Decrease dose of Remeron, amitriptyline and Klonopin. 8. Alzheimer's dementia on Aricept.  Code Status:     Code Status Orders  (From admission, onward)         Start     Ordered   11/15/19 1534  Full code  Continuous     11/15/19 1536        Code Status History    This patient has a current code status but no historical code status.   Advance Care Planning Activity     Family Communication: Spoke with son at the bedside Disposition Plan: To be determined  Consultants:  Orthopedic surgery  Neurology  Antibiotics:  Rocephin  Zithromax  Time spent: 28 minutes  [place]

## 2019-11-16 NOTE — ED Notes (Signed)
Patient taken off non-rebreather and placed on 2L [place] by respiratory therapist.

## 2019-11-16 NOTE — Progress Notes (Signed)
    BRIEF OVERNIGHT PROGRESS REPORT  SUBJECTIVE: Patient had an episode of unresponsiveness with fixed pupils and gaze. She was not responding to noxious stimuli applied by ED physician who was at the bedside. Patient was quickly placed on a non-rebreathe due to concerns for airway protection with plan to intubate if she unable to protect airway.   OBJECTIVE:On arrival to the bedside in the ED, patient was unresponsive and noted to posturing with fixed pupil. I applied nail bed pressure and she was immediately able to grimace. She was also able to weakly withdraw from noxious stimuli. Patient remained non-verbal and unable to follow commands for several minutes. When she returned from CT head, she was slowing becoming to open her eyes and follow simple commands such as showing thumb and wiggling toes. Vital signs remained stable throughout the episode which lasted approximately 30 minutes.  ASSESSMENT: 68 y.o female with a known history of hypertension, Mnire's disease, dementia presentsAcute metabolic encephalopathy with slurred speech  PLAN: Transient episode of unresponsiveness - Per patient's daughter in-law who is her POA stated had similar episode about 2 days. Her medications including amitriptyline, donepezil, and Remeron held.  We will also hold benzodiazepine for now until mental status improves. - ABGs obtained which was unremarkable - Repeat Head CT again did not demonstrated any acute abnormality - Will check EEG - Recommend Neurology Consult for further evaluation of possible seizures given stereotypical findings on exam -Continue with management of current underlying medical condition.    Rufina Falco, DNP, CCRN, FNP-C [facility] Nurse Practitioner Between 7pm to 7am - Pager [phone]  After 7am go to [email or web address] - [email or web address] [facility]  [facility] [employer]  [phone]
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
## 2019-11-16 NOTE — ED Notes (Signed)
Admitting MD at bedside. Patient now responsive to both verbal and painful stimuli. Patient able to make purposeful movements.

## 2019-11-16 NOTE — ED Notes (Addendum)
RN to room for rounding. Patient with eyes partially open, and not responding to verbal stimuli. Patient sternal rubbed, patient unresponsive to both verbal and painful stimuli. MD Owens Shark and charge RN to bedside. Admitting MD paged. Patient without pupillary and gag reflexes. Patient placed on non-rebreather at 15L.

## 2019-11-16 NOTE — Progress Notes (Signed)
eeg completed ° °
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
## 2019-11-17 DIAGNOSIS — F0280 Dementia in other diseases classified elsewhere without behavioral disturbance: Secondary | ICD-10-CM

## 2019-11-17 DIAGNOSIS — E039 Hypothyroidism, unspecified: Secondary | ICD-10-CM

## 2019-11-17 DIAGNOSIS — G309 Alzheimer's disease, unspecified: Secondary | ICD-10-CM

## 2019-11-17 LAB — CBC
HCT: 35.1 % — ABNORMAL LOW (ref 36.0–46.0)
Hemoglobin: 11 g/dL — ABNORMAL LOW (ref 12.0–15.0)
MCH: 31.8 pg (ref 26.0–34.0)
MCHC: 31.3 g/dL (ref 30.0–36.0)
MCV: 101.4 fL — ABNORMAL HIGH (ref 80.0–100.0)
Platelets: 166 10*3/uL (ref 150–400)
RBC: 3.46 MIL/uL — ABNORMAL LOW (ref 3.87–5.11)
RDW: 14.8 % (ref 11.5–15.5)
WBC: 14.1 10*3/uL — ABNORMAL HIGH (ref 4.0–10.5)
nRBC: 0 % (ref 0.0–0.2)

## 2019-11-17 LAB — BASIC METABOLIC PANEL
Anion gap: 8 (ref 5–15)
BUN: 25 mg/dL — ABNORMAL HIGH (ref 8–23)
CO2: 19 mmol/L — ABNORMAL LOW (ref 22–32)
Calcium: 7.3 mg/dL — ABNORMAL LOW (ref 8.9–10.3)
Chloride: 112 mmol/L — ABNORMAL HIGH (ref 98–111)
Creatinine, Ser: 1.35 mg/dL — ABNORMAL HIGH (ref 0.44–1.00)
GFR calc Af Amer: 46 mL/min — ABNORMAL LOW (ref >60)
GFR calc non Af Amer: 40 mL/min — ABNORMAL LOW (ref >60)
Glucose, Bld: 106 mg/dL — ABNORMAL HIGH (ref 70–99)
Potassium: 3.7 mmol/L (ref 3.5–5.1)
Sodium: 139 mmol/L (ref 135–145)

## 2019-11-17 LAB — PROCALCITONIN: Procalcitonin: 0.13 ng/mL

## 2019-11-17 NOTE — Progress Notes (Signed)
Subjective: Patient more alert today.  More oriented.  No further episodes of unresponsiveness.    Objective: Current vital signs: BP (!) 147/54 (BP Location: Right Arm)   Pulse (!) 104   Temp 98.4 F (36.9 C) (Oral)   Resp 18   Ht 5' 6"  (1.676 m)   Wt 79.4 kg   SpO2 99%   BMI 28.25 kg/m  Vital signs in last 24 hours: Temp:  [98.2 F (36.8 C)-99.6 F (37.6 C)] 98.4 F (36.9 C) (12/01 0800) Pulse Rate:  [92-104] 104 (12/01 0800) Resp:  [16-20] 18 (12/01 0800) BP: (93-147)/(49-70) 147/54 (12/01 0800) SpO2:  [95 %-100 %] 99 % (12/01 0800)  Intake/Output from previous day: 11/30 0701 - 12/01 0700 In: 1498.8 [I.V.:1398.8; IV Piggyback:100] Out: -  Intake/Output this shift: Total I/O In: 240 [P.O.:240] Out: 825 [Urine:825] Nutritional status:  Diet Order            Diet Heart Room service appropriate? Yes with Assist; Fluid consistency: Thin  Diet effective now              Neurologic Exam: Mental Status: Alert.  Oriented to name, place and the year.  Speech fluent without evidence of aphasia.  Follows commands. Cranial Nerves: II: Visual fields grossly normal, pupils equal, round, reactive to light and accommodation III,IV, VI: ptosis not present, extra-ocular motions intact bilaterally V,VII: smile symmetric, facial light touch sensation normal bilaterally VIII: hearing normal bilaterally IX,X: gag reflex present XI: bilateral shoulder shrug XII: midline tongue extension Motor: Has generalized weakness but able to lift all extremities against gravity and maintain excluding the RLE which is bandaged Sensory: Pinprick and light touch intact throughout, bilaterally   Lab Results: Basic Metabolic Panel: Recent Labs  Lab 11/15/19 1112 11/16/19 0438 11/17/19 0518  NA 140 138 139  K 4.1 3.4* 3.7  CL 107 107 112*  CO2 23 20* 19*  GLUCOSE 111* 97 106*  BUN 20 21 25*  CREATININE 1.43* 1.41* 1.35*  CALCIUM 8.5* 7.5* 7.3*    Liver Function Tests: Recent  Labs  Lab 11/15/19 1112  AST 30  ALT 23  ALKPHOS 113  BILITOT 0.9  PROT 7.2  ALBUMIN 3.5   Recent Labs  Lab 11/15/19 1112  LIPASE 16   No results for input(s): AMMONIA in the last 168 hours.  CBC: Recent Labs  Lab 11/15/19 1112 11/17/19 0518  WBC 16.0* 14.1*  NEUTROABS 13.7*  --   HGB 13.5 11.0*  HCT 40.7 35.1*  MCV 96.7 101.4*  PLT 244 166    Cardiac Enzymes: No results for input(s): CKTOTAL, CKMB, CKMBINDEX, TROPONINI in the last 168 hours.  Lipid Panel: Recent Labs  Lab 11/16/19 0438  CHOL 114  TRIG 85  HDL 44  CHOLHDL 2.6  VLDL 17  LDLCALC 53    CBG: Recent Labs  Lab 11/16/19 0054  GLUCAP 136*    Microbiology: Results for orders placed or performed during the hospital encounter of 11/15/19  SARS CORONAVIRUS 2 (TAT 6-24 HRS) Nasopharyngeal Nasopharyngeal Swab     Status: None   Collection Time: 11/15/19  3:21 PM   Specimen: Nasopharyngeal Swab  Result Value Ref Range Status   SARS Coronavirus 2 NEGATIVE NEGATIVE Final    Comment: (NOTE) SARS-CoV-2 target nucleic acids are NOT DETECTED. The SARS-CoV-2 RNA is generally detectable in upper and lower respiratory specimens during the acute phase of infection. Negative results do not preclude SARS-CoV-2 infection, do not rule out co-infections with other pathogens, and should not  be used as the sole basis for treatment or other patient management decisions. Negative results must be combined with clinical observations, patient history, and epidemiological information. The expected result is Negative. Fact Sheet for Patients: SugarRoll.be Fact Sheet for Healthcare Providers: https://www.woods-mathews.com/ This test is not yet approved or cleared by the [place] FDA and  has been authorized for detection and/or diagnosis of SARS-CoV-2 by FDA under an Emergency Use Authorization (EUA). This EUA will remain  in effect (meaning this test can be used) for  the duration of the COVID-19 declaration under Section 56 4(b)(1) of the Act, 21 U.S.C. section 360bbb-3(b)(1), unless the authorization is terminated or revoked sooner. Performed at [facility], [place] [address], [place], [place] [address]     Coagulation Studies: No results for input(s): LABPROT, INR in the last 72 hours.  Imaging: Dg Chest 1 View  Result Date: 11/16/2019 CLINICAL DATA:  Falls.  Possible pneumonia.  Possible CVA. EXAM: CHEST  1 VIEW COMPARISON:  11/15/2019. FINDINGS: Mediastinum hilar structures normal. Mild right perihilar atelectasis/infiltrate. Mild left base subsegmental atelectasis. Tiny left pleural effusion cannot be excluded. No pneumothorax. Heart size normal. No acute bony abnormality identified. IMPRESSION: Mild right perihilar atelectasis/infiltrate. Mild left base subsegmental atelectasis. Tiny left pleural effusion cannot be excluded. Electronically Signed   By: Marcello Moores  Register   On: 11/16/2019 07:01   Dg Ankle 2 Views Right  Result Date: 11/15/2019 CLINICAL DATA:  Pain following fall EXAM: RIGHT ANKLE - 2 VIEW COMPARISON:  None. FINDINGS: Frontal and lateral views were obtained. There is soft tissue swelling. No evident fracture or joint effusion. Joint spaces appear normal. No erosive change. Ankle mortise appears intact. IMPRESSION: Soft tissue swelling. No fracture evident. No appreciable arthropathy. Ankle mortise appears intact. Electronically Signed   By: Lowella Grip III M.D.   On: 11/15/2019 14:28   Ct Head Wo Contrast  Result Date: 11/15/2019 CLINICAL DATA:  Patient with increased weakness.  Dementia.  Fall. EXAM: CT HEAD WITHOUT CONTRAST CT CERVICAL SPINE WITHOUT CONTRAST TECHNIQUE: Multidetector CT imaging of the head and cervical spine was performed following the standard protocol without intravenous contrast. Multiplanar CT image reconstructions of the cervical spine were also generated. COMPARISON:  CT brain 05/15/2016 FINDINGS: CT  HEAD FINDINGS Brain: Ventricles and sulci are appropriate for patient's age. No evidence for acute cortically based infarct, intracranial hemorrhage, mass lesion or mass-effect. Vascular: Unremarkable Skull: Intact. Sinuses/Orbits: Paranasal sinuses are well aerated. Mastoid air cells are unremarkable. Orbits are unremarkable. Other: None. CT CERVICAL SPINE FINDINGS Alignment: Straightening of the normal cervical lordosis. Skull base and vertebrae: No acute fracture. No primary bone lesion or focal pathologic process. Soft tissues and spinal canal: No prevertebral fluid or swelling. No visible canal hematoma. Disc levels: Multilevel degenerative disc disease most pronounced C3-4, C4-5 and C5-6. No acute fracture. Upper chest: Minimal patchy opacities within the right upper lobe. Other: None. IMPRESSION: No acute intracranial process. No acute cervical spine fracture. Mild patchy opacities within the right upper lobe are nonspecific however may be secondary to an infectious/inflammatory process. Consider follow-up chest CT in 4-6 weeks to ensure resolution. Electronically Signed   By: Lovey Newcomer M.D.   On: 11/15/2019 14:03   Ct Cervical Spine Wo Contrast  Result Date: 11/15/2019 CLINICAL DATA:  Patient with increased weakness.  Dementia.  Fall. EXAM: CT HEAD WITHOUT CONTRAST CT CERVICAL SPINE WITHOUT CONTRAST TECHNIQUE: Multidetector CT imaging of the head and cervical spine was performed following the standard protocol without intravenous contrast.  Multiplanar CT image reconstructions of the cervical spine were also generated. COMPARISON:  CT brain 05/15/2016 FINDINGS: CT HEAD FINDINGS Brain: Ventricles and sulci are appropriate for patient's age. No evidence for acute cortically based infarct, intracranial hemorrhage, mass lesion or mass-effect. Vascular: Unremarkable Skull: Intact. Sinuses/Orbits: Paranasal sinuses are well aerated. Mastoid air cells are unremarkable. Orbits are unremarkable. Other: None. CT  CERVICAL SPINE FINDINGS Alignment: Straightening of the normal cervical lordosis. Skull base and vertebrae: No acute fracture. No primary bone lesion or focal pathologic process. Soft tissues and spinal canal: No prevertebral fluid or swelling. No visible canal hematoma. Disc levels: Multilevel degenerative disc disease most pronounced C3-4, C4-5 and C5-6. No acute fracture. Upper chest: Minimal patchy opacities within the right upper lobe. Other: None. IMPRESSION: No acute intracranial process. No acute cervical spine fracture. Mild patchy opacities within the right upper lobe are nonspecific however may be secondary to an infectious/inflammatory process. Consider follow-up chest CT in 4-6 weeks to ensure resolution. Electronically Signed   By: Lovey Newcomer M.D.   On: 11/15/2019 14:03   Mr Brain Wo Contrast  Result Date: 11/15/2019 CLINICAL DATA:  Ataxia with stroke suspected EXAM: MRI HEAD WITHOUT CONTRAST TECHNIQUE: Multiplanar, multiecho pulse sequences of the brain and surrounding structures were obtained without intravenous contrast. COMPARISON:  Head CT from earlier today FINDINGS: Brain: No acute infarction, hemorrhage, hydrocephalus, extra-axial collection or mass lesion. Mild for age cerebral volume loss. Vascular: Normal flow voids Skull and upper cervical spine: Nodes normal marrow signal. C3-4 and C4-5 degenerative disc narrowing. Sinuses/Orbits: Negative IMPRESSION: Unremarkable brain MRI.  No infarct or other acute finding. Electronically Signed   By: Monte Fantasia M.D.   On: 11/15/2019 19:15   Ct Abdomen Pelvis W Contrast  Result Date: 11/15/2019 CLINICAL DATA:  Weakness and lethargy. EXAM: CT ABDOMEN AND PELVIS WITH CONTRAST TECHNIQUE: Multidetector CT imaging of the abdomen and pelvis was performed using the standard protocol following bolus administration of intravenous contrast. CONTRAST:  50m OMNIPAQUE IOHEXOL 300 MG/ML  SOLN COMPARISON:  05/07/2018 FINDINGS: Lower chest: No acute  abnormality. Hepatobiliary: No focal liver abnormality is seen. No gallstones, gallbladder wall thickening, or biliary dilatation. Pancreas: Stable atrophic and fatty replaced pancreas. No evidence of pancreatic inflammation Spleen: Normal in size without focal abnormality. Adrenals/Urinary Tract: Adrenal glands are unremarkable. Kidneys are normal, without renal calculi, focal lesion, or hydronephrosis. Bladder is unremarkable. Stomach/Bowel: Moderate fecal material in the colon without evidence of bowel obstruction. No free air. No focal lesion identified. Vascular/Lymphatic: No significant vascular findings are present. No enlarged abdominal or pelvic lymph nodes. Reproductive: Status post hysterectomy. No adnexal masses. Other: No abdominal wall hernia or abnormality. No abdominopelvic ascites. Musculoskeletal: No acute findings. Stable mild loss of height of the L1 vertebral body anteriorly. IMPRESSION: No acute findings in the abdomen or pelvis. Stable atrophic and fatty replaced pancreas. Electronically Signed   By: GAletta EdouardM.D.   On: 11/15/2019 14:09   [place]otid Bilateral (at [facility] Only)  Result Date: 11/16/2019 CLINICAL DATA:  Stroke.  Hypertension EXAM: BILATERAL CAROTID DUPLEX ULTRASOUND TECHNIQUE: GPearline Cablesscale imaging, color Doppler and duplex ultrasound were performed of bilateral carotid and vertebral arteries in the neck. COMPARISON:  02/17/2015 by report only FINDINGS: Criteria: Quantification of carotid stenosis is based on velocity parameters that correlate the residual internal carotid diameter with NASCET-based stenosis levels, using the diameter of the distal internal carotid lumen as the denominator for stenosis measurement. The following velocity measurements were obtained: RIGHT ICA: 62/17 cm/sec  CCA: 70/96 cm/sec SYSTOLIC ICA/CCA RATIO:  0.9 ECA: 35 cm/sec LEFT ICA: 125/41 cm/sec (probably overestimated due to poor angle correction) CCA: 28/36 cm/sec SYSTOLIC ICA/CCA RATIO:   2.0 ECA: 33 cm/sec RIGHT CAROTID ARTERY: Intimal thickening in the common carotid artery. Eccentric partially calcified plaque in the bulb without high-grade stenosis. Normal waveforms and color Doppler signal. RIGHT VERTEBRAL ARTERY:  Normal flow direction and waveform. LEFT CAROTID ARTERY: Mild intimal thickening. Mild eccentric plaque in the proximal ICA. No high-grade stenosis. Normal waveforms and color Doppler signal. LEFT VERTEBRAL ARTERY:  Normal flow direction and waveform. IMPRESSION: 1. Mild bilateral carotid plaque resulting in less than 50% diameter ICA stenosis. 2.  Antegrade bilateral vertebral arterial flow. Electronically Signed   By: Lucrezia Europe M.D.   On: 11/16/2019 07:18   Ct L-spine No Charge  Result Date: 11/15/2019 CLINICAL DATA:  Increasing weakness over the past 2 weeks. Multiple falls. Initial encounter. EXAM: CT LUMBAR SPINE WITHOUT CONTRAST TECHNIQUE: Multidetector CT imaging of the lumbar spine was performed without intravenous contrast administration. Multiplanar CT image reconstructions were also generated. COMPARISON:  MRI lumbar spine 07/24/2018. FINDINGS: Segmentation: Standard. Alignment: Maintained. Vertebrae: No acute abnormality. Remote L1 superior endplate compression fracture with vertebral body height loss of approximately 25% is unchanged. Paraspinal and other soft tissues: See report of dedicated CT abdomen and pelvis this same day. Disc levels: T11-12: Minimal retropulsion off the superior endplate of L1. No stenosis. L1-2: Mild disc bulge. No stenosis. L2-3: Negative. L3-4: There is loss of disc space height and vacuum disc phenomenon. Calcified right paracentral protrusion with cephalad extension is again seen. No stenosis. L4-5: Mild facet arthropathy. Otherwise negative. L5-S1: Bilateral facet degenerative change is worse on the left. No stenosis. IMPRESSION: 1. Negative for fracture or other acute abnormality. 2. Remote L1 superior endplate compression fracture  with vertebral body height loss of approximately 25%, unchanged. 3. Degenerative disease appearing worst at L3-4 where there is a calcified right paracentral protrusion with cephalad extension without central canal or foraminal stenosis. Electronically Signed   By: Inge Rise M.D.   On: 11/15/2019 13:59   Dg Chest Portable 1 View  Result Date: 11/15/2019 CLINICAL DATA:  Patient with weakness. EXAM: PORTABLE CHEST 1 VIEW COMPARISON:  Chest radiograph 09/07/2019 FINDINGS: The heart size and mediastinal contours are within normal limits. Both lungs are clear. The visualized skeletal structures are unremarkable. IMPRESSION: No active disease. Electronically Signed   By: Lovey Newcomer M.D.   On: 11/15/2019 13:15   Dg Knee Complete 4 Views Right  Result Date: 11/15/2019 CLINICAL DATA:  Pain after fall EXAM: RIGHT KNEE - COMPLETE 4+ VIEW COMPARISON:  None. FINDINGS: The patella, distal femur, and proximal tibia are intact. A lucency through the proximal fibular diaphysis is subtle but consistent with a nondisplaced fracture. IMPRESSION: Subtle nondisplaced fracture through the proximal fibula. No other acute abnormalities. Electronically Signed   By: Dorise Bullion III M.D   On: 11/15/2019 13:17   Dg Ankle Right Port  Result Date: 11/15/2019 CLINICAL DATA:  Pain after fall EXAM: PORTABLE RIGHT ANKLE - 2 VIEW COMPARISON:  November 15, 2019 FINDINGS: There is a fracture through the medial malleolus without displacement. There is a suspected subtle fracture through the fibular metaphysis. IMPRESSION: 1. Medial malleolar fracture. 2. Suspected subtle fracture through the fibular metaphysis. Electronically Signed   By: Dorise Bullion III M.D   On: 11/15/2019 15:16   Ct Head Code Stroke Wo Contrast`  Addendum Date: 11/16/2019   ADDENDUM REPORT: 11/16/2019  01:40 ADDENDUM: Study discussed by telephone with NP Rufina Falco on 11/16/2019 at 0134 hours. Electronically Signed   By: Genevie Ann M.D.   On:  11/16/2019 01:40   Result Date: 11/16/2019 CLINICAL DATA:  Code stroke. 68 year old female with worsening mental status, now unresponsive. EXAM: CT HEAD WITHOUT CONTRAST TECHNIQUE: Contiguous axial images were obtained from the base of the skull through the vertex without intravenous contrast. COMPARISON:  Head CT and brain MRI yesterday. FINDINGS: Brain: No midline shift, ventriculomegaly, mass effect, evidence of mass lesion, intracranial hemorrhage or evidence of cortically based acute infarction. Stable and negative gray-white matter differentiation throughout the brain. Vascular: No suspicious intracranial vascular hyperdensity. Skull: Stable, negative. Sinuses/Orbits: Visualized paranasal sinuses and mastoids are stable and well pneumatized. Other: Visualized orbits and scalp soft tissues are within normal limits. ASPECTS [facility] Stroke Program Early CT Score) Total score (0-10 with 10 being normal): 10 IMPRESSION: 1. Stable and negative non-contrast CT appearance of the brain. 2. ASPECTS 10. Electronically Signed: By: Genevie Ann M.D. On: 11/16/2019 01:32    Medications:  I have reviewed the patient's current medications. Scheduled: . amitriptyline  10 mg Oral QHS  . aspirin EC  81 mg Oral Daily  . azithromycin  250 mg Oral Daily  . clonazePAM  0.5 mg Oral QHS  . divalproex  125 mg Oral BID  . donepezil  10 mg Oral QHS  . enoxaparin (LOVENOX) injection  40 mg Subcutaneous Q24H  . levothyroxine  50 mcg Oral QAC breakfast  . pantoprazole  40 mg Oral Daily    Assessment/Plan: 68 year old female with a history of dementia who presents with falls, altered mental status and facial droop with slurred speech. MRI of the brain reviewed and shows no acute changes.  Patient with UTI and possible PNA which are likely contributing to falls, altered mental status and may be unmasking some mild neurological symptoms as well.  Patient also with an episode of unresponsiveness last evening.  This may very  well be related to her cocktail of medications.  Today mental status improved.  No further episodes of unresponsiveness.  EEG showed no epileptiform activity.  With improvement no further neurological  intervention recommended at this time.  Recommendations: 1.  Will continue to follow with you.   LOS: 2 days   Alexis Goodell, MD Neurology [phone] 11/17/2019  11:57 AM

## 2019-11-17 NOTE — TOC Initial Note (Signed)
Transition of Care [facility]) - Initial/Assessment Note    Patient Details  Name: Valerie Hall MRN: [id number] Date of Birth: [birth year]  [facility]) CM/SW Contact:    Shelbie Hutching, RN Phone Number: 11/17/2019, 2:44 PM  Clinical Narrative:                 Patient admitted after a fall at home, found to have a right ankle fracture and right fibula fracture.  Patient also has a history of dementia.  Patient currently lives with her son and daughter in law.  Patient reports that she walks without assistance at home but that she does have a wheelchair.  Patient's fractures are non weight bearing and she may need SNF, waiting on PT recommendations.   Expected Discharge Plan: Skilled Nursing Facility Barriers to Discharge: Continued Medical Work up   Patient Goals and CMS Choice        Expected Discharge Plan and Services Expected Discharge Plan: [place]   Discharge Planning Services: CM Consult   Living arrangements for the past 2 months: Single Family Home                                      Prior Living Arrangements/Services Living arrangements for the past 2 months: Single Family Home Lives with:: Adult Children Patient language and need for interpreter reviewed:: Yes Do you feel safe going back to the place where you live?: Yes      Need for Family Participation in Patient Care: Yes (Comment) Care giver support system in place?: Yes (comment)(Son and daughter in law) Current home services: DME(wheelchair, walker) Criminal Activity/Legal Involvement Pertinent to Current Situation/Hospitalization: No - Comment as needed  Activities of Daily Living Home Assistive Devices/Equipment: None ADL Screening (condition at time of admission) Patient's cognitive ability adequate to safely complete daily activities?: Yes Is the patient deaf or have difficulty hearing?: No Does the patient have difficulty seeing, even when wearing  glasses/contacts?: No Does the patient have difficulty concentrating, remembering, or making decisions?: No Patient able to express need for assistance with ADLs?: No Does the patient have difficulty dressing or bathing?: No Independently performs ADLs?: Yes (appropriate for developmental age) Does the patient have difficulty walking or climbing stairs?: No Weakness of Legs: None Weakness of Arms/Hands: None  Permission Sought/Granted Permission sought to share information with : Case Manager, Family Supports Permission granted to share information with : Yes, Verbal Permission Granted        Permission granted to share info w Relationship: Son Biochemist, clinical     Emotional Assessment Appearance:: Appears stated age Attitude/Demeanor/Rapport: Engaged Affect (typically observed): Accepting Orientation: : Oriented to Self, Oriented to Place, Oriented to  Time, Oriented to Situation Alcohol / Substance Use: Not Applicable Psych Involvement: No (comment)  Admission diagnosis:  Pneumonia [J18.9] CVA (cerebral vascular accident) [facility]) [I63.9] Right ankle pain [M25.571] Lower abdominal pain [R10.30] Leukocytosis, unspecified type [D72.829] Closed fracture of proximal end of right fibula, unspecified fracture morphology, initial encounter [B01.751W] Patient Active Problem List   Diagnosis Date Noted  . Alzheimer's dementia without behavioral disturbance ([place])   . Hypotension   . Acute cystitis without hematuria   . Pneumonia of right upper lobe due to infectious organism   . Closed fracture of right ankle   . Slurred speech 11/15/2019  . Acute metabolic encephalopathy   . Lobar pneumonia ([place])   .  Essential hypertension   . Acute kidney injury superimposed on CKD ([place])   . Closed fracture of upper end of right fibula   . Personal history of colonic polyps   . Benign neoplasm of transverse colon   . Auditory vertigo 04/30/2016  . Irritable bowel syndrome with diarrhea 04/30/2016  .  Clinical depression 04/30/2016  . Degeneration of intervertebral disc of cervical region 04/30/2016  . Crohn's disease ([place]) 04/30/2016  . CFIDS (chronic fatigue and immune dysfunction syndrome) ([place]) 04/30/2016  . Hypothyroidism   . Head revolving around 12/29/2014  . Cephalalgia 12/29/2014  . Excessive falling 12/29/2014  . Appendicular ataxia 12/29/2014  . Neuritis or radiculitis due to rupture of lumbar intervertebral disc 07/13/2014  . Bulge of lumbar disc without myelopathy 07/13/2014   PCP:  Idelle Crouch, MD Pharmacy:   [id number] [place]: [phone]: [phone] [place] [place] [address] [place] [address]Phone: [phone]: [phone]    Social Determinants of Health (SDOH) Interventions    Readmission Risk Interventions No flowsheet data found.

## 2019-11-17 NOTE — Progress Notes (Addendum)
Patient ID: Valerie Hall, female   DOB: 09-Apr-1951, 68 y.o.   MRN: [id number] [facility] PROGRESS NOTE  RANDA RISS OYD:[id number] DOB: Jul 17, 1951 DOA: 11/15/2019 PCP: Idelle Crouch, MD  HPI/Subjective: Patient feeling better.  Answers some questions.  Slept well last night.  Received Toradol last night.  Objective: Vitals:   11/17/19 0353 11/17/19 0800  BP: 132/64 (!) 147/54  Pulse: (!) 102 (!) 104  Resp: 17 18  Temp: 99.6 F (37.6 C) 98.4 F (36.9 C)  SpO2: 95% 99%    Filed Weights   11/15/19 1108  Weight: 79.4 kg    ROS: Review of Systems  Constitutional: Negative for chills and fever.  Eyes: Negative for blurred vision.  Respiratory: Negative for cough and shortness of breath.   Cardiovascular: Negative for chest pain.  Gastrointestinal: Negative for abdominal pain, constipation, diarrhea, nausea and vomiting.  Genitourinary: Negative for dysuria.  Musculoskeletal: Positive for joint pain.  Neurological: Negative for dizziness and headaches.   Exam: Physical Exam  HENT:  Nose: No mucosal edema.  Mouth/Throat: No oropharyngeal exudate or posterior oropharyngeal edema.  Eyes: Pupils are equal, round, and reactive to light. Conjunctivae, EOM and lids are normal.  Neck: No JVD present. Carotid bruit is not present. No edema present. No thyroid mass and no thyromegaly present.  Cardiovascular: S1 normal and S2 normal. Exam reveals no gallop.  No murmur heard. Pulses:      Dorsalis pedis pulses are 2+ on the right side and 2+ on the left side.  Respiratory: No respiratory distress. She has no wheezes. She has no rhonchi. She has no rales.  GI: Soft. Bowel sounds are normal. There is abdominal tenderness in the suprapubic area.  Musculoskeletal:     Left ankle: She exhibits no swelling.     Comments: Right ankle and lower leg in splint.  Lymphadenopathy:    She has no cervical adenopathy.  Neurological: She is alert. No cranial nerve deficit.   Skin: Skin is warm. No rash noted. Nails show no clubbing.  Psychiatric: She has a normal mood and affect.      Data Reviewed: Basic Metabolic Panel: Recent Labs  Lab 11/15/19 1112 11/16/19 0438 11/17/19 0518  NA 140 138 139  K 4.1 3.4* 3.7  CL 107 107 112*  CO2 23 20* 19*  GLUCOSE 111* 97 106*  BUN 20 21 25*  CREATININE 1.43* 1.41* 1.35*  CALCIUM 8.5* 7.5* 7.3*   Liver Function Tests: Recent Labs  Lab 11/15/19 1112  AST 30  ALT 23  ALKPHOS 113  BILITOT 0.9  PROT 7.2  ALBUMIN 3.5   Recent Labs  Lab 11/15/19 1112  LIPASE 16   CBC: Recent Labs  Lab 11/15/19 1112 11/17/19 0518  WBC 16.0* 14.1*  NEUTROABS 13.7*  --   HGB 13.5 11.0*  HCT 40.7 35.1*  MCV 96.7 101.4*  PLT 244 166    CBG: Recent Labs  Lab 11/16/19 0054  GLUCAP 136*    Recent Results (from the past 240 hour(s))  SARS CORONAVIRUS 2 (TAT 6-24 HRS) Nasopharyngeal Nasopharyngeal Swab     Status: None   Collection Time: 11/15/19  3:21 PM   Specimen: Nasopharyngeal Swab  Result Value Ref Range Status   SARS Coronavirus 2 NEGATIVE NEGATIVE Final    Comment: (NOTE) SARS-CoV-2 target nucleic acids are NOT DETECTED. The SARS-CoV-2 RNA is generally detectable in upper and lower respiratory specimens during the acute phase of infection. Negative results do not preclude SARS-CoV-2 infection, do  not rule out co-infections with other pathogens, and should not be used as the sole basis for treatment or other patient management decisions. Negative results must be combined with clinical observations, patient history, and epidemiological information. The expected result is Negative. Fact Sheet for Patients: SugarRoll.be Fact Sheet for Healthcare Providers: https://www.woods-mathews.com/ This test is not yet approved or cleared by the [place] FDA and  has been authorized for detection and/or diagnosis of SARS-CoV-2 by FDA under an Emergency Use  Authorization (EUA). This EUA will remain  in effect (meaning this test can be used) for the duration of the COVID-19 declaration under Section 56 4(b)(1) of the Act, 21 U.S.C. section 360bbb-3(b)(1), unless the authorization is terminated or revoked sooner. Performed at [facility], [place] [address], [place], [place] [address]      Studies: Dg Chest 1 View  Result Date: 11/16/2019 CLINICAL DATA:  Falls.  Possible pneumonia.  Possible CVA. EXAM: CHEST  1 VIEW COMPARISON:  11/15/2019. FINDINGS: Mediastinum hilar structures normal. Mild right perihilar atelectasis/infiltrate. Mild left base subsegmental atelectasis. Tiny left pleural effusion cannot be excluded. No pneumothorax. Heart size normal. No acute bony abnormality identified. IMPRESSION: Mild right perihilar atelectasis/infiltrate. Mild left base subsegmental atelectasis. Tiny left pleural effusion cannot be excluded. Electronically Signed   By: Marcello Moores  Register   On: 11/16/2019 07:01   Dg Ankle 2 Views Right  Result Date: 11/15/2019 CLINICAL DATA:  Pain following fall EXAM: RIGHT ANKLE - 2 VIEW COMPARISON:  None. FINDINGS: Frontal and lateral views were obtained. There is soft tissue swelling. No evident fracture or joint effusion. Joint spaces appear normal. No erosive change. Ankle mortise appears intact. IMPRESSION: Soft tissue swelling. No fracture evident. No appreciable arthropathy. Ankle mortise appears intact. Electronically Signed   By: Lowella Grip III M.D.   On: 11/15/2019 14:28   Ct Head Wo Contrast  Result Date: 11/15/2019 CLINICAL DATA:  Patient with increased weakness.  Dementia.  Fall. EXAM: CT HEAD WITHOUT CONTRAST CT CERVICAL SPINE WITHOUT CONTRAST TECHNIQUE: Multidetector CT imaging of the head and cervical spine was performed following the standard protocol without intravenous contrast. Multiplanar CT image reconstructions of the cervical spine were also generated. COMPARISON:  CT brain 05/15/2016  FINDINGS: CT HEAD FINDINGS Brain: Ventricles and sulci are appropriate for patient's age. No evidence for acute cortically based infarct, intracranial hemorrhage, mass lesion or mass-effect. Vascular: Unremarkable Skull: Intact. Sinuses/Orbits: Paranasal sinuses are well aerated. Mastoid air cells are unremarkable. Orbits are unremarkable. Other: None. CT CERVICAL SPINE FINDINGS Alignment: Straightening of the normal cervical lordosis. Skull base and vertebrae: No acute fracture. No primary bone lesion or focal pathologic process. Soft tissues and spinal canal: No prevertebral fluid or swelling. No visible canal hematoma. Disc levels: Multilevel degenerative disc disease most pronounced C3-4, C4-5 and C5-6. No acute fracture. Upper chest: Minimal patchy opacities within the right upper lobe. Other: None. IMPRESSION: No acute intracranial process. No acute cervical spine fracture. Mild patchy opacities within the right upper lobe are nonspecific however may be secondary to an infectious/inflammatory process. Consider follow-up chest CT in 4-6 weeks to ensure resolution. Electronically Signed   By: Lovey Newcomer M.D.   On: 11/15/2019 14:03   Ct Cervical Spine Wo Contrast  Result Date: 11/15/2019 CLINICAL DATA:  Patient with increased weakness.  Dementia.  Fall. EXAM: CT HEAD WITHOUT CONTRAST CT CERVICAL SPINE WITHOUT CONTRAST TECHNIQUE: Multidetector CT imaging of the head and cervical spine was performed following the standard protocol without intravenous contrast. Multiplanar CT image  reconstructions of the cervical spine were also generated. COMPARISON:  CT brain 05/15/2016 FINDINGS: CT HEAD FINDINGS Brain: Ventricles and sulci are appropriate for patient's age. No evidence for acute cortically based infarct, intracranial hemorrhage, mass lesion or mass-effect. Vascular: Unremarkable Skull: Intact. Sinuses/Orbits: Paranasal sinuses are well aerated. Mastoid air cells are unremarkable. Orbits are unremarkable.  Other: None. CT CERVICAL SPINE FINDINGS Alignment: Straightening of the normal cervical lordosis. Skull base and vertebrae: No acute fracture. No primary bone lesion or focal pathologic process. Soft tissues and spinal canal: No prevertebral fluid or swelling. No visible canal hematoma. Disc levels: Multilevel degenerative disc disease most pronounced C3-4, C4-5 and C5-6. No acute fracture. Upper chest: Minimal patchy opacities within the right upper lobe. Other: None. IMPRESSION: No acute intracranial process. No acute cervical spine fracture. Mild patchy opacities within the right upper lobe are nonspecific however may be secondary to an infectious/inflammatory process. Consider follow-up chest CT in 4-6 weeks to ensure resolution. Electronically Signed   By: Lovey Newcomer M.D.   On: 11/15/2019 14:03   Mr Brain Wo Contrast  Result Date: 11/15/2019 CLINICAL DATA:  Ataxia with stroke suspected EXAM: MRI HEAD WITHOUT CONTRAST TECHNIQUE: Multiplanar, multiecho pulse sequences of the brain and surrounding structures were obtained without intravenous contrast. COMPARISON:  Head CT from earlier today FINDINGS: Brain: No acute infarction, hemorrhage, hydrocephalus, extra-axial collection or mass lesion. Mild for age cerebral volume loss. Vascular: Normal flow voids Skull and upper cervical spine: Nodes normal marrow signal. C3-4 and C4-5 degenerative disc narrowing. Sinuses/Orbits: Negative IMPRESSION: Unremarkable brain MRI.  No infarct or other acute finding. Electronically Signed   By: Monte Fantasia M.D.   On: 11/15/2019 19:15   Ct Abdomen Pelvis W Contrast  Result Date: 11/15/2019 CLINICAL DATA:  Weakness and lethargy. EXAM: CT ABDOMEN AND PELVIS WITH CONTRAST TECHNIQUE: Multidetector CT imaging of the abdomen and pelvis was performed using the standard protocol following bolus administration of intravenous contrast. CONTRAST:  46m OMNIPAQUE IOHEXOL 300 MG/ML  SOLN COMPARISON:  05/07/2018 FINDINGS: Lower  chest: No acute abnormality. Hepatobiliary: No focal liver abnormality is seen. No gallstones, gallbladder wall thickening, or biliary dilatation. Pancreas: Stable atrophic and fatty replaced pancreas. No evidence of pancreatic inflammation Spleen: Normal in size without focal abnormality. Adrenals/Urinary Tract: Adrenal glands are unremarkable. Kidneys are normal, without renal calculi, focal lesion, or hydronephrosis. Bladder is unremarkable. Stomach/Bowel: Moderate fecal material in the colon without evidence of bowel obstruction. No free air. No focal lesion identified. Vascular/Lymphatic: No significant vascular findings are present. No enlarged abdominal or pelvic lymph nodes. Reproductive: Status post hysterectomy. No adnexal masses. Other: No abdominal wall hernia or abnormality. No abdominopelvic ascites. Musculoskeletal: No acute findings. Stable mild loss of height of the L1 vertebral body anteriorly. IMPRESSION: No acute findings in the abdomen or pelvis. Stable atrophic and fatty replaced pancreas. Electronically Signed   By: GAletta EdouardM.D.   On: 11/15/2019 14:09   [place] Bilateral (at [facility] Only)  Result Date: 11/16/2019 CLINICAL DATA:  Stroke.  Hypertension EXAM: BILATERAL CAROTID DUPLEX ULTRASOUND TECHNIQUE: GPearline Cablesscale imaging, color Doppler and duplex ultrasound were performed of bilateral carotid and vertebral arteries in the neck. COMPARISON:  02/17/2015 by report only FINDINGS: Criteria: Quantification of carotid stenosis is based on velocity parameters that correlate the residual internal carotid diameter with NASCET-based stenosis levels, using the diameter of the distal internal carotid lumen as the denominator for stenosis measurement. The following velocity measurements were obtained: RIGHT ICA: 62/17 cm/sec CCA: 69/15 cm/sec  SYSTOLIC ICA/CCA RATIO:  0.9 ECA: 35 cm/sec LEFT ICA: 125/41 cm/sec (probably overestimated due to poor angle correction) CCA: 62/94 cm/sec  SYSTOLIC ICA/CCA RATIO:  2.0 ECA: 33 cm/sec RIGHT CAROTID ARTERY: Intimal thickening in the common carotid artery. Eccentric partially calcified plaque in the bulb without high-grade stenosis. Normal waveforms and color Doppler signal. RIGHT VERTEBRAL ARTERY:  Normal flow direction and waveform. LEFT CAROTID ARTERY: Mild intimal thickening. Mild eccentric plaque in the proximal ICA. No high-grade stenosis. Normal waveforms and color Doppler signal. LEFT VERTEBRAL ARTERY:  Normal flow direction and waveform. IMPRESSION: 1. Mild bilateral carotid plaque resulting in less than 50% diameter ICA stenosis. 2.  Antegrade bilateral vertebral arterial flow. Electronically Signed   By: Lucrezia Europe M.D.   On: 11/16/2019 07:18   Ct L-spine No Charge  Result Date: 11/15/2019 CLINICAL DATA:  Increasing weakness over the past 2 weeks. Multiple falls. Initial encounter. EXAM: CT LUMBAR SPINE WITHOUT CONTRAST TECHNIQUE: Multidetector CT imaging of the lumbar spine was performed without intravenous contrast administration. Multiplanar CT image reconstructions were also generated. COMPARISON:  MRI lumbar spine 07/24/2018. FINDINGS: Segmentation: Standard. Alignment: Maintained. Vertebrae: No acute abnormality. Remote L1 superior endplate compression fracture with vertebral body height loss of approximately 25% is unchanged. Paraspinal and other soft tissues: See report of dedicated CT abdomen and pelvis this same day. Disc levels: T11-12: Minimal retropulsion off the superior endplate of L1. No stenosis. L1-2: Mild disc bulge. No stenosis. L2-3: Negative. L3-4: There is loss of disc space height and vacuum disc phenomenon. Calcified right paracentral protrusion with cephalad extension is again seen. No stenosis. L4-5: Mild facet arthropathy. Otherwise negative. L5-S1: Bilateral facet degenerative change is worse on the left. No stenosis. IMPRESSION: 1. Negative for fracture or other acute abnormality. 2. Remote L1 superior  endplate compression fracture with vertebral body height loss of approximately 25%, unchanged. 3. Degenerative disease appearing worst at L3-4 where there is a calcified right paracentral protrusion with cephalad extension without central canal or foraminal stenosis. Electronically Signed   By: Inge Rise M.D.   On: 11/15/2019 13:59   Dg Ankle Right Port  Result Date: 11/15/2019 CLINICAL DATA:  Pain after fall EXAM: PORTABLE RIGHT ANKLE - 2 VIEW COMPARISON:  November 15, 2019 FINDINGS: There is a fracture through the medial malleolus without displacement. There is a suspected subtle fracture through the fibular metaphysis. IMPRESSION: 1. Medial malleolar fracture. 2. Suspected subtle fracture through the fibular metaphysis. Electronically Signed   By: Dorise Bullion III M.D   On: 11/15/2019 15:16   Ct Head Code Stroke Wo Contrast`  Addendum Date: 11/16/2019   ADDENDUM REPORT: 11/16/2019 01:40 ADDENDUM: Study discussed by telephone with NP Rufina Falco on 11/16/2019 at 0134 hours. Electronically Signed   By: Genevie Ann M.D.   On: 11/16/2019 01:40   Result Date: 11/16/2019 CLINICAL DATA:  Code stroke. 68 year old female with worsening mental status, now unresponsive. EXAM: CT HEAD WITHOUT CONTRAST TECHNIQUE: Contiguous axial images were obtained from the base of the skull through the vertex without intravenous contrast. COMPARISON:  Head CT and brain MRI yesterday. FINDINGS: Brain: No midline shift, ventriculomegaly, mass effect, evidence of mass lesion, intracranial hemorrhage or evidence of cortically based acute infarction. Stable and negative gray-white matter differentiation throughout the brain. Vascular: No suspicious intracranial vascular hyperdensity. Skull: Stable, negative. Sinuses/Orbits: Visualized paranasal sinuses and mastoids are stable and well pneumatized. Other: Visualized orbits and scalp soft tissues are within normal limits. ASPECTS [facility] Stroke Program Early CT Score) Total  score (  0-10 with 10 being normal): 10 IMPRESSION: 1. Stable and negative non-contrast CT appearance of the brain. 2. ASPECTS 10. Electronically Signed: By: Genevie Ann M.D. On: 11/16/2019 01:32    Scheduled Meds: . amitriptyline  10 mg Oral QHS  . aspirin EC  81 mg Oral Daily  . azithromycin  250 mg Oral Daily  . clonazePAM  0.5 mg Oral QHS  . divalproex  125 mg Oral BID  . donepezil  10 mg Oral QHS  . enoxaparin (LOVENOX) injection  40 mg Subcutaneous Q24H  . levothyroxine  50 mcg Oral QAC breakfast  . pantoprazole  40 mg Oral Daily   Continuous Infusions: . sodium chloride 50 mL/hr at 11/16/19 1833  . cefTRIAXone (ROCEPHIN)  IV Stopped (11/17/19 0335)    Assessment/Plan:  1. Acute metabolic encephalopathy with slurred speech.  Patient having falls and dropping things at home.  Stroke work-up including MRI of the brain negative.  Continue to hold gabapentin.  Decrease dose of amitriptyline.  Remerondiscontinued.  Decrease dose of clonazepam at night.  Treat infection.  Mental status better today. 2. Acute kidney injury on chronic kidney disease (probably stage III) but I think I can get creatinine even better if she is able to urinate and with IV fluid hydration.  Creatinine with slight improvement.  Patient with urinary retention today requiring in and out catheterization. 3. Relative hypotension on presentation with history of hypertension.  Blood pressure starting to increase.  Will restart low-dose Toprol-XL. 4. Acute cystitis without hematuria and possibility of pneumonia.  On Rocephin and Zithromax.  Urine culture still not resulted yet. 5. Right ankle fracture and proximal fibula fracture.  Tylenol as needed for pain.  Toradol as needed for pain.  Orthopedic consult appreciated and she is nonweightbearing on the right leg. 6. Hypothyroidism unspecified.  On levothyroxine. 7. Insomnia, anxiety depression.  Depakote level normal.  Decrease dose of amitriptyline and  Klonopin. 8. Alzheimer's dementia on Aricept.  Code Status:     Code Status Orders  (From admission, onward)         Start     Ordered   11/15/19 1534  Full code  Continuous     11/15/19 1536        Code Status History    This patient has a current code status but no historical code status.   Advance Care Planning Activity     Family Communication: Spoke with daughter-in-law who is the POA. Disposition Plan: Family did discuss whether or not rehab versus home with home health.  Consultants:  Orthopedic surgery  Neurology  Antibiotics:  Rocephin  Zithromax  Time spent: 27 minutes  [place]

## 2019-11-17 NOTE — Evaluation (Signed)
Physical Therapy Evaluation Patient Details Name: Valerie Hall MRN: [id number] DOB: 1951/01/28 Today's Date: 11/17/2019   History of Present Illness  Pt is a 68 y.o. female presenting to hospital 11/15/19 s/p multiple falls (with R knee pain) and increased weakness past 2 weeks.  Pt noted to be aphasic, slurred speech, and asymmetry of face; also c/o abdominal pain.  Pt admitted with acute metabolic encephalopathy with slurred speech, acute on chronic kidney disease, and PNA.  Imaging showing nondisplaced fx proximal fibula, medial malleolar fx, and suspected subtle fx through the fibular metaphsyis.  CT of head and MRI of brain negative for acute intracranial process.  Pt with unresponsive episode 11/30 early AM with fixed pupils and gaze.  L-spine imaging showing remote L1 superior endplate compression fx with vertebral body height loss about 25% (unchanged).  PMH includes depression, chronic pain, dementia, htn, Menieres disease, Crohn's diesase.  Clinical Impression  Prior to hospital admission, pt was ambulatory (recently using 4ww but nothing prior to that) and lives with family.  Currently pt is 1 assist with bed mobility.  Unable to stand pt with 1 assist, bed height elevated, and use of walker.  Able to perform lateral scoot 1/2 way to R (recliner armrest lowered) but pt became slow to respond and reporting feeling very dizzy so therapist assisted pt back to bed for safety; pt then reporting feeling like her heart was racing (HR noted to be 127 bpm--was 103 bpm at rest beginning of session) so therapist assisted pt with laying back down in bed.  Nurse notified immediately and came to assess pt.  Nurse present and O2 and BP WFL.  Symptoms improved with rest in bed.  Pt would benefit from skilled PT to address noted impairments and functional limitations (see below for any additional details).  Upon hospital discharge, pt would benefit from STR.    Follow Up Recommendations SNF    Equipment  Recommendations  Rolling walker with 5" wheels;3in1 (PT);Wheelchair (measurements PT);Wheelchair cushion (measurements PT)    Recommendations for Other Services OT consult     Precautions / Restrictions Precautions Precautions: Fall Precaution Comments: aspiration Required Braces or Orthoses: Other Brace Other Brace: R ankle in splint Restrictions Weight Bearing Restrictions: Yes RLE Weight Bearing: Non weight bearing      Mobility  Bed Mobility Overal bed mobility: Needs Assistance Bed Mobility: Supine to Sit;Sit to Supine     Supine to sit: Mod assist;HOB elevated Sit to supine: Mod assist;HOB elevated   General bed mobility comments: assist for trunk and B LE's semi-supine to/from sit; vc's for technique  Transfers Overall transfer level: Needs assistance Equipment used: Rolling walker (2 wheeled);None Transfers: Sit to/from Stand;Lateral/Scoot Transfers Sit to Stand: Total assist;From elevated surface        Lateral/Scoot Transfers: Mod assist General transfer comment: pt unable to stand up to RW with 1 assist and bed height elevated and max cueing for technique; able to perform lateral scoot to R towards chair (armrest lowered) about half way but d/t concerns pt assist back to bed  Ambulation/Gait             General Gait Details: unable to stand to attempt  Stairs            Wheelchair Mobility    Modified Rankin (Stroke Patients Only)       Balance Overall balance assessment: Needs assistance Sitting-balance support: No upper extremity supported;Feet unsupported Sitting balance-Leahy Scale: Good Sitting balance - Comments: steady sitting reaching within BOS  Standing balance comment: unable to stand to attempt                             Pertinent Vitals/Pain  6/10 R ankle pain    Home Living Family/patient expects to be discharged to:: Private residence Living Arrangements: Children;Other relatives(Pt's son and  son's family) Available Help at Discharge: Family Type of Home: House       Home Layout: Two level(stays on main level) Home Equipment: Grab bars - tub/shower;Shower seat;Walker - 4 wheels      Prior Function Level of Independence: Independent with assistive device(s)         Comments: Pt was recently starting to use 4ww d/t weakness but did not use any AD prior to that.     Hand Dominance        Extremity/Trunk Assessment   Upper Extremity Assessment Upper Extremity Assessment: Generalized weakness    Lower Extremity Assessment Lower Extremity Assessment: Generalized weakness;RLE deficits/detail RLE Deficits / Details: able to wiggle toes; at least 3/5 hip flexion AROM    Cervical / Trunk Assessment Cervical / Trunk Assessment: Normal  Communication   Communication: No difficulties  Cognition Arousal/Alertness: Awake/alert Behavior During Therapy: WFL for tasks assessed/performed                                   General Comments: Oriented to person and place but not situation or time      General Comments General comments (skin integrity, edema, etc.): R ankle splint in place.  Nursing cleared pt for participation in physical therapy.  Pt agreeable to PT session.  Pt's son present beginning of session but left during session.    Exercises  Transfer training   Assessment/Plan    PT Assessment Patient needs continued PT services  PT Problem List Decreased strength;Decreased activity tolerance;Decreased balance;Decreased mobility;Decreased knowledge of use of DME;Decreased knowledge of precautions;Pain       PT Treatment Interventions DME instruction;Gait training;Functional mobility training;Therapeutic activities;Therapeutic exercise;Balance training;Patient/family education    PT Goals (Current goals can be found in the Care Plan section)  Acute Rehab PT Goals Patient Stated Goal: to improve mobility PT Goal Formulation: With  patient Time For Goal Achievement: 12/01/19 Potential to Achieve Goals: Fair    Frequency 7X/week   Barriers to discharge Decreased caregiver support      Co-evaluation               AM-PAC PT "6 Clicks" Mobility  Outcome Measure Help needed turning from your back to your side while in a flat bed without using bedrails?: A Lot Help needed moving from lying on your back to sitting on the side of a flat bed without using bedrails?: A Lot Help needed moving to and from a bed to a chair (including a wheelchair)?: A Lot Help needed standing up from a chair using your arms (e.g., wheelchair or bedside chair)?: Total Help needed to walk in hospital room?: Total Help needed climbing 3-5 steps with a railing? : Total 6 Click Score: 9    End of Session Equipment Utilized During Treatment: Gait belt Activity Tolerance: Treatment limited secondary to medical complications (Comment) Patient left: in bed;with nursing/sitter in room(nursing present to assist pt with clean-up and nursing reporting they would set pt up with her needs when finished) Nurse Communication: Mobility status;Precautions;Other (comment);Weight bearing status(Pt's symptoms) PT Visit  Diagnosis: Other abnormalities of gait and mobility (R26.89);Muscle weakness (generalized) (M62.81);Repeated falls (R29.6);History of falling (Z91.81);Difficulty in walking, not elsewhere classified (R26.2);Pain Pain - Right/Left: Right Pain - part of body: Ankle and joints of foot    Time: 0180-9704 PT Time Calculation (min) (ACUTE ONLY): 37 min   Charges:   PT Evaluation $PT Eval Low Complexity: 1 Low PT Treatments $Therapeutic Activity: 23-37 mins        Leitha Bleak, PT 11/17/19, 4:56 PM

## 2019-11-17 NOTE — Progress Notes (Signed)
OT Cancellation Note  Patient Details Name: Valerie Hall MRN: [id number] DOB: 1951-09-13   Cancelled Treatment:    Reason Eval/Treat Not Completed: Other (comment)  Pt appears fatigued when OT presents for evaluation this date. OT attempts to motivate pt to participate in evaluation/assessment on some level, but pt politely declines stating "I'm just wiped out". Will continue to follow and attempt evaluation on next scheduled date as able.   Gerrianne Scale, [place], OTR/L ascom [phone] 11/17/19, 4:39 PM

## 2019-11-18 LAB — BASIC METABOLIC PANEL
Anion gap: 8 (ref 5–15)
BUN: 24 mg/dL — ABNORMAL HIGH (ref 8–23)
CO2: 21 mmol/L — ABNORMAL LOW (ref 22–32)
Calcium: 7.1 mg/dL — ABNORMAL LOW (ref 8.9–10.3)
Chloride: 111 mmol/L (ref 98–111)
Creatinine, Ser: 1.3 mg/dL — ABNORMAL HIGH (ref 0.44–1.00)
GFR calc Af Amer: 48 mL/min — ABNORMAL LOW (ref >60)
GFR calc non Af Amer: 42 mL/min — ABNORMAL LOW (ref >60)
Glucose, Bld: 102 mg/dL — ABNORMAL HIGH (ref 70–99)
Potassium: 3.7 mmol/L (ref 3.5–5.1)
Sodium: 140 mmol/L (ref 135–145)

## 2019-11-18 LAB — URINE CULTURE
Culture: >=100000 — AB
Special Requests: NORMAL

## 2019-11-18 LAB — TROPONIN I (HIGH SENSITIVITY)
Troponin I (High Sensitivity): 4 ng/L (ref <18)
Troponin I (High Sensitivity): 5 ng/L (ref <18)

## 2019-11-18 MED ORDER — LABETALOL HCL 5 MG/ML IV SOLN
10.0000 mg | INTRAVENOUS | Status: DC | PRN
  Administered 2019-11-18: 18:00:00 10 mg via INTRAVENOUS
  Filled 2019-11-18: qty 4

## 2019-11-18 MED ORDER — LORATADINE 10 MG PO TABS
10.0000 mg | ORAL_TABLET | Freq: Every day | ORAL | Status: DC
  Administered 2019-11-19 – 2019-11-25 (×7): 10 mg via ORAL
  Filled 2019-11-18 (×7): qty 1

## 2019-11-18 MED ORDER — LOPERAMIDE HCL 2 MG PO CAPS
2.0000 mg | ORAL_CAPSULE | ORAL | Status: DC | PRN
  Administered 2019-11-18: 13:00:00 2 mg via ORAL
  Filled 2019-11-18 (×2): qty 1

## 2019-11-18 MED ORDER — DIPHENHYDRAMINE HCL 25 MG PO CAPS
25.0000 mg | ORAL_CAPSULE | Freq: Every evening | ORAL | Status: DC | PRN
  Administered 2019-11-18: 13:00:00 25 mg via ORAL
  Filled 2019-11-18: qty 1

## 2019-11-18 MED ORDER — AMLODIPINE BESYLATE 5 MG PO TABS
5.0000 mg | ORAL_TABLET | Freq: Every day | ORAL | Status: DC
  Administered 2019-11-18 – 2019-11-23 (×6): 5 mg via ORAL
  Filled 2019-11-18 (×6): qty 1

## 2019-11-18 NOTE — Progress Notes (Signed)
PT Cancellation Note  Patient Details Name: Valerie Hall MRN: [id number] DOB: 08-03-1951   Cancelled Treatment:    Reason Eval/Treat Not Completed: Medical issues which prohibited therapy.  Chart reviewed.  Nurse reports pt having chest pain this morning.  Per discussion with pt's nurse, will hold PT at this time and re-attempt therapy at a later date/time as medically appropriate.  Leitha Bleak, PT 11/18/19, 9:20 AM

## 2019-11-18 NOTE — Progress Notes (Signed)
PROGRESS NOTE    Valerie Hall  DHR:[id number] DOB: [birth year] DOA: 11/15/2019  PCP: Idelle Crouch, MD    LOS - 3   Brief Narrative:   68 y.o. female with a known history of hypertension, Mnire's disease, dementia presents to the hospital with recurrent falls over previous week or more.  Patient reportedly also dropping objects, had been confused, per family, and then had some slurred speech and facial droop, which prompted coming to ED for evaluation.  In the ED, CT head negative.  CT of cervical spine showed possible RUL pneumonia.  Xray of RLE showed proximal fibula fracture.  Patient admitted for further evaluation and management.  Evaluation for possible stroke included MRI brain, carotid duplex and echocardiogram which were unrevealing.  Neurology consulted.  Suspect falls and apparent neurologic symptoms could be secondary to polypharmacy, medications reviewed and adjustments made including gabapentin held, decreased TCA dose, stopped Remeron and decreased Klonopin.  Ortho consulted for fibula fracture.  RLE to remain in split, non-weightbearing and cast to be placed as outpatient during follow up.     Subjective 12/2: This morning, RN reported patient complaining of chest pain/tightness.  ECG obtained showed sinus tachycardia, no acute ischemic changes.  When patient seen and examined, reported chest pain fully resolved, but she can press beside her sternum and it hurts.  Reports return of diarrhea, has chronic intermittent diarrhea.  Requests immodium which she takes at home.  States she has not slept.  Request her allergy medication.  No fevers/chills or other acute complaints.   Assessment & Plan:   Active Problems:   Slurred speech   Alzheimer's dementia without behavioral disturbance (HCC)   Acute metabolic encephalopathy -improved Slurred speech Recurrent falls -Evaluation for stroke including MRI brain negative -Continue to hold gabapentin -Continue reduced dose  of amitriptyline -Stop Remeron -Decreased home Klonopin -Treating for infection as below -PT OT  Acute cystitis without hematuria Possible pneumonia, based on imaging -Continue Rocephin and azithromycin for now -Follow-up urine culture  Right ankle and proximal fibula fractures -Tylenol and Toradol as needed for pain -Ortho consulted, recommend right lower extremity to remain in splint, nonweightbearing on the right lower extremity, cast to be placed in outpatient follow-up  Acute kidney injury superimposed on CKD stage IIIa Urinary retention -AKI improving with hydration -bladder scans -In and out cath as needed for urinary retention  Hypotension History of hypertension -BP meds initially held BP has improved -Resume Toprol-XL  Hypothyroidism -Continue home levothyroxine  Insomnia Anxiety/depression -Reduced doses of amitriptyline and Klonopin as above  Alzheimer's dementia, without behavioral disturbance -Continue home Aricept    DVT prophylaxis: Lovenox   Code Status: Full Code  Family Communication: None at bedside Disposition Plan: Likely to SNF for rehab   Consultants:   Orthopedics  Neurology    Antimicrobials:   Rocephin and azithromycin   Objective: Vitals:   11/18/19 0824 11/18/19 0826 11/18/19 1133 11/18/19 1155  BP:  (!) 160/93 (!) 156/62 (!) 157/71  Pulse: (!) 110 (!) 112 100 96  Resp:   18 20  Temp:  98.3 F (36.8 C) 98.9 F (37.2 C) 98.7 F (37.1 C)  TempSrc:  Oral Oral Oral  SpO2: 100% 99% 100% 99%  Weight:      Height:        Intake/Output Summary (Last 24 hours) at 11/18/2019 1226 Last data filed at 11/17/2019 1700 Gross per 24 hour  Intake 360 ml  Output -  Net 360 ml   [employer]  11/15/19 1108  Weight: 79.4 kg    Examination:  General exam: awake, alert, no acute distress Respiratory system: clear to auscultation bilaterally, no wheezes, rales or rhonchi, normal respiratory effort. Cardiovascular system:  normal S1/S2, RRR, no JVD, murmurs, rubs, gallops, no pedal edema.   Gastrointestinal system: soft, non-tender, non-distended abdomen, no organomegaly or masses felt, normal bowel sounds. Central nervous system: alert and oriented x3. no gross focal neurologic deficits, normal speech Extremities: moves all, no edema, normal tone Psychiatry: normal mood, congruent affect, judgement and insight appear normal    Data Reviewed: I have personally reviewed following labs and imaging studies  CBC: Recent Labs  Lab 11/15/19 1112 11/17/19 0518  WBC 16.0* 14.1*  NEUTROABS 13.7*  --   HGB 13.5 11.0*  HCT 40.7 35.1*  MCV 96.7 101.4*  PLT 244 756   Basic Metabolic Panel: Recent Labs  Lab 11/15/19 1112 11/16/19 0438 11/17/19 0518 11/18/19 0427  NA 140 138 139 140  K 4.1 3.4* 3.7 3.7  CL 107 107 112* 111  CO2 23 20* 19* 21*  GLUCOSE 111* 97 106* 102*  BUN 20 21 25* 24*  CREATININE 1.43* 1.41* 1.35* 1.30*  CALCIUM 8.5* 7.5* 7.3* 7.1*   GFR: Estimated Creatinine Clearance: 42.8 mL/min (A) (by C-G formula based on SCr of 1.3 mg/dL (H)). Liver Function Tests: Recent Labs  Lab 11/15/19 1112  AST 30  ALT 23  ALKPHOS 113  BILITOT 0.9  PROT 7.2  ALBUMIN 3.5   Recent Labs  Lab 11/15/19 1112  LIPASE 16   No results for input(s): AMMONIA in the last 168 hours. Coagulation Profile: No results for input(s): INR, PROTIME in the last 168 hours. Cardiac Enzymes: No results for input(s): CKTOTAL, CKMB, CKMBINDEX, TROPONINI in the last 168 hours. BNP (last 3 results) No results for input(s): PROBNP in the last 8760 hours. HbA1C: Recent Labs    11/15/19 1612  HGBA1C 6.1*   CBG: Recent Labs  Lab 11/16/19 0054  GLUCAP 136*   Lipid Profile: Recent Labs    11/16/19 0438  CHOL 114  HDL 44  LDLCALC 53  TRIG 85  CHOLHDL 2.6   Thyroid Function Tests: Recent Labs    11/16/19 0438  TSH 4.793*   Anemia Panel: No results for input(s): VITAMINB12, FOLATE, FERRITIN, TIBC,  IRON, RETICCTPCT in the last 72 hours. Sepsis Labs: Recent Labs  Lab 11/15/19 1612 11/16/19 0438 11/17/19 0518  PROCALCITON <0.10 0.10 0.13    Recent Results (from the past 240 hour(s))  SARS CORONAVIRUS 2 (TAT 6-24 HRS) Nasopharyngeal Nasopharyngeal Swab     Status: None   Collection Time: 11/15/19  3:21 PM   Specimen: Nasopharyngeal Swab  Result Value Ref Range Status   SARS Coronavirus 2 NEGATIVE NEGATIVE Final    Comment: (NOTE) SARS-CoV-2 target nucleic acids are NOT DETECTED. The SARS-CoV-2 RNA is generally detectable in upper and lower respiratory specimens during the acute phase of infection. Negative results do not preclude SARS-CoV-2 infection, do not rule out co-infections with other pathogens, and should not be used as the sole basis for treatment or other patient management decisions. Negative results must be combined with clinical observations, patient history, and epidemiological information. The expected result is Negative. Fact Sheet for Patients: SugarRoll.be Fact Sheet for Healthcare Providers: https://www.woods-mathews.com/ This test is not yet approved or cleared by the [place] FDA and  has been authorized for detection and/or diagnosis of SARS-CoV-2 by FDA under an Emergency Use Authorization (EUA). This EUA will remain  in effect (meaning this test can be used) for the duration of the COVID-19 declaration under Section 56 4(b)(1) of the Act, 21 U.S.C. section 360bbb-3(b)(1), unless the authorization is terminated or revoked sooner. Performed at [facility], [place] [address] [place], [place] [address]   Urine Culture     Status: Abnormal   Collection Time: 11/15/19  5:17 PM   Specimen: Urine, Clean Catch  Result Value Ref Range Status   Specimen Description   Final    URINE, CLEAN CATCH Performed at [facility], [address] [place], [place] [address]    Special Requests   Final     Normal Performed at [facility], [address] [place],  [address]    Culture >=100,000 COLONIES/mL STAPHYLOCOCCUS LUGDUNENSIS (A)  Final   Report Status 11/18/2019 FINAL  Final   Organism ID, Bacteria STAPHYLOCOCCUS LUGDUNENSIS (A)  Final      Susceptibility   Staphylococcus lugdunensis - MIC*    CIPROFLOXACIN <=0.5 SENSITIVE Sensitive     GENTAMICIN <=0.5 SENSITIVE Sensitive     NITROFURANTOIN <=16 SENSITIVE Sensitive     OXACILLIN >=4 RESISTANT Resistant     TETRACYCLINE <=1 SENSITIVE Sensitive     VANCOMYCIN <=0.5 SENSITIVE Sensitive     TRIMETH/SULFA <=10 SENSITIVE Sensitive     CLINDAMYCIN <=0.25 SENSITIVE Sensitive     RIFAMPIN <=0.5 SENSITIVE Sensitive     Inducible Clindamycin NEGATIVE Sensitive     * >=100,000 COLONIES/mL STAPHYLOCOCCUS LUGDUNENSIS         Radiology Studies: No results found.      Scheduled Meds: . amitriptyline  10 mg Oral QHS  . aspirin EC  81 mg Oral Daily  . azithromycin  250 mg Oral Daily  . clonazePAM  0.5 mg Oral QHS  . divalproex  125 mg Oral BID  . donepezil  10 mg Oral QHS  . enoxaparin (LOVENOX) injection  40 mg Subcutaneous Q24H  . levothyroxine  50 mcg Oral QAC breakfast  . loratadine  10 mg Oral Daily  . pantoprazole  40 mg Oral Daily   Continuous Infusions: . sodium chloride 50 mL/hr at 11/16/19 1833  . cefTRIAXone (ROCEPHIN)  IV 1 g (11/17/19 1839)     LOS: 3 days    Time spent: 30-35 minutes    Ezekiel Slocumb, DO [facility] Pager: [phone]  If 7PM-7AM, please contact night-coverage [email or web address] Password [facility] 11/18/2019, 12:26 PM

## 2019-11-18 NOTE — Evaluation (Signed)
Occupational Therapy Evaluation Patient Details Name: Valerie Hall MRN: [id number] DOB: April 14, 1951 Today's Date: 11/18/2019    History of Present Illness Pt is a 68 y.o. female presenting to hospital 11/15/19 s/p multiple falls (with R knee pain) and increased weakness past 2 weeks.  Pt noted to be aphasic, slurred speech, and asymmetry of face; also c/o abdominal pain.  Pt admitted with acute metabolic encephalopathy with slurred speech, acute on chronic kidney disease, and PNA.  Imaging showing nondisplaced fx proximal fibula, medial malleolar fx, and suspected subtle fx through the fibular metaphsyis.  CT of head and MRI of brain negative for acute intracranial process.  Pt with unresponsive episode 11/30 early AM with fixed pupils and gaze.  L-spine imaging showing remote L1 superior endplate compression fx with vertebral body height loss about 25% (unchanged).  PMH includes depression, chronic pain, dementia, htn, Menieres disease, Crohn's diesase.   Clinical Impression   Pt seen for OT evaluation this date. Prior to hospital admission, pt reports being Indep with ADLs/IADLs.  Pt lives in two story home with son's family, and lives on main level of home. Pt was just beginning to use 4WW d/t progressing weakness just prior to admit to acute setting.  Currently pt demonstrates impairments in fxl activity tolerance, strength and R ankle pain with NWB status currently requiring setup to MIN A with bed level UB ADLs and MAX A with LB ADLs. Pt would benefit from skilled OT to address noted impairments and functional limitations (see below for any additional details) in order to maximize safety and independence while minimizing falls risk and caregiver burden.  Upon hospital discharge, recommend pt discharge to SNF for pt safety and recovery of ADL safety and independence status while R LE heals.    Follow Up Recommendations  SNF;Supervision/Assistance - 24 hour    Equipment Recommendations  Other  (comment)(defer to next venue of care)    Recommendations for Other Services       Precautions / Restrictions Precautions Precautions: Fall Precaution Comments: aspiration Required Braces or Orthoses: Other Brace Other Brace: R ankle in splint Restrictions Weight Bearing Restrictions: Yes RLE Weight Bearing: Non weight bearing      Mobility Bed Mobility               General bed mobility comments: deferred-unable to assess transition sup<>sit or t/f's on OT eval. RN reports pt with c/o CP and to have EKG this date (with mobilization to restroom). In addition, OT takes VS noting resting HR of up to 113, SBP 160 while in bed.  Transfers                 General transfer comment: deferred    Balance       Sitting balance - Comments: deferred balance assessment on OT eval       Standing balance comment: deferred                           ADL either performed or assessed with clinical judgement   ADL Overall ADL's : Needs assistance/impaired Eating/Feeding: Set up;Bed level Eating/Feeding Details (indicate cue type and reason): HOB elevated Grooming: Wash/dry face;Oral care;Set up;Bed level Grooming Details (indicate cue type and reason): HOB elevated, sup<>sit not performed at this time as pt with c/o chest pain per nursing and high resting HR/BP. Upper Body Bathing: Minimal assistance;Bed level   Lower Body Bathing: Maximal assistance;Bed level Lower Body Bathing Details (indicate cue  type and reason): based on clinincal observation Upper Body Dressing : Minimal assistance;Bed level   Lower Body Dressing: Maximal assistance Lower Body Dressing Details (indicate cue type and reason): pt unable to tolerate attempting to dress R LE and limited ROM to reach L LE at bed level to participate in threading socks.   Toilet Transfer Details (indicate cue type and reason): deferred       Tub/Shower Transfer Details (indicate cue type and reason):  deferred   General ADL Comments: unable to assess t/f's on OT eval-per nursing, pt with c/o CP (RN reports this happened after getting to commode) and to have EKG this date. In addition, OT takes VS noting resting HR of up to 113 and SBP at 160.     Vision Patient Visual Report: No change from baseline Additional Comments: pt states she has blurry vision at baseline, tracks appropriately on assessment. Appropriate peripheral vision, no evidence of a visual field cut.     Perception     Praxis      Pertinent Vitals/Pain Pain Assessment: 0-10 Pain Score: 6  Pain Location: R ankle Pain Descriptors / Indicators: Aching;Sore Pain Intervention(s): Limited activity within patient's tolerance;Monitored during session     Hand Dominance     Extremity/Trunk Assessment Upper Extremity Assessment Upper Extremity Assessment: Generalized weakness;RUE deficits/detail;LUE deficits/detail RUE Deficits / Details: 4-/5 shoulder, elbow, grip, difficult to fuly assess for dysdiadokinesia d/t some difficulty following commands, but pt potentially moving R hand minimally slower than L hand LUE Deficits / Details: 4-/5 shoulder, elbow, grip   Lower Extremity Assessment Lower Extremity Assessment: Defer to PT evaluation;Generalized weakness;RLE deficits/detail RLE: Unable to fully assess due to pain   Cervical / Trunk Assessment Cervical / Trunk Assessment: Normal   Communication Communication Communication: No difficulties   Cognition Arousal/Alertness: Awake/alert Behavior During Therapy: WFL for tasks assessed/performed Overall Cognitive Status: Within Functional Limits for tasks assessed                                 General Comments: Pt has been intermittently confused over course of hospitalization, but is now able to name date, location, and situation on OT evaluation. Speech is also more clear versus when OT checked on pt in ED. Some increased processing time, and some  difficulty with higher level sequencing, but overall appropriate.   General Comments       Exercises Other Exercises Other Exercises: OT facilitates education with pt re: Role of OT including potential d/c recommendations. Pt verbalized understanding. Other Exercises: OT facilitates initiation of AE education for LB ADLs with verbal education at this time only as staff presents to perform EKG and pt unable to tolerate EOB sitting on OT eval. Would be beneficial to f/u next tx with visual demo and pt participation.   Shoulder Instructions      Home Living Family/patient expects to be discharged to:: Private residence Living Arrangements: Children;Other relatives(Pt's son and family) Available Help at Discharge: Family Type of Home: House       Home Layout: Two level;Able to live on main level with bedroom/bathroom     Bathroom Shower/Tub: Teacher, early years/pre: Standard     Home Equipment: Grab bars - tub/shower;Shower seat;Walker - 4 wheels          Prior Functioning/Environment Level of Independence: Independent with assistive device(s)        Comments: Pt only recently endorses use of  3XO secondary to progressive weakness (estimates x2 weeks), did not use any AD prior.        OT Problem List: Decreased strength;Decreased range of motion;Decreased activity tolerance;Impaired balance (sitting and/or standing);Decreased cognition;Decreased knowledge of use of DME or AE;Decreased knowledge of precautions;Pain      OT Treatment/Interventions: Self-care/ADL training;Therapeutic exercise;Energy conservation;DME and/or AE instruction;Therapeutic activities;Patient/family education;Balance training    OT Goals(Current goals can be found in the care plan section) Acute Rehab OT Goals Patient Stated Goal: to improve mobility OT Goal Formulation: With patient Time For Goal Achievement: 12/02/19 Potential to Achieve Goals: Good  OT Frequency: Min 2X/week    Barriers to D/C:            Co-evaluation              AM-PAC OT "6 Clicks" Daily Activity     Outcome Measure Help from another person eating meals?: None Help from another person taking care of personal grooming?: A Little Help from another person toileting, which includes using toliet, bedpan, or urinal?: A Lot Help from another person bathing (including washing, rinsing, drying)?: A Lot Help from another person to put on and taking off regular upper body clothing?: A Little Help from another person to put on and taking off regular lower body clothing?: A Lot 6 Click Score: 16   End of Session    Activity Tolerance: Treatment limited secondary to medical complications (Comment)(limited by c/o CP and incidence of slightly elevated resting HR.) Patient left:    OT Visit Diagnosis: Unsteadiness on feet (R26.81);History of falling (Z91.81);Muscle weakness (generalized) (M62.81)                Time: 3291-9166 OT Time Calculation (min): 23 min Charges:  OT General Charges $OT Visit: 1 Visit OT Evaluation $OT Eval Moderate Complexity: 1 Mod OT Treatments $Self Care/Home Management : 8-22 mins  Gerrianne Scale, MS, OTR/L ascom [phone] 11/18/19, 9:35 AM

## 2019-11-18 NOTE — Progress Notes (Signed)
Physical Therapy Treatment Patient Details Name: Valerie Hall MRN: [id number] DOB: 03/10/1951 Today's Date: 11/18/2019    History of Present Illness Pt is a 68 y.o. female presenting to hospital 11/15/19 s/p multiple falls (with R knee pain) and increased weakness past 2 weeks.  Pt noted to be aphasic, slurred speech, and asymmetry of face; also c/o abdominal pain.  Pt admitted with acute metabolic encephalopathy with slurred speech, acute on chronic kidney disease, and PNA.  Imaging showing nondisplaced fx proximal fibula, medial malleolar fx, and suspected subtle fx through the fibular metaphsyis.  CT of head and MRI of brain negative for acute intracranial process.  Pt with unresponsive episode 11/30 early AM with fixed pupils and gaze.  L-spine imaging showing remote L1 superior endplate compression fx with vertebral body height loss about 25% (unchanged).  PMH includes depression, chronic pain, dementia, htn, Menieres disease, Crohn's diesase.    PT Comments    Pt sitting up in recliner upon PT arrival (pt reports requiring 2 assist for transfer).  Nurse reports pt doing better and cleared pt for participation in physical therapy.  Pt unable to stand up to walker 1st attempt with assist but with continued verbal and tactile cues for technique pt able to stand up to walker 2x's with 1 assist (HR 105 bpm at rest but increased up to 125 bpm with activity requiring pacing and rest breaks to decrease HR; HR decreased back to 104 bpm at rest end of session).  Pt then reporting needing to use BSC.  NT called for 2nd assist but pt noted to be pale sitting in chair.  BP taken and noted to be 160/108 with HR 118 bpm.  NT notified nurse and nurse came to assess pt and BP then noted to be 172/86.  Pt reporting not needing to toilet anymore and pt assisted back to bed to rest with 1 assist.  Will continue to focus on strengthening and progressive functional mobility per pt tolerance.   Follow Up  Recommendations  SNF     Equipment Recommendations  Rolling walker with 5" wheels;3in1 (PT);Wheelchair (measurements PT);Wheelchair cushion (measurements PT)    Recommendations for Other Services OT consult     Precautions / Restrictions Precautions Precautions: Fall Precaution Comments: aspiration Required Braces or Orthoses: Other Brace Other Brace: R ankle in splint Restrictions Weight Bearing Restrictions: Yes RLE Weight Bearing: Non weight bearing    Mobility  Bed Mobility Overal bed mobility: Needs Assistance Bed Mobility: Sit to Supine       Sit to supine: Min assist;Mod assist   General bed mobility comments: assist for trunk and R>L LE; pt able to scoot up in bed on her own except requiring vc's for technique and NWB'ing R LE  Transfers Overall transfer level: Needs assistance Equipment used: Rolling walker (2 wheeled);None Transfers: Sit to/from [facility] Sit to Stand: Min assist;Mod assist Stand pivot transfers: Mod assist;Max assist       General transfer comment: pt unable to stand 1st trial with 1 assist but able to stand 2nd and 3rd trial with min assist, RW use, and vc's for UE/LE placement and overall technique; stand pivot to R recliner to bed with vc's for technique and 1 assist  Ambulation/Gait             General Gait Details: deferred d/t generalized weakness   Stairs             Wheelchair Mobility    Modified Rankin (Stroke Patients Only)  Balance Overall balance assessment: Needs assistance Sitting-balance support: No upper extremity supported;Feet unsupported Sitting balance-Leahy Scale: Good Sitting balance - Comments: steady sitting reaching within BOS       Standing balance comment: pt requiring heavy B UE support on walker for static standing balance                            Cognition Arousal/Alertness: Awake/alert Behavior During Therapy: WFL for tasks  assessed/performed Overall Cognitive Status: Within Functional Limits for tasks assessed                                        Exercises General Exercises - Lower Extremity Long Arc Quad: AROM;Strengthening;Left;10 reps;Seated Hip Flexion/Marching: AROM;Strengthening;Both;10 reps;Seated    General Comments General comments (skin integrity, edema, etc.): R LE ankle splint in place.  Nursing cleared pt for participation in physical therapy.  Pt agreeable to PT session.      Pertinent Vitals/Pain Pain Score: 7  Pain Location: R ankle Pain Descriptors / Indicators: Aching;Sore Pain Intervention(s): Limited activity within patient's tolerance;Monitored during session;Repositioned;Other (comment)(elevated on pillow end of session)     Home Living                      Prior Function            PT Goals (current goals can now be found in the care plan section) Acute Rehab PT Goals Patient Stated Goal: to improve mobility PT Goal Formulation: With patient Time For Goal Achievement: 12/01/19 Potential to Achieve Goals: Fair Progress towards PT goals: Progressing toward goals    Frequency    7X/week      PT Plan Current plan remains appropriate    Co-evaluation              AM-PAC PT "6 Clicks" Mobility   Outcome Measure  Help needed turning from your back to your side while in a flat bed without using bedrails?: A Little Help needed moving from lying on your back to sitting on the side of a flat bed without using bedrails?: A Lot Help needed moving to and from a bed to a chair (including a wheelchair)?: A Lot Help needed standing up from a chair using your arms (e.g., wheelchair or bedside chair)?: A Little Help needed to walk in hospital room?: Total Help needed climbing 3-5 steps with a railing? : Total 6 Click Score: 12    End of Session Equipment Utilized During Treatment: Gait belt Activity Tolerance: Patient limited by  fatigue Patient left: in bed;with call bell/phone within reach;with bed alarm set;Other (comment)(R LE elevated on pillow; bed in lowest position) Nurse Communication: Mobility status;Precautions;Weight bearing status;Other (comment)(pt's vitals) PT Visit Diagnosis: Other abnormalities of gait and mobility (R26.89);Muscle weakness (generalized) (M62.81);Repeated falls (R29.6);History of falling (Z91.81);Difficulty in walking, not elsewhere classified (R26.2);Pain Pain - Right/Left: Right Pain - part of body: Ankle and joints of foot     Time: 8937-3428 PT Time Calculation (min) (ACUTE ONLY): 38 min  Charges:  $Therapeutic Exercise: 8-22 mins $Therapeutic Activity: 23-37 mins                     Leitha Bleak, PT 11/18/19, 4:43 PM

## 2019-11-19 DIAGNOSIS — R338 Other retention of urine: Secondary | ICD-10-CM

## 2019-11-19 DIAGNOSIS — N179 Acute kidney failure, unspecified: Secondary | ICD-10-CM

## 2019-11-19 LAB — BASIC METABOLIC PANEL
Anion gap: 8 (ref 5–15)
BUN: 17 mg/dL (ref 8–23)
CO2: 20 mmol/L — ABNORMAL LOW (ref 22–32)
Calcium: 7.3 mg/dL — ABNORMAL LOW (ref 8.9–10.3)
Chloride: 115 mmol/L — ABNORMAL HIGH (ref 98–111)
Creatinine, Ser: 0.87 mg/dL (ref 0.44–1.00)
GFR calc Af Amer: >60 mL/min (ref >60)
GFR calc non Af Amer: >60 mL/min (ref >60)
Glucose, Bld: 99 mg/dL (ref 70–99)
Potassium: 4.2 mmol/L (ref 3.5–5.1)
Sodium: 143 mmol/L (ref 135–145)

## 2019-11-19 LAB — MAGNESIUM: Magnesium: 2.3 mg/dL (ref 1.7–2.4)

## 2019-11-19 LAB — CBC
HCT: 31.9 % — ABNORMAL LOW (ref 36.0–46.0)
Hemoglobin: 10.1 g/dL — ABNORMAL LOW (ref 12.0–15.0)
MCH: 31.6 pg (ref 26.0–34.0)
MCHC: 31.7 g/dL (ref 30.0–36.0)
MCV: 99.7 fL (ref 80.0–100.0)
Platelets: 185 10*3/uL (ref 150–400)
RBC: 3.2 MIL/uL — ABNORMAL LOW (ref 3.87–5.11)
RDW: 15.2 % (ref 11.5–15.5)
WBC: 7.8 10*3/uL (ref 4.0–10.5)
nRBC: 0 % (ref 0.0–0.2)

## 2019-11-19 MED ORDER — PSYLLIUM 95 % PO PACK
1.0000 | PACK | Freq: Every day | ORAL | Status: DC
  Administered 2019-11-19 – 2019-11-20 (×2): 1 via ORAL
  Filled 2019-11-19 (×7): qty 1

## 2019-11-19 MED ORDER — DOXYCYCLINE HYCLATE 100 MG PO TABS
100.0000 mg | ORAL_TABLET | Freq: Two times a day (BID) | ORAL | Status: AC
  Administered 2019-11-19 – 2019-11-23 (×10): 100 mg via ORAL
  Filled 2019-11-19 (×10): qty 1

## 2019-11-19 MED ORDER — TRAMADOL HCL 50 MG PO TABS
50.0000 mg | ORAL_TABLET | Freq: Four times a day (QID) | ORAL | Status: DC | PRN
  Administered 2019-11-19 – 2019-11-25 (×7): 50 mg via ORAL
  Filled 2019-11-19 (×7): qty 1

## 2019-11-19 MED ORDER — METOPROLOL SUCCINATE ER 50 MG PO TB24
50.0000 mg | ORAL_TABLET | Freq: Every day | ORAL | Status: DC
  Administered 2019-11-19 – 2019-11-25 (×7): 50 mg via ORAL
  Filled 2019-11-19 (×8): qty 1

## 2019-11-19 NOTE — Progress Notes (Signed)
SLP Cancellation Note  Patient Details Name: Valerie Hall MRN: [id number] DOB: 1951/09/13   Cancelled treatment:       Reason Eval/Treat Not Completed: (chart reviewed; consulted NSG and met w/ pt this am).  Pt is much more alert and talkative since initial meeting in ED. Pt is verbally conversive and makes wants/needs known verbally to her Nurse and MD -- she indicated pain of her R ankle to the Ortho MD during this visit and described issues w/ "diarrhea" to this SLP which were passed along the the Nurse.  Pt's speech is intelligible though she is missing Upper Dentition baseline and does not wear a denture plate. She communicates at conversational level but is often easily distracted in thoughts/conversation -- pt has a Baseline of Dementia (suspect she may be at/close to her baseline w/ her communication presentation). Pt denied any "new" problems when talking w/ others. Recommend no communication assessment at this time but IF new issues are suspected when she goes to SNF, then f/u can be had there in a more structured setting. Nursing updated and agreed. Recommend reducing distractions in environment during conversation.  NSG to reconsult if any decline in status occurs while admitted.       Orinda Kenner, MS, CCC-SLP Watson,Katherine 11/19/2019, 3:16 PM
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
## 2019-11-19 NOTE — Progress Notes (Addendum)
PROGRESS NOTE    Valerie Hall  OVF:[id number] DOB: [birth year] DOA: 11/15/2019  PCP: Idelle Crouch, MD    LOS - 4   Brief Narrative:  68 y.o.femalewith a known history ofhypertension, Mnire's disease, dementia presents to the hospital with recurrent falls over previous week or more.  Patient reportedly also dropping objects, had been confused, per family, and then had some slurred speech and facial droop, which prompted coming to ED for evaluation.  In the ED, CT head negative.  CT of cervical spine showed possible RUL pneumonia.  Xray of RLE showed proximal fibula fracture.  Patient admitted for further evaluation and management.  Evaluation for possible stroke included MRI brain, carotid duplex and echocardiogram which were unrevealing.  Neurology consulted.  Suspect falls and apparent neurologic symptoms could be secondary to polypharmacy, medications reviewed and adjustments made including gabapentin held, decreased TCA dose, stopped Remeron and decreased Klonopin.  Ortho consulted for fibula fracture.  RLE to remain in split, non-weightbearing and cast to be placed as outpatient during follow up.     Subjective 12/3: Patient awake, sitting up in bed.  Reports still having some diarrhea despite starting Immodium yesterday.  States she takes more when she uses it at home.  States she declined to work with PT earlier this AM.  Encouraged her to work with them when they return.  No acute events reported overnight.   Assessment & Plan:   Active Problems:   Hypothyroidism   Slurred speech   Acute metabolic encephalopathy   Hypotension   Acute cystitis without hematuria   Closed right ankle fracture, with routine healing, subsequent encounter   Alzheimer's dementia without behavioral disturbance (HCC)   AKI (acute kidney injury) ([place])   Acute urinary retention  Acute metabolic encephalopathy -improved Slurred speech Recurrent falls -Evaluation for stroke including MRI  brain negative -Continue to hold gabapentin -Continue reduced dose of amitriptyline -Stop Remeron -Decreased home Klonopin -Treating for infection as below -PT OT   Acute cystitis without hematuria Possible pneumonia, based on imaging -urine culture grew staph lugdunensis -transition to PO antibiotic - doxycycline to finish course  Right ankle and proximal fibula fractures -Tylenol and Toradol as needed for pain -will add Ultram today for better pain control -Ortho consulted, Dr. Elyse Jarvis, recommend right lower extremity to remain in splint, nonweightbearing on the right lower extremity, cast to be placed in outpatient follow-up, 7-10 days after discharge  Acute kidney injury superimposed on CKD stage IIIa Urinary retention -AKI improving with hydration -bladder scans -In and out cath as needed for urinary retention  Hypotension History of hypertension -BP meds initially held BP has improved -Resume Toprol-XL  Hypothyroidism -Continue home levothyroxine  Insomnia Anxiety/depression -Reduced doses of amitriptyline and Klonopin as above  Alzheimer's dementia, without behavioral disturbance -Continue home Aricept    DVT prophylaxis: Lovenox   Code Status: Full Code  Family Communication: None at bedside Disposition Plan: Likely to SNF for rehab, pending PT recommendations   Consultants:   Orthopedics  Neurology   Antimicrobials:   Rocephin and azithromycin - stop 12/3  Doxycycline - start 12/3, planned stop 12/7   Objective: Vitals:   11/18/19 1500 11/18/19 1938 11/19/19 0120 11/19/19 0742  BP: (!) 178/83 (!) 157/72 (!) 160/82 (!) 160/68  Pulse: (!) 108 86 93 92  Resp:  16 20 18   Temp: 98.8 F (37.1 C) 99 F (37.2 C) 98.6 F (37 C) 98.6 F (37 C)  TempSrc: Oral Oral Oral Oral  SpO2: 100% 99%  96% 97%  Weight:      Height:        Intake/Output Summary (Last 24 hours) at 11/19/2019 1253 Last data filed at 11/19/2019 0949 Gross  per 24 hour  Intake 120 ml  Output 423 ml  Net -303 ml   Filed Weights   11/15/19 1108  Weight: 79.4 kg    Examination:  General exam: awake, alert, no acute distress HEENT: moist mucus membranes, hearing grossly normal, edentulous Respiratory system: clear to auscultation bilaterally, no wheezes, rales or rhonchi, normal respiratory effort. Cardiovascular system: normal S1/S2, RRR, no JVD, murmurs, rubs, gallops Gastrointestinal system: soft, non-tender, non-distended abdomen Central nervous system: alert and oriented x4. no gross focal neurologic deficits, normal speech Extremities: moves all, right lower extremity dressing intact and foot elevated Skin: dry, intact, normal temperature Psychiatry: normal mood, congruent affect, judgement and insight appear normal    Data Reviewed: I have personally reviewed following labs and imaging studies  CBC: Recent Labs  Lab 11/15/19 1112 11/17/19 0518 11/19/19 0349  WBC 16.0* 14.1* 7.8  NEUTROABS 13.7*  --   --   HGB 13.5 11.0* 10.1*  HCT 40.7 35.1* 31.9*  MCV 96.7 101.4* 99.7  PLT 244 166 235   Basic Metabolic Panel: Recent Labs  Lab 11/15/19 1112 11/16/19 0438 11/17/19 0518 11/18/19 0427 11/19/19 0349  NA 140 138 139 140 143  K 4.1 3.4* 3.7 3.7 4.2  CL 107 107 112* 111 115*  CO2 23 20* 19* 21* 20*  GLUCOSE 111* 97 106* 102* 99  BUN 20 21 25* 24* 17  CREATININE 1.43* 1.41* 1.35* 1.30* 0.87  CALCIUM 8.5* 7.5* 7.3* 7.1* 7.3*  MG  --   --   --   --  2.3   GFR: Estimated Creatinine Clearance: 63.9 mL/min (by C-G formula based on SCr of 0.87 mg/dL). Liver Function Tests: Recent Labs  Lab 11/15/19 1112  AST 30  ALT 23  ALKPHOS 113  BILITOT 0.9  PROT 7.2  ALBUMIN 3.5   Recent Labs  Lab 11/15/19 1112  LIPASE 16   No results for input(s): AMMONIA in the last 168 hours. Coagulation Profile: No results for input(s): INR, PROTIME in the last 168 hours. Cardiac Enzymes: No results for input(s): CKTOTAL,  CKMB, CKMBINDEX, TROPONINI in the last 168 hours. BNP (last 3 results) No results for input(s): PROBNP in the last 8760 hours. HbA1C: No results for input(s): HGBA1C in the last 72 hours. CBG: Recent Labs  Lab 11/16/19 0054  GLUCAP 136*   Lipid Profile: No results for input(s): CHOL, HDL, LDLCALC, TRIG, CHOLHDL, LDLDIRECT in the last 72 hours. Thyroid Function Tests: No results for input(s): TSH, T4TOTAL, FREET4, T3FREE, THYROIDAB in the last 72 hours. Anemia Panel: No results for input(s): VITAMINB12, FOLATE, FERRITIN, TIBC, IRON, RETICCTPCT in the last 72 hours. Sepsis Labs: Recent Labs  Lab 11/15/19 1612 11/16/19 0438 11/17/19 0518  PROCALCITON <0.10 0.10 0.13    Recent Results (from the past 240 hour(s))  SARS CORONAVIRUS 2 (TAT 6-24 HRS) Nasopharyngeal Nasopharyngeal Swab     Status: None   Collection Time: 11/15/19  3:21 PM   Specimen: Nasopharyngeal Swab  Result Value Ref Range Status   SARS Coronavirus 2 NEGATIVE NEGATIVE Final    Comment: (NOTE) SARS-CoV-2 target nucleic acids are NOT DETECTED. The SARS-CoV-2 RNA is generally detectable in upper and lower respiratory specimens during the acute phase of infection. Negative results do not preclude SARS-CoV-2 infection, do not rule out co-infections with other pathogens, and  should not be used as the sole basis for treatment or other patient management decisions. Negative results must be combined with clinical observations, patient history, and epidemiological information. The expected result is Negative. Fact Sheet for Patients: SugarRoll.be Fact Sheet for Healthcare Providers: https://www.woods-mathews.com/ This test is not yet approved or cleared by the [place] FDA and  has been authorized for detection and/or diagnosis of SARS-CoV-2 by FDA under an Emergency Use Authorization (EUA). This EUA will remain  in effect (meaning this test can be used) for the duration  of the COVID-19 declaration under Section 56 4(b)(1) of the Act, 21 U.S.C. section 360bbb-3(b)(1), unless the authorization is terminated or revoked sooner. Performed at [facility], [place] [address], [place], [place] [address]   Urine Culture     Status: Abnormal   Collection Time: 11/15/19  5:17 PM   Specimen: Urine, Clean Catch  Result Value Ref Range Status   Specimen Description   Final    URINE, CLEAN CATCH Performed at [facility], [address] [place], [place] [address]    Special Requests   Final    Normal Performed at [facility], [address] [place], [place] [address]    Culture >=100,000 COLONIES/mL STAPHYLOCOCCUS LUGDUNENSIS (A)  Final   Report Status 11/18/2019 FINAL  Final   Organism ID, Bacteria STAPHYLOCOCCUS LUGDUNENSIS (A)  Final      Susceptibility   Staphylococcus lugdunensis - MIC*    CIPROFLOXACIN <=0.5 SENSITIVE Sensitive     GENTAMICIN <=0.5 SENSITIVE Sensitive     NITROFURANTOIN <=16 SENSITIVE Sensitive     OXACILLIN >=4 RESISTANT Resistant     TETRACYCLINE <=1 SENSITIVE Sensitive     VANCOMYCIN <=0.5 SENSITIVE Sensitive     TRIMETH/SULFA <=10 SENSITIVE Sensitive     CLINDAMYCIN <=0.25 SENSITIVE Sensitive     RIFAMPIN <=0.5 SENSITIVE Sensitive     Inducible Clindamycin NEGATIVE Sensitive     * >=100,000 COLONIES/mL STAPHYLOCOCCUS LUGDUNENSIS         Radiology Studies: No results found.      Scheduled Meds: . amitriptyline  10 mg Oral QHS  . amLODipine  5 mg Oral Daily  . aspirin EC  81 mg Oral Daily  . clonazePAM  0.5 mg Oral QHS  . divalproex  125 mg Oral BID  . donepezil  10 mg Oral QHS  . doxycycline  100 mg Oral BID  . enoxaparin (LOVENOX) injection  40 mg Subcutaneous Q24H  . levothyroxine  50 mcg Oral QAC breakfast  . loratadine  10 mg Oral Daily  . metoprolol succinate  50 mg Oral Daily  . pantoprazole  40 mg Oral Daily  . psyllium  1 packet Oral Daily   Continuous Infusions: . sodium  chloride 50 mL/hr at 11/16/19 1833     LOS: 4 days    Time spent: 30-35 minutes    Ezekiel Slocumb, DO [facility] Pager: [phone]  If 7PM-7AM, please contact night-coverage [email or web address] Password [email or web address] 11/19/2019, 12:53 PM

## 2019-11-19 NOTE — Progress Notes (Signed)
Physical Therapy Treatment Patient Details Name: Valerie Hall MRN: [id number] DOB: 11-25-1951 Today's Date: 11/19/2019    History of Present Illness Pt is a 67 y.o. female presenting to hospital 11/15/19 s/p multiple falls (with R knee pain) and increased weakness past 2 weeks.  Pt noted to be aphasic, slurred speech, and asymmetry of face; also c/o abdominal pain.  Pt admitted with acute metabolic encephalopathy with slurred speech, acute on chronic kidney disease, and PNA.  Imaging showing nondisplaced fx proximal fibula, medial malleolar fx, and suspected subtle fx through the fibular metaphsyis.  CT of head and MRI of brain negative for acute intracranial process.  Pt with unresponsive episode 11/30 early AM with fixed pupils and gaze.  L-spine imaging showing remote L1 superior endplate compression fx with vertebral body height loss about 25% (unchanged).  PMH includes depression, chronic pain, dementia, htn, Menieres disease, Crohn's diesase.    PT Comments    Ready for session.  To edge of bed with min a x 1 - reaches to pull up on my hand but overall does well with good effort.  Sitting unsupported edge of bed without difficulty.  She stands to walker x 2 minutes and noticed BM on pad.  +2 assist called for safety and she was able to stand again for care and transfer to recliner at bedside with RW and min a x 2.  She is able to maintain WB status well.   Follow Up Recommendations  SNF     Equipment Recommendations  Rolling walker with 5" wheels;3in1 (PT);Wheelchair (measurements PT);Wheelchair cushion (measurements PT)    Recommendations for Other Services       Precautions / Restrictions Precautions Precautions: Fall Other Brace: R ankle in splint Restrictions Weight Bearing Restrictions: Yes RLE Weight Bearing: Non weight bearing    Mobility  Bed Mobility Overal bed mobility: Needs Assistance Bed Mobility: Sit to Supine     Supine to sit: Min assist         Transfers Overall transfer level: Needs assistance Equipment used: Rolling walker (2 wheeled);None Transfers: Sit to/from [employer] to Stand: Min assist            Ambulation/Gait Ambulation/Gait assistance: Min assist;+2 physical assistance Gait Distance (Feet): 3 Feet Assistive device: Rolling walker (2 wheeled)   Gait velocity: decreased   General Gait Details: hop-to does well with NWB LLE this session   Stairs             Wheelchair Mobility    Modified Rankin (Stroke Patients Only)       Balance Overall balance assessment: Needs assistance Sitting-balance support: No upper extremity supported;Feet unsupported Sitting balance-Leahy Scale: Good     Standing balance support: Bilateral upper extremity supported Standing balance-Leahy Scale: Fair Standing balance comment: heavy reliance on walker but overall does well +2 for safety with transfer                            Cognition Arousal/Alertness: Awake/alert Behavior During Therapy: WFL for tasks assessed/performed Overall Cognitive Status: Within Functional Limits for tasks assessed                                        Exercises      General Comments        Pertinent Vitals/Pain Pain Assessment: Faces Faces Pain Scale: Hurts  a little bit Pain Location: R ankle Pain Descriptors / Indicators: Aching;Sore Pain Intervention(s): Limited activity within patient's tolerance;Monitored during session    Home Living                      Prior Function            PT Goals (current goals can now be found in the care plan section) Progress towards PT goals: Progressing toward goals    Frequency    7X/week      PT Plan Current plan remains appropriate    Co-evaluation              AM-PAC PT "6 Clicks" Mobility   Outcome Measure  Help needed turning from your back to your side while in a flat bed without using  bedrails?: A Little Help needed moving from lying on your back to sitting on the side of a flat bed without using bedrails?: A Little Help needed moving to and from a bed to a chair (including a wheelchair)?: A Lot Help needed standing up from a chair using your arms (e.g., wheelchair or bedside chair)?: A Little Help needed to walk in hospital room?: A Lot Help needed climbing 3-5 steps with a railing? : Total 6 Click Score: 14    End of Session Equipment Utilized During Treatment: Gait belt Activity Tolerance: Patient tolerated treatment well Patient left: in chair;with call bell/phone within reach;with chair alarm set Nurse Communication: Mobility status Pain - Right/Left: Right Pain - part of body: Ankle and joints of foot     Time: 1140-1155 PT Time Calculation (min) (ACUTE ONLY): 15 min  Charges:  $Therapeutic Activity: 8-22 mins                     Chesley Noon, PTA 11/19/19, 1:34 PM

## 2019-11-19 NOTE — Progress Notes (Signed)
  Subjective:  Patient complaining of mild pain to the right ankle.  Objective:   VITALS:   Vitals:   11/18/19 1500 11/18/19 1938 11/19/19 0120 11/19/19 0742  BP: (!) 178/83 (!) 157/72 (!) 160/82 (!) 160/68  Pulse: (!) 108 86 93 92  Resp:  16 20 18   Temp: 98.8 F (37.1 C) 99 F (37.2 C) 98.6 F (37 C) 98.6 F (37 C)  TempSrc: Oral Oral Oral Oral  SpO2: 100% 99% 96% 97%  Weight:      Height:        PHYSICAL EXAM: Right lower extremity: Splint remains in place and clean dry and intact. Neurovascular intact Sensation intact distally Intact pulses distally Compartment soft  LABS  Results for orders placed or performed during the hospital encounter of 11/15/19 (from the past 24 hour(s))  CBC     Status: Abnormal   Collection Time: 11/19/19  3:49 AM  Result Value Ref Range   WBC 7.8 4.0 - 10.5 K/uL   RBC 3.20 (L) 3.87 - 5.11 MIL/uL   Hemoglobin 10.1 (L) 12.0 - 15.0 g/dL   HCT 31.9 (L) 36.0 - 46.0 %   MCV 99.7 80.0 - 100.0 fL   MCH 31.6 26.0 - 34.0 pg   MCHC 31.7 30.0 - 36.0 g/dL   RDW 15.2 11.5 - 15.5 %   Platelets 185 150 - 400 K/uL   nRBC 0.0 0.0 - 0.2 %  Basic metabolic panel     Status: Abnormal   Collection Time: 11/19/19  3:49 AM  Result Value Ref Range   Sodium 143 135 - 145 mmol/L   Potassium 4.2 3.5 - 5.1 mmol/L   Chloride 115 (H) 98 - 111 mmol/L   CO2 20 (L) 22 - 32 mmol/L   Glucose, Bld 99 70 - 99 mg/dL   BUN 17 8 - 23 mg/dL   Creatinine, Ser 0.87 0.44 - 1.00 mg/dL   Calcium 7.3 (L) 8.9 - 10.3 mg/dL   GFR calc non Af Amer >60 >60 mL/min   GFR calc Af Amer >60 >60 mL/min   Anion gap 8 5 - 15  Magnesium     Status: None   Collection Time: 11/19/19  3:49 AM  Result Value Ref Range   Magnesium 2.3 1.7 - 2.4 mg/dL    No results found.  Assessment/Plan:     Active Problems:   Slurred speech   Alzheimer's dementia without behavioral disturbance (HCC)  Continue splinting of the right lower extremity.  Patient is nonweightbearing on the right  lower extremity.  Patient should follow-up in my office in approximately 7 to 10 days after discharge for reevaluation, x-ray and testing of the right bimalleolar ankle fracture.    Thornton Park , MD 11/19/2019, 10:47 AM

## 2019-11-20 MED ORDER — ONDANSETRON HCL 4 MG/2ML IJ SOLN
4.0000 mg | Freq: Four times a day (QID) | INTRAMUSCULAR | Status: DC | PRN
  Administered 2019-11-20 – 2019-11-22 (×4): 4 mg via INTRAVENOUS
  Filled 2019-11-20 (×4): qty 2

## 2019-11-20 MED ORDER — CLONIDINE HCL 0.1 MG PO TABS
0.2000 mg | ORAL_TABLET | Freq: Three times a day (TID) | ORAL | Status: DC
  Administered 2019-11-20 – 2019-11-25 (×17): 0.2 mg via ORAL
  Filled 2019-11-20 (×17): qty 2

## 2019-11-20 NOTE — Care Management Important Message (Signed)
Important Message  Patient Details  Name: Valerie Hall MRN: [id number] Date of Birth: 1951-02-22   Medicare Important Message Given:  Yes     Juliann Pulse A Dashawn Golda 11/20/2019, 11:23 AM

## 2019-11-20 NOTE — Progress Notes (Addendum)
PROGRESS NOTE    Valerie Hall  XBM:[id number] DOB: 28-Feb-1951 DOA: 11/15/2019  PCP: Idelle Crouch, MD    LOS - 5   Brief Narrative:  68 y.o.femalewith a known history ofhypertension, Mnire's disease, dementia presents to the hospital withrecurrentfallsover previous week or more.Patient reportedly also dropping objects, had been confused, per family, and then had some slurred speech and facial droop, which prompted coming to ED for evaluation. In the ED, CT head negative. CT of cervical spine showed possible RUL pneumonia. Xray of RLE showed proximal fibula fracture. Patient admitted for further evaluation and management. Evaluation for possible stroke included MRI brain, carotid duplex and echocardiogram which were unrevealing. Neurology consulted. Suspect falls and apparent neurologic symptoms could be secondary to polypharmacy, medications reviewed and adjustments made including gabapentin held, decreased TCA dose, stopped Remeron and decreased Klonopin. Ortho consulted for fibula fracture. RLE to remain in split, non-weightbearing on RLE and follow up with Dr. Mack Guise 1 week after discharge.   Subjective 12/4: Patient awake laying flat in bed when seen this AM.  She reports metamucil helped her diarrhea somewhat, becoming more formed.  Is having some nausea this AM but no vomiting.  Overall states no acute complaints, but feels generally weak.   Assessment & Plan:   Active Problems:   Hypothyroidism   Slurred speech   Acute metabolic encephalopathy   Hypotension   Acute cystitis without hematuria   Closed right ankle fracture, with routine healing, subsequent encounter   Alzheimer's dementia without behavioral disturbance (HCC)   AKI (acute kidney injury) ([place])   Acute urinary retention   Acute metabolic encephalopathy-improved Slurred speech Recurrent falls -Evaluation for stroke including MRI brain negative -Continue to hold gabapentin -Continue  reduced dose of amitriptyline -Stop Remeron -Decreased home Klonopin -Treating for infection as below -PT OT  -to SNF for rehab, social work on board  Acute cystitis without hematuria Possible pneumonia, based on imaging -urine culture grew staph lugdunensis, oxacillin resistant, otherwise pan-sensitive -continue doxycycline, to finish course 12/7  Right ankle and proximal fibula fractures -Tylenol and Toradol as needed for pain -will add Ultram today for better pain control -Ortho consulted, Dr. Theone Murdoch right lower extremity to remain in splint, nonweightbearing on the right lower extremity, follow up in clinic 7-10 after discharge.  Acute kidney injury superimposed on CKD stage IIIa Urinary retention -AKI improving with hydration -bladder scans -In and out cath as needed for urinary retention  Hypotension History of hypertension -BP meds initially held BP has improved and hypertensive again -Resume Toprol-XL 50 mg daily -Resume clonidine 0.2 mg TID -Added Norvasc 5 mg daily -labetalol IV PRN  Hypothyroidism -Continue home levothyroxine  Insomnia Anxiety/depression -Reduced doses of amitriptyline and Klonopin as above  Alzheimer's dementia,without behavioral disturbance -Continue home Aricept    DVT prophylaxis:Lovenox Code Status: Full Code Family Communication:None at bedside Disposition Plan:Likely to SNF for rehab, pending PT recommendations   Consultants:  Orthopedics  Neurology   Antimicrobials:  Rocephin and azithromycin - stop 12/3  Doxycycline - start 12/3, planned stop 12/7   Objective: Vitals:   11/19/19 0120 11/19/19 0742 11/19/19 1529 11/19/19 2307  BP: (!) 160/82 (!) 160/68 (!) 158/65 (!) 169/92  Pulse: 93 92 82 87  Resp: 20 18 18 17   Temp: 98.6 F (37 C) 98.6 F (37 C) 98.5 F (36.9 C) 98.2 F (36.8 C)  TempSrc: Oral Oral Oral   SpO2: 96% 97% 98% 96%  Weight:      Height:  Intake/Output Summary (Last 24 hours) at 11/20/2019 0731 Last data filed at 11/20/2019 0600 Gross per 24 hour  Intake 3317.63 ml  Output -  Net 3317.63 ml   Filed Weights   11/15/19 1108  Weight: 79.4 kg    Examination:  General exam: awake, alert, no acute distress HEENT: moist mucus membranes, hearing grossly normal Respiratory system: clear to auscultation bilaterally, no wheezes, rales or rhonchi, normal respiratory effort. Cardiovascular system: normal S1/S2, RRR, no JVD, murmurs, rubs, gallops, no pedal edema.   Gastrointestinal system: soft, non-tender, non-distended abdomen, normal bowel sounds. Central nervous system: alert and oriented x4. no gross focal neurologic deficits, normal speech Extremities: right lower extremity dressed and distal sensation intact, no edema, no cyanosis Skin: dry, intact, normal temperature Psychiatry: normal mood, congruent affect, judgement and insight appear normal    Data Reviewed: I have personally reviewed following labs and imaging studies  CBC: Recent Labs  Lab 11/15/19 1112 11/17/19 0518 11/19/19 0349  WBC 16.0* 14.1* 7.8  NEUTROABS 13.7*  --   --   HGB 13.5 11.0* 10.1*  HCT 40.7 35.1* 31.9*  MCV 96.7 101.4* 99.7  PLT 244 166 782   Basic Metabolic Panel: Recent Labs  Lab 11/15/19 1112 11/16/19 0438 11/17/19 0518 11/18/19 0427 11/19/19 0349  NA 140 138 139 140 143  K 4.1 3.4* 3.7 3.7 4.2  CL 107 107 112* 111 115*  CO2 23 20* 19* 21* 20*  GLUCOSE 111* 97 106* 102* 99  BUN 20 21 25* 24* 17  CREATININE 1.43* 1.41* 1.35* 1.30* 0.87  CALCIUM 8.5* 7.5* 7.3* 7.1* 7.3*  MG  --   --   --   --  2.3   GFR: Estimated Creatinine Clearance: 63.9 mL/min (by C-G formula based on SCr of 0.87 mg/dL). Liver Function Tests: Recent Labs  Lab 11/15/19 1112  AST 30  ALT 23  ALKPHOS 113  BILITOT 0.9  PROT 7.2  ALBUMIN 3.5   Recent Labs  Lab 11/15/19 1112  LIPASE 16   No results for input(s): AMMONIA in the last 168  hours. Coagulation Profile: No results for input(s): INR, PROTIME in the last 168 hours. Cardiac Enzymes: No results for input(s): CKTOTAL, CKMB, CKMBINDEX, TROPONINI in the last 168 hours. BNP (last 3 results) No results for input(s): PROBNP in the last 8760 hours. HbA1C: No results for input(s): HGBA1C in the last 72 hours. CBG: Recent Labs  Lab 11/16/19 0054  GLUCAP 136*   Lipid Profile: No results for input(s): CHOL, HDL, LDLCALC, TRIG, CHOLHDL, LDLDIRECT in the last 72 hours. Thyroid Function Tests: No results for input(s): TSH, T4TOTAL, FREET4, T3FREE, THYROIDAB in the last 72 hours. Anemia Panel: No results for input(s): VITAMINB12, FOLATE, FERRITIN, TIBC, IRON, RETICCTPCT in the last 72 hours. Sepsis Labs: Recent Labs  Lab 11/15/19 1612 11/16/19 0438 11/17/19 0518  PROCALCITON <0.10 0.10 0.13    Recent Results (from the past 240 hour(s))  SARS CORONAVIRUS 2 (TAT 6-24 HRS) Nasopharyngeal Nasopharyngeal Swab     Status: None   Collection Time: 11/15/19  3:21 PM   Specimen: Nasopharyngeal Swab  Result Value Ref Range Status   SARS Coronavirus 2 NEGATIVE NEGATIVE Final    Comment: (NOTE) SARS-CoV-2 target nucleic acids are NOT DETECTED. The SARS-CoV-2 RNA is generally detectable in upper and lower respiratory specimens during the acute phase of infection. Negative results do not preclude SARS-CoV-2 infection, do not rule out co-infections with other pathogens, and should not be used as the sole basis for treatment  or other patient management decisions. Negative results must be combined with clinical observations, patient history, and epidemiological information. The expected result is Negative. Fact Sheet for Patients: SugarRoll.be Fact Sheet for Healthcare Providers: https://www.woods-mathews.com/ This test is not yet approved or cleared by the [place] FDA and  has been authorized for detection and/or diagnosis of  SARS-CoV-2 by FDA under an Emergency Use Authorization (EUA). This EUA will remain  in effect (meaning this test can be used) for the duration of the COVID-19 declaration under Section 56 4(b)(1) of the Act, 21 U.S.C. section 360bbb-3(b)(1), unless the authorization is terminated or revoked sooner. Performed at [facility], [place] [address] [place], [place] [address]   Urine Culture     Status: Abnormal   Collection Time: 11/15/19  5:17 PM   Specimen: Urine, Clean Catch  Result Value Ref Range Status   Specimen Description   Final    URINE, CLEAN CATCH Performed at [facility], [address], [place], [place] [address]    Special Requests   Final    Normal Performed at [facility], [address] [place], [place] [address]    Culture >=100,000 COLONIES/mL STAPHYLOCOCCUS LUGDUNENSIS (A)  Final   Report Status 11/18/2019 FINAL  Final   Organism ID, Bacteria STAPHYLOCOCCUS LUGDUNENSIS (A)  Final      Susceptibility   Staphylococcus lugdunensis - MIC*    CIPROFLOXACIN <=0.5 SENSITIVE Sensitive     GENTAMICIN <=0.5 SENSITIVE Sensitive     NITROFURANTOIN <=16 SENSITIVE Sensitive     OXACILLIN >=4 RESISTANT Resistant     TETRACYCLINE <=1 SENSITIVE Sensitive     VANCOMYCIN <=0.5 SENSITIVE Sensitive     TRIMETH/SULFA <=10 SENSITIVE Sensitive     CLINDAMYCIN <=0.25 SENSITIVE Sensitive     RIFAMPIN <=0.5 SENSITIVE Sensitive     Inducible Clindamycin NEGATIVE Sensitive     * >=100,000 COLONIES/mL STAPHYLOCOCCUS LUGDUNENSIS         Radiology Studies: No results found.      Scheduled Meds: . amitriptyline  10 mg Oral QHS  . amLODipine  5 mg Oral Daily  . aspirin EC  81 mg Oral Daily  . clonazePAM  0.5 mg Oral QHS  . divalproex  125 mg Oral BID  . donepezil  10 mg Oral QHS  . doxycycline  100 mg Oral BID  . enoxaparin (LOVENOX) injection  40 mg Subcutaneous Q24H  . levothyroxine  50 mcg Oral QAC breakfast  . loratadine  10 mg Oral Daily  .  metoprolol succinate  50 mg Oral Daily  . pantoprazole  40 mg Oral Daily  . psyllium  1 packet Oral Daily   Continuous Infusions: . sodium chloride 50 mL/hr at 11/19/19 1953     LOS: 5 days    Time spent: 25-30 minutes    Ezekiel Slocumb, DO [facility] Pager: [phone]  If 7PM-7AM, please contact night-coverage [email or web address] Password [email or web address] 11/20/2019, 7:31 AM

## 2019-11-20 NOTE — TOC Progression Note (Signed)
Transition of Care [facility]) - Progression Note    Patient Details  Name: Valerie Hall MRN: [id number] Date of Birth: Nov 13, 1951  [facility] CM/SW Contact  Shelbie Hutching, RN Phone Number: 11/20/2019, 8:33 AM  Clinical Narrative:    Patient and family choose [facility] for short term rehab.  Debra with Admissions at [facility] is starting [employer] authorization now.  Patient will need a repeat COVID collected within 48 hours of discharge.   Expected Discharge Plan: [address] Barriers to Discharge: Continued Medical Work up  Expected Discharge Plan and Services Expected Discharge Plan: [place]   Discharge Planning Services: CM Consult   Living arrangements for the past 2 months: Single Family Home                                       Social Determinants of Health (SDOH) Interventions    Readmission Risk Interventions No flowsheet data found.

## 2019-11-20 NOTE — NC FL2 (Signed)
[place] LEVEL OF CARE SCREENING TOOL     IDENTIFICATION  Patient Name: Valerie Hall Birthdate: Dec 28, 1950 Sex: female Admission Date (Current Location): 11/15/2019  [place] and [place] Number:  Engineering geologist and Address:  [facility], [address] [place], [place] [address]      Provider Number: [id number]  Attending Physician Name and Address:  Ezekiel Slocumb, DO  Relative Name and Phone Number:  Debbra Riding [phone]    Current Level of Care: Hospital Recommended Level of Care: [place] Prior Approval Number:    Date Approved/Denied:   PASRR Number: pending  Discharge Plan: SNF    Current Diagnoses: Patient Active Problem List   Diagnosis Date Noted  . AKI (acute kidney injury) ([place]) 11/19/2019  . Acute urinary retention 11/19/2019  . Alzheimer's dementia without behavioral disturbance ([place])   . Hypotension   . Acute cystitis without hematuria   . Pneumonia of right upper lobe due to infectious organism   . Closed right ankle fracture, with routine healing, subsequent encounter   . Slurred speech 11/15/2019  . Acute metabolic encephalopathy   . Lobar pneumonia ([place])   . Essential hypertension   . Acute kidney injury superimposed on CKD ([place])   . Closed fracture of upper end of right fibula   . Personal history of colonic polyps   . Benign neoplasm of transverse colon   . Auditory vertigo 04/30/2016  . Irritable bowel syndrome with diarrhea 04/30/2016  . Clinical depression 04/30/2016  . Degeneration of intervertebral disc of cervical region 04/30/2016  . Crohn's disease ([place]) 04/30/2016  . CFIDS (chronic fatigue and immune dysfunction syndrome) ([place]) 04/30/2016  . Hypothyroidism   . Head revolving around 12/29/2014  . Cephalalgia 12/29/2014  . Excessive falling 12/29/2014  . Appendicular ataxia 12/29/2014  . Neuritis or radiculitis due to rupture of lumbar intervertebral disc  07/13/2014  . Bulge of lumbar disc without myelopathy 07/13/2014    Orientation RESPIRATION BLADDER Height & Weight     Self, Time, Situation, Place  Normal Continent Weight: 79.4 kg Height:  5' 6"  (167.6 cm)  BEHAVIORAL SYMPTOMS/MOOD NEUROLOGICAL BOWEL NUTRITION STATUS      Continent Diet(Dysphagia diet 3- heart healthy)  AMBULATORY STATUS COMMUNICATION OF NEEDS Skin   Limited Assist Verbally Normal                       Personal Care Assistance Level of Assistance  Bathing, Feeding, Dressing Bathing Assistance: Limited assistance Feeding assistance: Limited assistance Dressing Assistance: Limited assistance     Functional Limitations Info             SPECIAL CARE FACTORS FREQUENCY  PT (By licensed PT), OT (By licensed OT)     PT Frequency: 5 times per week OT Frequency: 5 times per week            Contractures Contractures Info: Not present    Additional Factors Info  Code Status, Allergies Code Status Info: Full Allergies Info: Codeine, morphine, pork-derived products, prochorperazine           Current Medications (11/20/2019):  This is the current hospital active medication list Current Facility-Administered Medications  Medication Dose Route Frequency Provider Last Rate Last Dose  . 0.9 %  sodium chloride infusion   Intravenous Continuous Loletha Grayer, MD 50 mL/hr at 11/19/19 1953    . acetaminophen (TYLENOL) tablet 650 mg  650 mg Oral Q4H PRN Loletha Grayer, MD  650 mg at 11/18/19 8295   Or  . acetaminophen (TYLENOL) 160 MG/5ML solution 650 mg  650 mg Per Tube Q4H PRN Loletha Grayer, MD       Or  . acetaminophen (TYLENOL) suppository 650 mg  650 mg Rectal Q4H PRN Wieting, Richard, MD      . amitriptyline (ELAVIL) tablet 10 mg  10 mg Oral QHS Loletha Grayer, MD   10 mg at 11/19/19 2310  . amLODipine (NORVASC) tablet 5 mg  5 mg Oral Daily Nicole Kindred A, DO   5 mg at 11/19/19 0908  . aspirin EC tablet 81 mg  81 mg Oral Daily Loletha Grayer, MD   81 mg at 11/19/19 0908  . clonazePAM (KLONOPIN) tablet 0.5 mg  0.5 mg Oral QHS Loletha Grayer, MD   0.5 mg at 11/19/19 2309  . cloNIDine (CATAPRES) tablet 0.2 mg  0.2 mg Oral TID Nicole Kindred A, DO      . diphenhydrAMINE (BENADRYL) capsule 25 mg  25 mg Oral QHS PRN Nicole Kindred A, DO   25 mg at 11/18/19 1313  . divalproex (DEPAKOTE) DR tablet 125 mg  125 mg Oral BID Loletha Grayer, MD   125 mg at 11/19/19 2309  . donepezil (ARICEPT) tablet 10 mg  10 mg Oral QHS Loletha Grayer, MD   10 mg at 11/19/19 2310  . doxycycline (VIBRA-TABS) tablet 100 mg  100 mg Oral BID Nicole Kindred A, DO   100 mg at 11/19/19 1946  . enoxaparin (LOVENOX) injection 40 mg  40 mg Subcutaneous Q24H Loletha Grayer, MD   40 mg at 11/19/19 2311  . ketorolac (TORADOL) 15 MG/ML injection 15 mg  15 mg Intravenous Q6H PRN Lang Snow, NP   15 mg at 11/19/19 2317  . labetalol (NORMODYNE) injection 10 mg  10 mg Intravenous Q3H PRN Nicole Kindred A, DO   10 mg at 11/18/19 1814  . levothyroxine (SYNTHROID) tablet 50 mcg  50 mcg Oral QAC breakfast Loletha Grayer, MD   50 mcg at 11/20/19 0559  . loperamide (IMODIUM) capsule 2 mg  2 mg Oral PRN Nicole Kindred A, DO   2 mg at 11/18/19 1313  . loratadine (CLARITIN) tablet 10 mg  10 mg Oral Daily Nicole Kindred A, DO   10 mg at 11/19/19 0908  . metoprolol succinate (TOPROL-XL) 24 hr tablet 50 mg  50 mg Oral Daily Nicole Kindred A, DO   50 mg at 11/19/19 0908  . ondansetron (ZOFRAN) injection 4 mg  4 mg Intravenous Q6H PRN Bodenheimer, Charles A, NP   4 mg at 11/20/19 6213  . pantoprazole (PROTONIX) EC tablet 40 mg  40 mg Oral Daily Loletha Grayer, MD   40 mg at 11/19/19 0908  . psyllium (HYDROCIL/METAMUCIL) packet 1 packet  1 packet Oral Daily Nicole Kindred A, DO   1 packet at 11/19/19 1723  . traMADol (ULTRAM) tablet 50 mg  50 mg Oral Q6H PRN Nicole Kindred A, DO   50 mg at 11/19/19 1946     Discharge Medications: Please see discharge  summary for a list of discharge medications.  Relevant Imaging Results:  Relevant Lab Results:   Additional Information SS# [id number]  Shelbie Hutching, RN

## 2019-11-20 NOTE — Progress Notes (Signed)
Physical Therapy Treatment Patient Details Name: Valerie Hall MRN: [id number] DOB: Oct 06, 1951 Today's Date: 11/20/2019    History of Present Illness Pt is a 68 y.o. female presenting to hospital 11/15/19 s/p multiple falls (with R knee pain) and increased weakness past 2 weeks.  Pt noted to be aphasic, slurred speech, and asymmetry of face; also c/o abdominal pain.  Pt admitted with acute metabolic encephalopathy with slurred speech, acute on chronic kidney disease, and PNA.  Imaging showing nondisplaced fx proximal fibula, medial malleolar fx, and suspected subtle fx through the fibular metaphsyis.  CT of head and MRI of brain negative for acute intracranial process.  Pt with unresponsive episode 11/30 early AM with fixed pupils and gaze.  L-spine imaging showing remote L1 superior endplate compression fx with vertebral body height loss about 25% (unchanged).  PMH includes depression, chronic pain, dementia, htn, Menieres disease, Crohn's diesase.    PT Comments    Pt supine in bed and reporting some decreased nausea but still not able to eat her breakfast.  She agreed to transfer to chair to try to sit up and eat once nausea has subsided.  Pt able to perform bed mobility min A with hand held assist to complete sup to sit and scoot forward.  Pt able to stand from elevated bedside and use RW to slide L LE to pivot to chair.  She reported low pain level of R LE during initiation of movement but no report of pain increase during mobility.  She mentioned that she sometimes feels "dizzy" with movement but that she does not at this time.  Pt able to complete all seated there ex with manual assist for hip abduction and SLR.  Pt apprehensive at first about R LE there ex but agreeable after education.  Pt will continue to benefit from skilled PT with focus on strength, pain management, tolerance to activity and safe functional mobility.  Follow Up Recommendations  SNF     Equipment Recommendations  Rolling walker with 5" wheels;3in1 (PT);Wheelchair (measurements PT);Wheelchair cushion (measurements PT)    Recommendations for Other Services       Precautions / Restrictions Precautions Precautions: Fall Other Brace: R ankle in splint Restrictions Weight Bearing Restrictions: Yes RLE Weight Bearing: Non weight bearing    Mobility  Bed Mobility Overal bed mobility: Needs Assistance       Supine to sit: Min assist     General bed mobility comments: Able to bring LE's over EOB, hand held assist to elevate trunk and scoot forward.  Pt performed majority of mobility on her own.  Transfers Overall transfer level: Needs assistance Equipment used: Rolling walker (2 wheeled) Transfers: Stand Pivot Transfers   Stand pivot transfers: From elevated surface;Min assist       General transfer comment: Able to stand from bedside with close assistance to stabilize RW.  Pt able to pivot and required VC's to avoid toe touch to floor with R LE 2x.  Controlled descent to chair with UE's.  Ambulation/Gait                 Stairs             Wheelchair Mobility    Modified Rankin (Stroke Patients Only)       Balance Overall balance assessment: Needs assistance Sitting-balance support: No upper extremity supported;Feet unsupported Sitting balance-Leahy Scale: Good     Standing balance support: Bilateral upper extremity supported Standing balance-Leahy Scale: Fair Standing balance comment: heavy reliance on walker  but overall does well +2 for safety with transfer.                            Cognition Arousal/Alertness: Awake/alert Behavior During Therapy: WFL for tasks assessed/performed Overall Cognitive Status: Within Functional Limits for tasks assessed                                 General Comments: Follows commands consistently.  Appeared to not understand PT during education 1-2 times and stated that she needed to talk to her  daughter to "fill in the gaps" about what happened when she fell because she can't remember.      Exercises General Exercises - Lower Extremity Ankle Circles/Pumps: Left;20 reps;Seated;AROM Quad Sets: Strengthening;Both;10 reps;Seated Gluteal Sets: Strengthening;Both;5 reps;Seated Hip ABduction/ADduction: AAROM;10 reps;Seated;Right Straight Leg Raises: AAROM;Right;10 reps;Seated    General Comments        Pertinent Vitals/Pain Pain Assessment: Faces Faces Pain Scale: Hurts a little bit Pain Location: R ankle with initial mobility. Pain Descriptors / Indicators: Grimacing;Guarding Pain Intervention(s): Limited activity within patient's tolerance;Monitored during session;Premedicated before session    Home Living                      Prior Function            PT Goals (current goals can now be found in the care plan section) Acute Rehab PT Goals Patient Stated Goal: to improve mobility PT Goal Formulation: With patient Time For Goal Achievement: 12/01/19 Potential to Achieve Goals: Fair Progress towards PT goals: Progressing toward goals    Frequency    7X/week      PT Plan Current plan remains appropriate    Co-evaluation              AM-PAC PT "6 Clicks" Mobility   Outcome Measure  Help needed turning from your back to your side while in a flat bed without using bedrails?: A Little Help needed moving from lying on your back to sitting on the side of a flat bed without using bedrails?: A Little Help needed moving to and from a bed to a chair (including a wheelchair)?: A Lot Help needed standing up from a chair using your arms (e.g., wheelchair or bedside chair)?: A Little Help needed to walk in hospital room?: A Lot Help needed climbing 3-5 steps with a railing? : Total 6 Click Score: 14    End of Session Equipment Utilized During Treatment: Gait belt Activity Tolerance: Patient tolerated treatment well Patient left: in chair;with call  bell/phone within reach;with chair alarm set Nurse Communication: Mobility status PT Visit Diagnosis: Other abnormalities of gait and mobility (R26.89);Muscle weakness (generalized) (M62.81);Repeated falls (R29.6);History of falling (Z91.81);Difficulty in walking, not elsewhere classified (R26.2);Pain Pain - Right/Left: Right Pain - part of body: Ankle and joints of foot     Time: 1002-1025 PT Time Calculation (min) (ACUTE ONLY): 23 min  Charges:  $Therapeutic Exercise: 23-37 mins                     Roxanne Gates, PT, DPT    Roxanne Gates 11/20/2019, 10:38 AM

## 2019-11-20 NOTE — Progress Notes (Signed)
Occupational Therapy Treatment Patient Details Name: Valerie Hall MRN: [id number] DOB: Sep 14, 1951 Today's Date: 11/20/2019    History of present illness Pt is a 68 y.o. female presenting to hospital 11/15/19 s/p multiple falls (with R knee pain) and increased weakness past 2 weeks.  Pt noted to be aphasic, slurred speech, and asymmetry of face; also c/o abdominal pain.  Pt admitted with acute metabolic encephalopathy with slurred speech, acute on chronic kidney disease, and PNA.  Imaging showing nondisplaced fx proximal fibula, medial malleolar fx, and suspected subtle fx through the fibular metaphsyis.  CT of head and MRI of brain negative for acute intracranial process.  Pt with unresponsive episode 11/30 early AM with fixed pupils and gaze.  L-spine imaging showing remote L1 superior endplate compression fx with vertebral body height loss about 25% (unchanged).  PMH includes depression, chronic pain, dementia, htn, Menieres disease, Crohn's diesase.   OT comments  Pt seen for OT tx this date to f/u re: independence and safety with performance of ADL mobility and self care BADLs. Pt initially apprehensive to participate with OT citing fatigue from sitting up in recliner after PT for 2 hours this date. OT facilitates education re: importance of maintaining independence with self care and pt agreeable to participation with OT to get off bed pan with MIN A for lateral rolling, perform peri care with MOD A and MIN verbal cues, and to reposition in bed with MOD A for propulsion towards HOB. Pt tolerated well, but demos some self limiting. Overall SNF is still most prudent d/c disposition given pt's need for assist and decreased fxl activity tolerance.   Follow Up Recommendations  SNF;Supervision/Assistance - 24 hour    Equipment Recommendations  Other (comment)(defer to next venue of care)    Recommendations for Other Services      Precautions / Restrictions Precautions Precautions:  Fall Required Braces or Orthoses: Other Brace Other Brace: R ankle in splint Restrictions Weight Bearing Restrictions: Yes RLE Weight Bearing: Non weight bearing       Mobility Bed Mobility Overal bed mobility: Needs Assistance Bed Mobility: Rolling Rolling: Min guard         General bed mobility comments: verbal cues provided for hand placement with lateral rolling during toileting and peri care. In addition, OT assists pt with hand/foot placement to particiapte in propulsion towards [facility] With MIN/MOD A.  Transfers                 General transfer comment: transfers deferred with OT this date, pt cites fatigue.    Balance       Sitting balance - Comments: deferred       Standing balance comment: deferred                           ADL either performed or assessed with clinical judgement   ADL       Grooming: Wash/dry hands;Set up;Bed level Grooming Details (indicate cue type and reason): with HOB elevated to seated position with use of wash cloths                     Toileting- Clothing Manipulation and Hygiene: Moderate assistance;Bed level Toileting - Clothing Manipulation Details (indicate cue type and reason): using roling technique off of bed pan and to participate in peri care. Pt able to complete 50%, but requires assistance to remove bed pan and thoroughly complete peri care.  General ADL Comments: Pt declines participation in EOB sitting to participate in ADLs, and declines to particiapte in transfers citing being up in chair 2 hours today and being fatigued.     Vision Patient Visual Report: No change from baseline     Perception     Praxis      Cognition Arousal/Alertness: Awake/alert Behavior During Therapy: WFL for tasks assessed/performed Overall Cognitive Status: Within Functional Limits for tasks assessed                                 General Comments: Pt very clear an appropriate during  this session, some increased processing time required with re-education re: role of OT        Exercises Other Exercises Other Exercises: Pt declines to participate with OT initially, but states she needs help getting off bed pan and getting cleaned up. OT provides re-education re: role of OT including participation in self care ADLs. Pt becomes agreeable to particiaption with OT and agreeable to encouragement to compelte as much of self care as possible.   Shoulder Instructions       General Comments      Pertinent Vitals/ Pain       Pain Assessment: Faces Faces Pain Scale: Hurts a little bit Pain Location: R ankle with particiaption in bed mobility Pain Descriptors / Indicators: Grimacing;Guarding Pain Intervention(s): Monitored during session  Home Living                                          Prior Functioning/Environment              Frequency  Min 2X/week        Progress Toward Goals  OT Goals(current goals can now be found in the care plan section)  Progress towards OT goals: Progressing toward goals  Acute Rehab OT Goals Patient Stated Goal: to improve mobility OT Goal Formulation: With patient Time For Goal Achievement: 12/02/19 Potential to Achieve Goals: Good  Plan Discharge plan remains appropriate    Co-evaluation                 AM-PAC OT "6 Clicks" Daily Activity     Outcome Measure   Help from another person eating meals?: None Help from another person taking care of personal grooming?: A Little Help from another person toileting, which includes using toliet, bedpan, or urinal?: A Lot Help from another person bathing (including washing, rinsing, drying)?: A Lot Help from another person to put on and taking off regular upper body clothing?: A Little Help from another person to put on and taking off regular lower body clothing?: A Lot 6 Click Score: 16    End of Session    OT Visit Diagnosis: Unsteadiness on feet  (R26.81);History of falling (Z91.81);Muscle weakness (generalized) (M62.81)   Activity Tolerance Patient limited by fatigue   Patient Left in bed;with call bell/phone within reach;with bed alarm set   Nurse Communication (notified RN that pt's IV beeping and that pt off bedpan)        Time: 9892-1194 OT Time Calculation (min): 15 min  Charges: OT General Charges $OT Visit: 1 Visit OT Treatments $Self Care/Home Management : 8-22 mins    Gerrianne Scale, [place], OTR/L ascom [phone] 11/20/19, 4:57 PM

## 2019-11-20 NOTE — Progress Notes (Signed)
PT Cancellation Note  Patient Details Name: Valerie Hall MRN: [id number] DOB: Apr 30, 1951   Cancelled Treatment:    Reason Eval/Treat Not Completed: Patient declined, no reason specified.  Pt supine in bed and reporting nausea and not being able to eat breakfast yet for this reason.  RN is aware and PT will check back shortly when pt is more appropriate.  Roxanne Gates, PT, DPT  Roxanne Gates 11/20/2019, 9:23 AM

## 2019-11-21 NOTE — Progress Notes (Signed)
PROGRESS NOTE    Valerie Hall  ZWC:[id number] DOB: [birth year] DOA: 11/15/2019  PCP: Idelle Crouch, MD    LOS - 6   Brief Narrative:  68 y.o.femalewith a known history ofhypertension, Mnire's disease, dementia presents to the hospital withrecurrentfallsover previous week or more.Patient reportedly also dropping objects, had been confused, per family, and then had some slurred speech and facial droop, which prompted coming to ED for evaluation. In the ED, CT head negative. CT of cervical spine showed possible RUL pneumonia. Xray of RLE showed proximal fibula fracture. Patient admitted for further evaluation and management. Evaluation for possible stroke included MRI brain, carotid duplex and echocardiogram which were unrevealing. Neurology consulted. Suspect falls and apparent neurologic symptoms could be secondary to polypharmacy, medications reviewed and adjustments made including gabapentin held, decreased TCA dose, stopped Remeron and decreased Klonopin. Ortho consulted for fibula fracture. RLE to remain in split, non-weightbearing on RLE and follow up with Dr. Mack Guise 1 week after discharge.   Subjective 12/5: Patient seen this morning, awake in bed.  Son is at bedside today.  No acute events reported overnight.  Patient reports intermittent nausea but no vomiting, fevers or chills.  Reports her diarrhea is better since adding Metamucil.  Assessment & Plan:   Active Problems:   Hypothyroidism   Slurred speech   Acute metabolic encephalopathy   Essential hypertension   Hypotension   Acute cystitis without hematuria   Closed right ankle fracture, with routine healing, subsequent encounter   Alzheimer's dementia without behavioral disturbance (HCC)   AKI (acute kidney injury) ([place])   Acute urinary retention   Acute metabolic encephalopathy-improved Slurred speech Recurrent falls -Evaluation for stroke including MRI brain negative -Continue to hold  gabapentin -Continue reduced dose of amitriptyline -Stop Remeron -Decreased home Klonopin -Treating for infection as below -PT OT  -to SNF for rehab, social work on board, [employer] pending  Acute cystitiswithout hematuria Possible pneumonia, based on imaging -urine culture grew staph lugdunensis, oxacillin resistant, otherwise pan-sensitive -continue doxycycline, to finish course 12/7  Right ankle and proximal fibula fractures -Tylenol and Ultram as needed for pain -Ortho consulted, Dr. Theone Murdoch right lower extremity to remain in splint, nonweightbearing on the right lower extremity, follow up in clinic 7-10 after discharge.  Acute kidney injury superimposed on CKD stage IIIa  Urinary retention -AKI resolved with hydration -bladder scans if not voiding -In and out cath as needed for urinary retention  Hypotension History of hypertension BP meds initially held for hypotension, this is resolved and medications resumed.  Patient tends to have mildly elevated systolic pressures but low diastolic. -Resume Toprol-XL 50 mg daily  -Resume clonidine 0.2 mg TID -Added Norvasc 5 mg daily -labetalol IV PRN  Hypothyroidism -Continue home levothyroxine  Insomnia Anxiety/depression -Reduced doses of amitriptyline and Klonopin as above  Alzheimer's dementia,without behavioral disturbance -Continue home Aricept    DVT prophylaxis:Lovenox Code Status: Full Code Family Communication:None at bedside Disposition Plan:discharge to SNF for rehab pending insurance authorization  Consultants:  Orthopedics  Neurology   Antimicrobials:  Rocephin and azithromycin- stop 12/3  Doxycycline - start 12/3, planned stop 12/7   Objective: Vitals:   11/19/19 2307 11/20/19 1626 11/20/19 2214 11/20/19 2330  BP: (!) 169/92 (!) 138/47 (!) 167/64 (!) 125/57  Pulse: 87 77 72 65  Resp: 17 15  17   Temp: 98.2 F (36.8 C) 98.8 F (37.1 C)  98.7 F  (37.1 C)  TempSrc:  Oral  Oral  SpO2: 96% 99%  97%  Weight:  Height:        Intake/Output Summary (Last 24 hours) at 11/21/2019 0800 Last data filed at 11/21/2019 0541 Gross per 24 hour  Intake 1172.47 ml  Output 300 ml  Net 872.47 ml   Filed Weights   11/15/19 1108  Weight: 79.4 kg    Examination:  General exam: awake, alert, no acute distress Respiratory system: clear to auscultation bilaterally, no wheezes, rales or rhonchi, normal respiratory effort. Cardiovascular system: normal S1/S2, RRR, no JVD, murmurs, rubs, gallops, no pedal edema.   Gastrointestinal system: soft, non-tender, non-distended abdomen Extremities: Right lower extremity in splint and dressing intact, distal sensation intact   Data Reviewed: I have personally reviewed following labs and imaging studies  CBC: Recent Labs  Lab 11/15/19 1112 11/17/19 0518 11/19/19 0349  WBC 16.0* 14.1* 7.8  NEUTROABS 13.7*  --   --   HGB 13.5 11.0* 10.1*  HCT 40.7 35.1* 31.9*  MCV 96.7 101.4* 99.7  PLT 244 166 235   Basic Metabolic Panel: Recent Labs  Lab 11/15/19 1112 11/16/19 0438 11/17/19 0518 11/18/19 0427 11/19/19 0349  NA 140 138 139 140 143  K 4.1 3.4* 3.7 3.7 4.2  CL 107 107 112* 111 115*  CO2 23 20* 19* 21* 20*  GLUCOSE 111* 97 106* 102* 99  BUN 20 21 25* 24* 17  CREATININE 1.43* 1.41* 1.35* 1.30* 0.87  CALCIUM 8.5* 7.5* 7.3* 7.1* 7.3*  MG  --   --   --   --  2.3   GFR: Estimated Creatinine Clearance: 63.9 mL/min (by C-G formula based on SCr of 0.87 mg/dL). Liver Function Tests: Recent Labs  Lab 11/15/19 1112  AST 30  ALT 23  ALKPHOS 113  BILITOT 0.9  PROT 7.2  ALBUMIN 3.5   Recent Labs  Lab 11/15/19 1112  LIPASE 16   No results for input(s): AMMONIA in the last 168 hours. Coagulation Profile: No results for input(s): INR, PROTIME in the last 168 hours. Cardiac Enzymes: No results for input(s): CKTOTAL, CKMB, CKMBINDEX, TROPONINI in the last 168 hours. BNP (last 3  results) No results for input(s): PROBNP in the last 8760 hours. HbA1C: No results for input(s): HGBA1C in the last 72 hours. CBG: Recent Labs  Lab 11/16/19 0054  GLUCAP 136*   Lipid Profile: No results for input(s): CHOL, HDL, LDLCALC, TRIG, CHOLHDL, LDLDIRECT in the last 72 hours. Thyroid Function Tests: No results for input(s): TSH, T4TOTAL, FREET4, T3FREE, THYROIDAB in the last 72 hours. Anemia Panel: No results for input(s): VITAMINB12, FOLATE, FERRITIN, TIBC, IRON, RETICCTPCT in the last 72 hours. Sepsis Labs: Recent Labs  Lab 11/15/19 1612 11/16/19 0438 11/17/19 0518  PROCALCITON <0.10 0.10 0.13    Recent Results (from the past 240 hour(s))  SARS CORONAVIRUS 2 (TAT 6-24 HRS) Nasopharyngeal Nasopharyngeal Swab     Status: None   Collection Time: 11/15/19  3:21 PM   Specimen: Nasopharyngeal Swab  Result Value Ref Range Status   SARS Coronavirus 2 NEGATIVE NEGATIVE Final    Comment: (NOTE) SARS-CoV-2 target nucleic acids are NOT DETECTED. The SARS-CoV-2 RNA is generally detectable in upper and lower respiratory specimens during the acute phase of infection. Negative results do not preclude SARS-CoV-2 infection, do not rule out co-infections with other pathogens, and should not be used as the sole basis for treatment or other patient management decisions. Negative results must be combined with clinical observations, patient history, and epidemiological information. The expected result is Negative. Fact Sheet for Patients: SugarRoll.be Fact Sheet for Healthcare  Providers: https://www.woods-mathews.com/ This test is not yet approved or cleared by the [place] and  has been authorized for detection and/or diagnosis of SARS-CoV-2 by FDA under an Emergency Use Authorization (EUA). This EUA will remain  in effect (meaning this test can be used) for the duration of the COVID-19 declaration under Section 56 4(b)(1) of the  Act, 21 U.S.C. section 360bbb-3(b)(1), unless the authorization is terminated or revoked sooner. Performed at [facility], [place] [address] [place], [place] [address]   Urine Culture     Status: Abnormal   Collection Time: 11/15/19  5:17 PM   Specimen: Urine, Clean Catch  Result Value Ref Range Status   Specimen Description   Final    URINE, CLEAN CATCH Performed at [facility], [address], [place], [place] [address]    Special Requests   Final    Normal Performed at [facility], [address] [place], [place] [address]    Culture >=100,000 COLONIES/mL STAPHYLOCOCCUS LUGDUNENSIS (A)  Final   Report Status 11/18/2019 FINAL  Final   Organism ID, Bacteria STAPHYLOCOCCUS LUGDUNENSIS (A)  Final      Susceptibility   Staphylococcus lugdunensis - MIC*    CIPROFLOXACIN <=0.5 SENSITIVE Sensitive     GENTAMICIN <=0.5 SENSITIVE Sensitive     NITROFURANTOIN <=16 SENSITIVE Sensitive     OXACILLIN >=4 RESISTANT Resistant     TETRACYCLINE <=1 SENSITIVE Sensitive     VANCOMYCIN <=0.5 SENSITIVE Sensitive     TRIMETH/SULFA <=10 SENSITIVE Sensitive     CLINDAMYCIN <=0.25 SENSITIVE Sensitive     RIFAMPIN <=0.5 SENSITIVE Sensitive     Inducible Clindamycin NEGATIVE Sensitive     * >=100,000 COLONIES/mL STAPHYLOCOCCUS LUGDUNENSIS         Radiology Studies: No results found.      Scheduled Meds: . amitriptyline  10 mg Oral QHS  . amLODipine  5 mg Oral Daily  . aspirin EC  81 mg Oral Daily  . clonazePAM  0.5 mg Oral QHS  . cloNIDine  0.2 mg Oral TID  . divalproex  125 mg Oral BID  . donepezil  10 mg Oral QHS  . doxycycline  100 mg Oral BID  . enoxaparin (LOVENOX) injection  40 mg Subcutaneous Q24H  . levothyroxine  50 mcg Oral QAC breakfast  . loratadine  10 mg Oral Daily  . metoprolol succinate  50 mg Oral Daily  . pantoprazole  40 mg Oral Daily  . psyllium  1 packet Oral Daily   Continuous Infusions: . sodium chloride 50 mL/hr at 11/21/19  0541     LOS: 6 days    Time spent: 20 minutes    Ezekiel Slocumb, DO [facility] Pager: [phone]  If 7PM-7AM, please contact night-coverage [email or web address] Password [email or web address] 11/21/2019, 8:00 AM

## 2019-11-21 NOTE — Care Management (Signed)
Pending Authorization for SNF at [facility]

## 2019-11-21 NOTE — Progress Notes (Addendum)
Physical Therapy Treatment Patient Details Name: Valerie Hall MRN: [id number] DOB: [birth year] Today's Date: 11/21/2019    History of Present Illness Pt is a 68 y.o. female presenting to hospital 11/15/19 s/p multiple falls (with R knee pain) and increased weakness past 2 weeks.  Pt noted to be aphasic, slurred speech, and asymmetry of face; also c/o abdominal pain.  Pt admitted with acute metabolic encephalopathy with slurred speech, acute on chronic kidney disease, and PNA.  Imaging showing nondisplaced fx proximal fibula, medial malleolar fx, and suspected subtle fx through the fibular metaphsyis.  CT of head and MRI of brain negative for acute intracranial process.  Pt with unresponsive episode 11/30 early AM with fixed pupils and gaze.  L-spine imaging showing remote L1 superior endplate compression fx with vertebral body height loss about 25% (unchanged).  PMH includes depression, chronic pain, dementia, htn, Menieres disease, Crohn's diesase.    PT Comments    In bed with reports of nausea asking for bedpan.  Encouraged bedside commode and she agreed.  To edge of bed with rail but no physical assist.  Stood and transferred to commode to void with min assist and was able to attend to her own care needs.  Stood and transferred 180 degrees with walker to recliner at bedside.  She continued to need verbal cues for hand placements, to keep walker closer and for proper sequencing.  She still needs +1 hand on assist at all times for transfers and gait remains limited but she does well maintaining NWB status.  Declines further activity at this time to finish breakfast and allow nausea to subside.     Follow Up Recommendations  SNF     Equipment Recommendations  Rolling walker with 5" wheels;3in1 (PT);Wheelchair (measurements PT);Wheelchair cushion (measurements PT)    Recommendations for Other Services       Precautions / Restrictions Precautions Precautions: Fall Restrictions Weight  Bearing Restrictions: Yes RLE Weight Bearing: Non weight bearing    Mobility  Bed Mobility Overal bed mobility: Needs Assistance       Supine to sit: Min guard     General bed mobility comments: uses rail but no outside assist today.  Transfers Overall transfer level: Needs assistance Equipment used: Rolling walker (2 wheeled) Transfers: Sit to/from Stand Sit to Stand: Min assist            Ambulation/Gait Ambulation/Gait assistance: Herbalist (Feet): 3 Feet Assistive device: Rolling walker (2 wheeled)   Gait velocity: decreased   General Gait Details: hop-to does well with NWB LLE this session   Stairs             Wheelchair Mobility    Modified Rankin (Stroke Patients Only)       Balance Overall balance assessment: Needs assistance Sitting-balance support: No upper extremity supported;Feet unsupported Sitting balance-Leahy Scale: Good     Standing balance support: Bilateral upper extremity supported Standing balance-Leahy Scale: Fair Standing balance comment: heavy reliance on walker but overall does well +2 with transfer.                            Cognition Arousal/Alertness: Awake/alert Behavior During Therapy: WFL for tasks assessed/performed Overall Cognitive Status: Within Functional Limits for tasks assessed  Exercises Other Exercises Other Exercises: to bedside commode to void    General Comments        Pertinent Vitals/Pain Pain Assessment: Faces Faces Pain Scale: Hurts a little bit Pain Location: R ankle with particiaption in  mobility Pain Descriptors / Indicators: Grimacing;Guarding Pain Intervention(s): Monitored during session;Repositioned    Home Living                      Prior Function            PT Goals (current goals can now be found in the care plan section) Progress towards PT goals: Progressing toward goals     Frequency    7X/week      PT Plan Current plan remains appropriate    Co-evaluation              AM-PAC PT "6 Clicks" Mobility   Outcome Measure  Help needed turning from your back to your side while in a flat bed without using bedrails?: None Help needed moving from lying on your back to sitting on the side of a flat bed without using bedrails?: A Little Help needed moving to and from a bed to a chair (including a wheelchair)?: A Little Help needed standing up from a chair using your arms (e.g., wheelchair or bedside chair)?: A Little Help needed to walk in hospital room?: A Lot Help needed climbing 3-5 steps with a railing? : Total 6 Click Score: 16    End of Session Equipment Utilized During Treatment: Gait belt Activity Tolerance: Patient tolerated treatment well Patient left: in chair;with call bell/phone within reach;with chair alarm set Nurse Communication: Mobility status Pain - Right/Left: Right Pain - part of body: Ankle and joints of foot     Time: 5537-4827 PT Time Calculation (min) (ACUTE ONLY): 10 min  Charges:  $Therapeutic Activity: 8-22 mins                    Chesley Noon, PTA 11/21/19, 9:31 AM

## 2019-11-22 LAB — CREATININE, SERUM
Creatinine, Ser: 0.8 mg/dL (ref 0.44–1.00)
GFR calc Af Amer: >60 mL/min (ref >60)
GFR calc non Af Amer: >60 mL/min (ref >60)

## 2019-11-22 MED ORDER — MIRTAZAPINE 15 MG PO TABS
30.0000 mg | ORAL_TABLET | Freq: Every day | ORAL | Status: DC
  Administered 2019-11-22 – 2019-11-24 (×3): 30 mg via ORAL
  Filled 2019-11-22 (×3): qty 2

## 2019-11-22 NOTE — Progress Notes (Signed)
Physical Therapy Treatment Patient Details Name: Valerie Hall MRN: [id number] DOB: 31-Oct-1951 Today's Date: 11/22/2019    History of Present Illness Pt is a 68 y.o. female presenting to hospital 11/15/19 s/p multiple falls (with R knee pain) and increased weakness past 2 weeks.  Pt noted to be aphasic, slurred speech, and asymmetry of face; also c/o abdominal pain.  Pt admitted with acute metabolic encephalopathy with slurred speech, acute on chronic kidney disease, and PNA.  Imaging showing nondisplaced fx proximal fibula, medial malleolar fx, and suspected subtle fx through the fibular metaphsyis.  CT of head and MRI of brain negative for acute intracranial process.  Pt with unresponsive episode 11/30 early AM with fixed pupils and gaze.  L-spine imaging showing remote L1 superior endplate compression fx with vertebral body height loss about 25% (unchanged).  PMH includes depression, chronic pain, dementia, htn, Menieres disease, Crohn's diesase.    PT Comments    Patient performs BLE therapeutic exercise including BLE: heel slides, SLR, SAQ, LAQ, seated marching x 10. Patient refuses transfers or gait training today due to fatigue and pain "everwhere". She will continue to benefit from skilled PT to improve mobility and strength.   Follow Up Recommendations  SNF     Equipment Recommendations  (RW)    Recommendations for Other Services       Precautions / Restrictions Restrictions Weight Bearing Restrictions: Yes RLE Weight Bearing: Non weight bearing    Mobility  Bed Mobility Overal bed mobility: Modified Independent Bed Mobility: Supine to Sit;Sit to Supine Rolling: Supervision   Supine to sit: Supervision Sit to supine: Supervision   General bed mobility comments: uses rail  Transfers Overall transfer level: (pateint refused)                  Ambulation/Gait Ambulation/Gait assistance: (refused)               Stairs             Wheelchair  Mobility    Modified Rankin (Stroke Patients Only)       Balance Overall balance assessment: Modified Independent Sitting-balance support: Bilateral upper extremity supported Sitting balance-Leahy Scale: Good                                      Cognition Arousal/Alertness: Awake/alert Behavior During Therapy: WFL for tasks assessed/performed Overall Cognitive Status: Within Functional Limits for tasks assessed                                        Exercises      General Comments        Pertinent Vitals/Pain Pain Assessment: 0-10 Faces Pain Scale: Hurts a little bit Pain Location: Right ankle Pain Descriptors / Indicators: Discomfort Pain Intervention(s): Monitored during session    Home Living                      Prior Function            PT Goals (current goals can now be found in the care plan section) Progress towards PT goals: Progressing toward goals    Frequency    7X/week      PT Plan Current plan remains appropriate    Co-evaluation  AM-PAC PT "6 Clicks" Mobility   Outcome Measure  Help needed turning from your back to your side while in a flat bed without using bedrails?: None Help needed moving from lying on your back to sitting on the side of a flat bed without using bedrails?: None Help needed moving to and from a bed to a chair (including a wheelchair)?: A Little Help needed standing up from a chair using your arms (e.g., wheelchair or bedside chair)?: A Little Help needed to walk in hospital room?: A Lot Help needed climbing 3-5 steps with a railing? : Total 6 Click Score: 17    End of Session   Activity Tolerance: Patient tolerated treatment well Patient left: in bed;with bed alarm set Nurse Communication: Mobility status Pain - Right/Left: Right Pain - part of body: Ankle and joints of foot     Time: 5009-3818 PT Time Calculation (min) (ACUTE ONLY): 15  min  Charges:  $Therapeutic Exercise: 8-22 mins                        Alanson Puls , PT DPT 11/22/2019, 10:26 AM

## 2019-11-22 NOTE — Progress Notes (Signed)
PROGRESS NOTE    Valerie Hall  JJK:[id number] DOB: 08/04/1951 DOA: 11/15/2019  PCP: Idelle Crouch, MD    LOS - 7   Brief Narrative:  68 y.o.femalewith a known history ofhypertension, Mnire's disease, dementia presents to the hospital withrecurrentfallsover previous week or more.Patient reportedly also dropping objects, had been confused, per family, and then had some slurred speech and facial droop, which prompted coming to ED for evaluation. In the ED, CT head negative. CT of cervical spine showed possible RUL pneumonia. Xray of RLE showed proximal fibula fracture. Patient admitted for further evaluation and management. Evaluation for possible stroke included MRI brain, carotid duplex and echocardiogram which were unrevealing. Neurology consulted. Suspect falls and apparent neurologic symptoms could be secondary to polypharmacy, medications reviewed and adjustments made including gabapentin held, decreased TCA dose, stopped Remeron and decreased Klonopin. Ortho consulted for fibula fracture. RLE to remain in split, non-weightbearingon RLE and follow up with Dr. Mack Guise 1 week after discharge.   Subjective 12/6: Patient awake laying in bed when seen this AM. No acute events overnight. Nausea improved.  Diarrhea resolved, she stopped using the Metamucil for now.  Is having some increased right ankle pain today.  Assessment & Plan:   Active Problems:   Hypothyroidism   Slurred speech   Acute metabolic encephalopathy   Essential hypertension   Hypotension   Acute cystitis without hematuria   Closed right ankle fracture, with routine healing, subsequent encounter   Alzheimer's dementia without behavioral disturbance ([place])   AKI (acute kidney injury) ([place])   Acute urinary retention   Acute metabolic encephalopathy-improved Slurred speech Recurrent falls -Evaluation for stroke including MRI brain negative -Continue to hold gabapentin -Continue reduced  dose of amitriptyline -Will resume Remeron as patient has had poor appetite. Monitor mental status with this. -Decreased home Klonopin -Treating for infection as below -PT OT -to SNF for rehab, social work on board, [employer]er pending  Acute cystitiswithout hematuria Possible pneumonia, based on imaging -urine culture grew staph lugdunensis, oxacillin resistant, otherwise pan-sensitive -continuedoxycycline,to finish course 12/7  Right ankle and proximal fibula fractures -Tylenol and Ultram as needed for pain -Ortho consulted, Dr. Theone Murdoch right lower extremity to remain in splint, nonweightbearing on the right lower extremity,follow up in clinic 7-10 afterdischarge.  Acute kidney injury superimposed on CKD stage IIIa  Urinary retention -AKI resolved with hydration -bladder scans if not voiding -In and out cath as needed for urinary retention  Hypotension History of hypertension BP meds initially held for hypotension, this is resolved and medications resumed.  Patient tends to have mildly elevated systolic pressures but low diastolic. -Resume [id number] mg daily  -Resume clonidine 0.2 mg TID -Added Norvasc 5 mg daily -labetalol IV PRN  Hypothyroidism -Continue home levothyroxine  Insomnia Anxiety/depression -Reduced doses of amitriptyline and Klonopin as above  Alzheimer's dementia,without behavioral disturbance -Continue home Aricept    DVT prophylaxis:Lovenox Code Status: Full Code Family Communication:None at bedside Disposition Plan:discharge to SNF for rehab pending insurance authorization  Consultants:  Orthopedics  Neurology   Antimicrobials:  Rocephin and azithromycin- stop 12/3  Doxycycline - start 12/3, planned stop 12/7   Objective: Vitals:   11/20/19 2330 11/21/19 0809 11/21/19 1558 11/21/19 2349  BP: (!) 125/57 (!) 142/62 (!) 154/60 (!) 142/60  Pulse: 65 70 64 60  Resp: 17 20 20 15   Temp:  98.7 F (37.1 C) 97.9 F (36.6 C) 98.5 F (36.9 C) 98.4 F (36.9 C)  TempSrc: Oral Oral Oral Oral  SpO2: 97% 100% 98%  96%  Weight:      Height:        Intake/Output Summary (Last 24 hours) at 11/22/2019 0746 Last data filed at 11/21/2019 1536 Gross per 24 hour  Intake 734.84 ml  Output -  Net 734.84 ml   Filed Weights   11/15/19 1108  Weight: 79.4 kg    Examination:  General exam: awake, alert, no acute distress Respiratory system: clear to auscultation bilaterally, no wheezes, rales or rhonchi, normal respiratory effort. Cardiovascular system: normal S1/S2, RRR, no JVD, murmurs, rubs, gallops, no pedal edema.   Central nervous system: alert and oriented x4. no gross focal neurologic deficits, normal speech Extremities: right lower extremity dressing and splint intact, distal sensation intact, no edema, normal tone    Data Reviewed: I have personally reviewed following labs and imaging studies  CBC: Recent Labs  Lab 11/15/19 1112 11/17/19 0518 11/19/19 0349  WBC 16.0* 14.1* 7.8  NEUTROABS 13.7*  --   --   HGB 13.5 11.0* 10.1*  HCT 40.7 35.1* 31.9*  MCV 96.7 101.4* 99.7  PLT 244 166 409   Basic Metabolic Panel: Recent Labs  Lab 11/15/19 1112 11/16/19 0438 11/17/19 0518 11/18/19 0427 11/19/19 0349 11/22/19 0453  NA 140 138 139 140 143  --   K 4.1 3.4* 3.7 3.7 4.2  --   CL 107 107 112* 111 115*  --   CO2 23 20* 19* 21* 20*  --   GLUCOSE 111* 97 106* 102* 99  --   BUN 20 21 25* 24* 17  --   CREATININE 1.43* 1.41* 1.35* 1.30* 0.87 0.80  CALCIUM 8.5* 7.5* 7.3* 7.1* 7.3*  --   MG  --   --   --   --  2.3  --    GFR: Estimated Creatinine Clearance: 69.5 mL/min (by C-G formula based on SCr of 0.8 mg/dL). Liver Function Tests: Recent Labs  Lab 11/15/19 1112  AST 30  ALT 23  ALKPHOS 113  BILITOT 0.9  PROT 7.2  ALBUMIN 3.5   Recent Labs  Lab 11/15/19 1112  LIPASE 16   No results for input(s): AMMONIA in the last 168 hours. Coagulation Profile:  No results for input(s): INR, PROTIME in the last 168 hours. Cardiac Enzymes: No results for input(s): CKTOTAL, CKMB, CKMBINDEX, TROPONINI in the last 168 hours. BNP (last 3 results) No results for input(s): PROBNP in the last 8760 hours. HbA1C: No results for input(s): HGBA1C in the last 72 hours. CBG: Recent Labs  Lab 11/16/19 0054  GLUCAP 136*   Lipid Profile: No results for input(s): CHOL, HDL, LDLCALC, TRIG, CHOLHDL, LDLDIRECT in the last 72 hours. Thyroid Function Tests: No results for input(s): TSH, T4TOTAL, FREET4, T3FREE, THYROIDAB in the last 72 hours. Anemia Panel: No results for input(s): VITAMINB12, FOLATE, FERRITIN, TIBC, IRON, RETICCTPCT in the last 72 hours. Sepsis Labs: Recent Labs  Lab 11/15/19 1612 11/16/19 0438 11/17/19 0518  PROCALCITON <0.10 0.10 0.13    Recent Results (from the past 240 hour(s))  SARS CORONAVIRUS 2 (TAT 6-24 HRS) Nasopharyngeal Nasopharyngeal Swab     Status: None   Collection Time: 11/15/19  3:21 PM   Specimen: Nasopharyngeal Swab  Result Value Ref Range Status   SARS Coronavirus 2 NEGATIVE NEGATIVE Final    Comment: (NOTE) SARS-CoV-2 target nucleic acids are NOT DETECTED. The SARS-CoV-2 RNA is generally detectable in upper and lower respiratory specimens during the acute phase of infection. Negative results do not preclude SARS-CoV-2 infection, do not rule out co-infections  with other pathogens, and should not be used as the sole basis for treatment or other patient management decisions. Negative results must be combined with clinical observations, patient history, and epidemiological information. The expected result is Negative. Fact Sheet for Patients: SugarRoll.be Fact Sheet for Healthcare Providers: https://www.woods-mathews.com/ This test is not yet approved or cleared by the [place] FDA and  has been authorized for detection and/or diagnosis of SARS-CoV-2 by FDA under an  Emergency Use Authorization (EUA). This EUA will remain  in effect (meaning this test can be used) for the duration of the COVID-19 declaration under Section 56 4(b)(1) of the Act, 21 U.S.C. section 360bbb-3(b)(1), unless the authorization is terminated or revoked sooner. Performed at [facility], [place] [address], [place], [place] [address]   Urine Culture     Status: Abnormal   Collection Time: 11/15/19  5:17 PM   Specimen: Urine, Clean Catch  Result Value Ref Range Status   Specimen Description   Final    URINE, CLEAN CATCH Performed at [facility], [address] [place], [place] [address]    Special Requests   Final    Normal Performed at [facility], [address] [place], [place] [address]    Culture >=100,000 COLONIES/mL STAPHYLOCOCCUS LUGDUNENSIS (A)  Final   Report Status 11/18/2019 FINAL  Final   Organism ID, Bacteria STAPHYLOCOCCUS LUGDUNENSIS (A)  Final      Susceptibility   Staphylococcus lugdunensis - MIC*    CIPROFLOXACIN <=0.5 SENSITIVE Sensitive     GENTAMICIN <=0.5 SENSITIVE Sensitive     NITROFURANTOIN <=16 SENSITIVE Sensitive     OXACILLIN >=4 RESISTANT Resistant     TETRACYCLINE <=1 SENSITIVE Sensitive     VANCOMYCIN <=0.5 SENSITIVE Sensitive     TRIMETH/SULFA <=10 SENSITIVE Sensitive     CLINDAMYCIN <=0.25 SENSITIVE Sensitive     RIFAMPIN <=0.5 SENSITIVE Sensitive     Inducible Clindamycin NEGATIVE Sensitive     * >=100,000 COLONIES/mL STAPHYLOCOCCUS LUGDUNENSIS         Radiology Studies: No results found.      Scheduled Meds: . amitriptyline  10 mg Oral QHS  . amLODipine  5 mg Oral Daily  . aspirin EC  81 mg Oral Daily  . clonazePAM  0.5 mg Oral QHS  . cloNIDine  0.2 mg Oral TID  . divalproex  125 mg Oral BID  . donepezil  10 mg Oral QHS  . doxycycline  100 mg Oral BID  . enoxaparin (LOVENOX) injection  40 mg Subcutaneous Q24H  . levothyroxine  50 mcg Oral QAC breakfast  . loratadine  10 mg Oral Daily   . metoprolol succinate  50 mg Oral Daily  . pantoprazole  40 mg Oral Daily  . psyllium  1 packet Oral Daily   Continuous Infusions: . sodium chloride 50 mL/hr at 11/21/19 1536     LOS: 7 days    Time spent: 20 minutes    Ezekiel Slocumb, DO [facility] Pager: [phone]  If 7PM-7AM, please contact night-coverage [email or web address] Password [facility] 11/22/2019, 7:46 AM

## 2019-11-23 LAB — SARS CORONAVIRUS 2 (TAT 6-24 HRS): SARS Coronavirus 2: NEGATIVE

## 2019-11-23 NOTE — Care Management Important Message (Signed)
Important Message  Patient Details  Name: Valerie Hall MRN: [id number] Date of Birth: 12-23-1950   Medicare Important Message Given:  Yes     Valerie Hall 11/23/2019, 11:45 AM

## 2019-11-23 NOTE — TOC Progression Note (Signed)
Transition of Care [facility]) - Progression Note    Patient Details  Name: Valerie Hall MRN: [id number] Date of Birth: 06/22/1951  Transition of Care [facility]) CM/SW [place], RN Phone Number: 11/23/2019, 9:00 AM  Clinical Narrative:     Requested insurance auth status update from [place] at [facility], awaiting responce  Expected Discharge Plan: [place] Barriers to Discharge: Continued Medical Work up  Expected Discharge Plan and Services Expected Discharge Plan: [place]   Discharge Planning Services: CM Consult   Living arrangements for the past 2 months: Single Family Home                                       Social Determinants of Health (SDOH) Interventions    Readmission Risk Interventions No flowsheet data found.

## 2019-11-23 NOTE — Progress Notes (Signed)
Physical Therapy Treatment Patient Details Name: Valerie Hall MRN: [id number] DOB: [birth year] Today's Date: 11/23/2019    History of Present Illness Pt is a 68 y.o. female presenting to hospital 11/15/19 s/p multiple falls (with R knee pain) and increased weakness past 2 weeks.  Pt noted to be aphasic, slurred speech, and asymmetry of face; also c/o abdominal pain.  Pt admitted with acute metabolic encephalopathy with slurred speech, acute on chronic kidney disease, and PNA.  Imaging showing nondisplaced fx proximal fibula, medial malleolar fx, and suspected subtle fx through the fibular metaphsyis.  CT of head and MRI of brain negative for acute intracranial process.  Pt with unresponsive episode 11/30 early AM with fixed pupils and gaze.  L-spine imaging showing remote L1 superior endplate compression fx with vertebral body height loss about 25% (unchanged).  PMH includes depression, chronic pain, dementia, htn, Menieres disease, Crohn's diesase.    PT Comments    Pt resting in bed upon PT arrival and pt reporting being finished with breakfast and it was the most she has been able to eat recently.  Pt able to perform a few LE ex's but limited d/t pt reporting nausea.  Once nausea resolved pt agreeable to out of bed mobility.  Pt required 1 assist with transfers and ambulation 3 feet with RW; limited ambulation d/t increasing nausea with activity (resolved with sitting rest in recliner).  Pt reporting 7/10 R ankle pain beginning/end of session (pt reports receiving pain meds prior to session).  Will continue to focus on strengthening and progressive ambulation per pt tolerance.    Follow Up Recommendations  SNF     Equipment Recommendations  Rolling walker with 5" wheels;3in1 (PT);Wheelchair (measurements PT);Wheelchair cushion (measurements PT)    Recommendations for Other Services OT consult     Precautions / Restrictions Precautions Precautions: Fall Precaution Comments:  aspiration Required Braces or Orthoses: Other Brace Other Brace: R ankle in splint Restrictions Weight Bearing Restrictions: Yes RLE Weight Bearing: Non weight bearing    Mobility  Bed Mobility Overal bed mobility: Needs Assistance Bed Mobility: Supine to Sit     Supine to sit: Supervision;HOB elevated     General bed mobility comments: mild increased time to perform on own; use of bed rail; no vc's required  Transfers Overall transfer level: Needs assistance Equipment used: Rolling walker (2 wheeled) Transfers: Sit to/from [facility] Sit to Stand: Min assist Stand pivot transfers: Min assist       General transfer comment: vc's for UE/LE placement for transfers; assist to initiate stand up to walker and control descent sitting down in recliner; assist to steady transferring bed to recliner with walker  Ambulation/Gait Ambulation/Gait assistance: Min assist Gait Distance (Feet): 3 Feet Assistive device: Rolling walker (2 wheeled)   Gait velocity: decreased   General Gait Details: vc's for increasing UE support through RW to advance L LE; able to perform [place] R LE well; limited distance d/t nausea   Stairs             Wheelchair Mobility    Modified Rankin (Stroke Patients Only)       Balance Overall balance assessment: Needs assistance Sitting-balance support: No upper extremity supported;Feet unsupported Sitting balance-Leahy Scale: Good Sitting balance - Comments: steady sitting reaching within BOS   Standing balance support: Bilateral upper extremity supported Standing balance-Leahy Scale: Fair Standing balance comment: requires B UE support on walker for transfers and ambulation  Cognition Arousal/Alertness: Awake/alert Behavior During Therapy: WFL for tasks assessed/performed Overall Cognitive Status: Within Functional Limits for tasks assessed                                         Exercises Total Joint Exercises Ankle Circles/Pumps: AROM;Strengthening;Left;10 reps;Supine Heel Slides: Strengthening;Both;Supine(x10 reps AROM L; x5 reps AAROM R)    General Comments General comments (skin integrity, edema, etc.): R LE ankle splint in place.  Nursing cleared pt for participation in physical therapy.  Pt agreeable to PT session.      Pertinent Vitals/Pain Pain Assessment: 0-10 Pain Score: 7  Pain Location: Right ankle Pain Descriptors / Indicators: Constant;Sore Pain Intervention(s): Limited activity within patient's tolerance;Monitored during session;Repositioned;Premedicated before session    Home Living                      Prior Function            PT Goals (current goals can now be found in the care plan section) Acute Rehab PT Goals Patient Stated Goal: to improve mobility PT Goal Formulation: With patient Time For Goal Achievement: 12/01/19 Potential to Achieve Goals: Good Progress towards PT goals: Progressing toward goals    Frequency    7X/week      PT Plan Current plan remains appropriate    Co-evaluation              AM-PAC PT "6 Clicks" Mobility   Outcome Measure  Help needed turning from your back to your side while in a flat bed without using bedrails?: None Help needed moving from lying on your back to sitting on the side of a flat bed without using bedrails?: A Little Help needed moving to and from a bed to a chair (including a wheelchair)?: A Little Help needed standing up from a chair using your arms (e.g., wheelchair or bedside chair)?: A Little Help needed to walk in hospital room?: A Lot Help needed climbing 3-5 steps with a railing? : Total 6 Click Score: 16    End of Session Equipment Utilized During Treatment: Gait belt Activity Tolerance: Other (comment)(Limited d/t nausea) Patient left: in chair;with call bell/phone within reach;with chair alarm set;Other (comment)(B LE's elevated via  pillows with heels floating) Nurse Communication: Mobility status;Precautions;Weight bearing status(via white board) PT Visit Diagnosis: Other abnormalities of gait and mobility (R26.89);Muscle weakness (generalized) (M62.81);Repeated falls (R29.6);History of falling (Z91.81);Difficulty in walking, not elsewhere classified (R26.2);Pain Pain - Right/Left: Right Pain - part of body: Ankle and joints of foot     Time: 0300-9233 PT Time Calculation (min) (ACUTE ONLY): 23 min  Charges:  $Therapeutic Exercise: 8-22 mins $Therapeutic Activity: 8-22 mins                     Leitha Bleak, PT 11/23/19, 10:42 AM

## 2019-11-23 NOTE — Progress Notes (Signed)
PROGRESS NOTE    Valerie Hall  RJJ:[id number] DOB: 1950/12/31 DOA: 11/15/2019  PCP: Idelle Crouch, MD    LOS - 8   Brief Narrative:  68 y.o.femalewith a known history ofhypertension, Mnire's disease, dementia presents to the hospital withrecurrentfallsover previous week or more.Patient reportedly also dropping objects, had been confused, per family, and then had some slurred speech and facial droop, which prompted coming to ED for evaluation. In the ED, CT head negative. CT of cervical spine showed possible RUL pneumonia. Xray of RLE showed proximal fibula fracture. Patient admitted for further evaluation and management. Evaluation for possible stroke included MRI brain, carotid duplex and echocardiogram which were unrevealing. Neurology consulted. Suspect falls and apparent neurologic symptoms could be secondary to polypharmacy, medications reviewed and adjustments made including gabapentin held, decreased TCA dose, stopped Remeron and decreased Klonopin. Ortho consulted for fibula fracture. RLE to remain in split, non-weightbearingon RLE and follow up with Dr. Mack Guise 1 week after discharge.   Subjective 12/7: Patient seen, up in chair, son at bedside.  No acute events reported.  She reported feeling a bit better today.  Was able to eat some toast.  Nausea improved today.  No other complaints.  Assessment & Plan:   Active Problems:   Hypothyroidism   Slurred speech   Acute metabolic encephalopathy   Essential hypertension   Hypotension   Acute cystitis without hematuria   Closed right ankle fracture, with routine healing, subsequent encounter   Alzheimer's dementia without behavioral disturbance ([place])   AKI (acute kidney injury) ([place])   Acute urinary retention   Poor PO Intake secondary to nausea --dietician consulted  --continue PRN antiemetic  Acute metabolic encephalopathy-resolved Slurred speech Recurrent falls -Evaluation for stroke  including MRI brain negative -Continue to hold gabapentin -Continue reduced dose of amitriptyline -Will resume Remeron as patient has had poor appetite. Monitor mental status with this. -Decreased home Klonopin -Treating for infection as below -PT OT -to SNF for rehab, social work on board, [employer] streamer pending  Acute cystitiswithout hematuria Possible pneumonia, based on imaging -urine culture grew staph lugdunensis, oxacillin resistant, otherwise pan-sensitive -continuedoxycycline,to finish course 12/7  Right ankle and proximal fibula fractures -Tylenoland Ultramas needed for pain -Ortho consulted, Dr. Theone Murdoch right lower extremity to remain in splint, nonweightbearing on the right lower extremity,follow up in clinic 7-10 afterdischarge.  Acute kidney injury superimposed on CKD stage IIIa  Urinary retention -AKIresolvedwith hydration -bladder scansif not voiding -In and out cath as needed for urinary retention  Hypotension History of hypertension BP meds initially heldfor hypotension, this is resolved and medications resumed. Patient tends to have mildly elevated systolic pressures but low diastolic. -Resume [id number] mg daily  -Resume clonidine 0.2 mg TID -Added Norvasc 5 mg daily -labetalol IV PRN  Hypothyroidism -Continue home levothyroxine  Insomnia Anxiety/depression -Reduced doses of amitriptyline and Klonopin as above  Alzheimer's dementia,without behavioral disturbance -Continue home Aricept    DVT prophylaxis:Lovenox Code Status: Full Code Family Communication: son at bedside Disposition Plan:discharge to SNF for rehab pending insurance authorization  Consultants:  Orthopedics  Neurology   Antimicrobials:  Rocephin and azithromycin- stop 12/3  Doxycycline - start 12/3, planned stop 12/7  Objective: Vitals:   11/22/19 1622 11/22/19 2326 11/23/19 0833 11/23/19 1544  BP: (!) 145/62 (!)  149/60 (!) 166/67 (!) 168/73  Pulse: 70 65 67 68  Resp: 18 20 17 17   Temp: 98.9 F (37.2 C) 98 F (36.7 C) 98.5 F (36.9 C) 98.2 F (36.8 C)  TempSrc: Oral  Oral Oral Oral  SpO2: 99% 96% 98% 97%  Weight:      Height:        Intake/Output Summary (Last 24 hours) at 11/23/2019 1644 Last data filed at 11/23/2019 1300 Gross per 24 hour  Intake 120 ml  Output -  Net 120 ml   Filed Weights   11/15/19 1108  Weight: 79.4 kg    Examination:  General exam: awake, alert, no acute distress Respiratory system: normal respiratory effort, chest rise equal bilaterally Cardiovascular system: regular rate and rhythm, 2+ radial pulses Gastrointestinal system: soft, non-tender, non-distended abdomen Extremities: right lower extremity dressing and splint intact, no edema, normal tone   Data Reviewed: I have personally reviewed following labs and imaging studies  CBC: Recent Labs  Lab 11/17/19 0518 11/19/19 0349  WBC 14.1* 7.8  HGB 11.0* 10.1*  HCT 35.1* 31.9*  MCV 101.4* 99.7  PLT 166 937   Basic Metabolic Panel: Recent Labs  Lab 11/17/19 0518 11/18/19 0427 11/19/19 0349 11/22/19 0453  NA 139 140 143  --   K 3.7 3.7 4.2  --   CL 112* 111 115*  --   CO2 19* 21* 20*  --   GLUCOSE 106* 102* 99  --   BUN 25* 24* 17  --   CREATININE 1.35* 1.30* 0.87 0.80  CALCIUM 7.3* 7.1* 7.3*  --   MG  --   --  2.3  --    GFR: Estimated Creatinine Clearance: 69.5 mL/min (by C-G formula based on SCr of 0.8 mg/dL). Liver Function Tests: No results for input(s): AST, ALT, ALKPHOS, BILITOT, PROT, ALBUMIN in the last 168 hours. No results for input(s): LIPASE, AMYLASE in the last 168 hours. No results for input(s): AMMONIA in the last 168 hours. Coagulation Profile: No results for input(s): INR, PROTIME in the last 168 hours. Cardiac Enzymes: No results for input(s): CKTOTAL, CKMB, CKMBINDEX, TROPONINI in the last 168 hours. BNP (last 3 results) No results for input(s): PROBNP in the last  8760 hours. HbA1C: No results for input(s): HGBA1C in the last 72 hours. CBG: No results for input(s): GLUCAP in the last 168 hours. Lipid Profile: No results for input(s): CHOL, HDL, LDLCALC, TRIG, CHOLHDL, LDLDIRECT in the last 72 hours. Thyroid Function Tests: No results for input(s): TSH, T4TOTAL, FREET4, T3FREE, THYROIDAB in the last 72 hours. Anemia Panel: No results for input(s): VITAMINB12, FOLATE, FERRITIN, TIBC, IRON, RETICCTPCT in the last 72 hours. Sepsis Labs: Recent Labs  Lab 11/17/19 0518  PROCALCITON 0.13    Recent Results (from the past 240 hour(s))  SARS CORONAVIRUS 2 (TAT 6-24 HRS) Nasopharyngeal Nasopharyngeal Swab     Status: None   Collection Time: 11/15/19  3:21 PM   Specimen: Nasopharyngeal Swab  Result Value Ref Range Status   SARS Coronavirus 2 NEGATIVE NEGATIVE Final    Comment: (NOTE) SARS-CoV-2 target nucleic acids are NOT DETECTED. The SARS-CoV-2 RNA is generally detectable in upper and lower respiratory specimens during the acute phase of infection. Negative results do not preclude SARS-CoV-2 infection, do not rule out co-infections with other pathogens, and should not be used as the sole basis for treatment or other patient management decisions. Negative results must be combined with clinical observations, patient history, and epidemiological information. The expected result is Negative. Fact Sheet for Patients: SugarRoll.be Fact Sheet for Healthcare Providers: https://www.woods-mathews.com/ This test is not yet approved or cleared by the [place] FDA and  has been authorized for detection and/or diagnosis of SARS-CoV-2 by FDA under  an Emergency Use Authorization (EUA). This EUA will remain  in effect (meaning this test can be used) for the duration of the COVID-19 declaration under Section 56 4(b)(1) of the Act, 21 U.S.C. section 360bbb-3(b)(1), unless the authorization is terminated or revoked  sooner. Performed at [facility], [place] [address] [place], [place] [address]   Urine Culture     Status: Abnormal   Collection Time: 11/15/19  5:17 PM   Specimen: Urine, Clean Catch  Result Value Ref Range Status   Specimen Description   Final    URINE, CLEAN CATCH Performed at [facility], [address], [place], [place] [address]    Special Requests   Final    Normal Performed at [facility], [address] [place], [place] [address]    Culture >=100,000 COLONIES/mL STAPHYLOCOCCUS LUGDUNENSIS (A)  Final   Report Status 11/18/2019 FINAL  Final   Organism ID, Bacteria STAPHYLOCOCCUS LUGDUNENSIS (A)  Final      Susceptibility   Staphylococcus lugdunensis - MIC*    CIPROFLOXACIN <=0.5 SENSITIVE Sensitive     GENTAMICIN <=0.5 SENSITIVE Sensitive     NITROFURANTOIN <=16 SENSITIVE Sensitive     OXACILLIN >=4 RESISTANT Resistant     TETRACYCLINE <=1 SENSITIVE Sensitive     VANCOMYCIN <=0.5 SENSITIVE Sensitive     TRIMETH/SULFA <=10 SENSITIVE Sensitive     CLINDAMYCIN <=0.25 SENSITIVE Sensitive     RIFAMPIN <=0.5 SENSITIVE Sensitive     Inducible Clindamycin NEGATIVE Sensitive     * >=100,000 COLONIES/mL STAPHYLOCOCCUS LUGDUNENSIS         Radiology Studies: No results found.      Scheduled Meds: . amitriptyline  10 mg Oral QHS  . amLODipine  5 mg Oral Daily  . aspirin EC  81 mg Oral Daily  . clonazePAM  0.5 mg Oral QHS  . cloNIDine  0.2 mg Oral TID  . divalproex  125 mg Oral BID  . donepezil  10 mg Oral QHS  . doxycycline  100 mg Oral BID  . enoxaparin (LOVENOX) injection  40 mg Subcutaneous Q24H  . levothyroxine  50 mcg Oral QAC breakfast  . loratadine  10 mg Oral Daily  . metoprolol succinate  50 mg Oral Daily  . mirtazapine  30 mg Oral QHS  . pantoprazole  40 mg Oral Daily  . psyllium  1 packet Oral Daily   Continuous Infusions: . sodium chloride 50 mL/hr at 11/22/19 1142     LOS: 8 days    Time spent: 15-20 minutes     Ezekiel Slocumb, DO [facility] Pager: [phone]  If 7PM-7AM, please contact night-coverage [email or web address] Password [email or web address] 11/23/2019, 4:44 PM

## 2019-11-24 LAB — VITAMIN D 25 HYDROXY (VIT D DEFICIENCY, FRACTURES): Vit D, 25-Hydroxy: 42.73 ng/mL (ref 30–100)

## 2019-11-24 LAB — FOLATE: Folate: 16.7 ng/mL (ref >5.9)

## 2019-11-24 LAB — VITAMIN B12: Vitamin B-12: 345 pg/mL (ref 180–914)

## 2019-11-24 MED ORDER — ONDANSETRON HCL 4 MG PO TABS
8.0000 mg | ORAL_TABLET | Freq: Three times a day (TID) | ORAL | Status: DC
  Administered 2019-11-24 – 2019-11-25 (×3): 8 mg via ORAL
  Filled 2019-11-24 (×3): qty 2

## 2019-11-24 MED ORDER — AMLODIPINE BESYLATE 10 MG PO TABS
10.0000 mg | ORAL_TABLET | Freq: Every day | ORAL | Status: DC
  Administered 2019-11-24 – 2019-11-25 (×2): 10 mg via ORAL
  Filled 2019-11-24 (×2): qty 1

## 2019-11-24 MED ORDER — ENSURE ENLIVE PO LIQD: 237.0000 mL | Freq: Three times a day (TID) | ORAL | Status: DC

## 2019-11-24 MED ORDER — ADULT MULTIVITAMIN W/MINERALS CH
1.0000 | ORAL_TABLET | Freq: Every day | ORAL | Status: DC
  Administered 2019-11-25: 09:00:00 1 via ORAL
  Filled 2019-11-24: qty 1

## 2019-11-24 MED ORDER — VITAMIN C 500 MG PO TABS
250.0000 mg | ORAL_TABLET | Freq: Two times a day (BID) | ORAL | Status: DC
  Administered 2019-11-25: 09:00:00 250 mg via ORAL
  Filled 2019-11-24: qty 1

## 2019-11-24 NOTE — TOC Progression Note (Signed)
Transition of Care [facility]) - Progression Note    Patient Details  Name: Valerie Hall MRN: [id number] Date of Birth: October 14, 1951  Transition of Care [facility]) CM/SW [place], RN Phone Number: 11/24/2019, 2:39 PM  Clinical Narrative:     Sid Falcon with [facility] and requested a status for the insurance auth, awating a call back  Expected Discharge Plan: [place] Barriers to Discharge: Continued Medical Work up  Expected Discharge Plan and Services Expected Discharge Plan: [place]   Discharge Planning Services: CM Consult   Living arrangements for the past 2 months: Single Family Home                                       Social Determinants of Health (SDOH) Interventions    Readmission Risk Interventions No flowsheet data found.

## 2019-11-24 NOTE — Progress Notes (Signed)
Occupational Therapy Treatment Patient Details Name: Valerie Hall MRN: [id number] DOB: [birth year] Today's Date: 11/24/2019    History of present illness Pt is a 68 y.o. female presenting to hospital 11/15/19 s/p multiple falls (with R knee pain) and increased weakness past 2 weeks.  Pt noted to be aphasic, slurred speech, and asymmetry of face; also c/o abdominal pain.  Pt admitted with acute metabolic encephalopathy with slurred speech, acute on chronic kidney disease, and PNA.  Imaging showing nondisplaced fx proximal fibula, medial malleolar fx, and suspected subtle fx through the fibular metaphsyis.  CT of head and MRI of brain negative for acute intracranial process.  Pt with unresponsive episode 11/30 early AM with fixed pupils and gaze.  L-spine imaging showing remote L1 superior endplate compression fx with vertebral body height loss about 25% (unchanged).  PMH includes depression, chronic pain, dementia, htn, Menieres disease, Crohn's diesase.   OT comments  Pt seen for OT tx this date to f/u re: safety with self care ADLs and ADL mobility. OT facilitates pt t/f from EOB to recliner adjacent to bed with MIN A with RW. Pt tolerates well. Does c/o constant pain in R LE, but does not become worsened with transfer. Pt seated up in chair participates in grooming with setup and UB bathing-to prep for washing hair with MIN/MOD A. Overall, as pt is still requiring assist for transfers and self care, SNF remains most prudent d/c dispo.    Follow Up Recommendations  SNF;Supervision/Assistance - 24 hour    Equipment Recommendations  Other (comment)(defer to next venue of care.)    Recommendations for Other Services      Precautions / Restrictions Precautions Precautions: Fall Precaution Comments: aspiration Required Braces or Orthoses: Other Brace Other Brace: R ankle in splint Restrictions Weight Bearing Restrictions: Yes RLE Weight Bearing: Non weight bearing       Mobility Bed  Mobility Overal bed mobility: Needs Assistance Bed Mobility: Supine to Sit     Supine to sit: Supervision;HOB elevated     General bed mobility comments: increased time required. Once seated, increased time and MIN verbal cueing for pt to scoot hips foward toward EOB using lateral lean weight shift technique.  Transfers Overall transfer level: Needs assistance Equipment used: Rolling walker (2 wheeled) Transfers: Sit to/from [facility] Sit to Stand: Min assist Stand pivot transfers: Min assist       General transfer comment: Pt in low bed, OT does elevate to appropriate chair height in prep for t/f. Pt tolerates well, MIN verbal cues for safe hand placement relative to RW.    Balance Overall balance assessment: Needs assistance Sitting-balance support: No upper extremity supported;Feet unsupported Sitting balance-Leahy Scale: Good Sitting balance - Comments: G static sitting balance   Standing balance support: Bilateral upper extremity supported Standing balance-Leahy Scale: Fair Standing balance comment: requires BUE support through RW and MIN A for safe static standing balance.                           ADL either performed or assessed with clinical judgement   ADL       Grooming: Wash/dry face;Set up;Sitting   Upper Body Bathing: Minimal assistance;Moderate assistance Upper Body Bathing Details (indicate cue type and reason): Pt does not feel she can particiapte in washing hair at this time, citing nausea, but pt participates with OT in applying oily solution to sticky residue that is in her hair in prep for washing  when she is ready. OT places shower cap in warmer for pt and notifies CNA.         Lower Body Dressing: Moderate assistance;Sitting/lateral leans Lower Body Dressing Details (indicate cue type and reason): Pt able to don sock to her L LE while seated EOB, Unable to attempt cross-leg sitting with R LE.                      Vision Patient Visual Report: No change from baseline     Perception     Praxis      Cognition Arousal/Alertness: Awake/alert Behavior During Therapy: WFL for tasks assessed/performed Overall Cognitive Status: Within Functional Limits for tasks assessed                                 General Comments: increased processing time still present. Pt appropriate with command following.        Exercises Other Exercises Other Exercises: OT facilitates education with pt re: d/c planning including need for rehab as pt describes not having much help at home. Pt agreeable to rehab, asks appropriate questions during education. Other Exercises: OT facilitates education re: importance of OOB activity including prevention of opportunistic infections and prevention of skin breakdown. Pt verbalized understanding. Moderate reception of education and pt becomes agreeable to chair t/f.   Shoulder Instructions       General Comments      Pertinent Vitals/ Pain       Pain Assessment: 0-10 Pain Score: 7  Pain Location: Right ankle Pain Descriptors / Indicators: Constant;Sore Pain Intervention(s): Limited activity within patient's tolerance;Monitored during session;Repositioned  Home Living                                          Prior Functioning/Environment              Frequency  Min 2X/week        Progress Toward Goals  OT Goals(current goals can now be found in the care plan section)  Progress towards OT goals: Progressing toward goals  Acute Rehab OT Goals Patient Stated Goal: to improve mobility OT Goal Formulation: With patient Time For Goal Achievement: 12/02/19 Potential to Achieve Goals: Good  Plan Discharge plan remains appropriate;Frequency remains appropriate    Co-evaluation                 AM-PAC OT "6 Clicks" Daily Activity     Outcome Measure   Help from another person eating meals?: None Help from another  person taking care of personal grooming?: A Little Help from another person toileting, which includes using toliet, bedpan, or urinal?: A Lot Help from another person bathing (including washing, rinsing, drying)?: A Lot Help from another person to put on and taking off regular upper body clothing?: A Little Help from another person to put on and taking off regular lower body clothing?: A Lot 6 Click Score: 16    End of Session Equipment Utilized During Treatment: Gait belt;Rolling walker  OT Visit Diagnosis: Unsteadiness on feet (R26.81);History of falling (Z91.81);Muscle weakness (generalized) (M62.81)   Activity Tolerance Patient tolerated treatment well   Patient Left in chair;with call bell/phone within reach;with chair alarm set   Nurse Communication Other (comment)(notified CNA that pt up to chair with chair alarm. Notified that OT  assisted pt in applying oily solution to sticky residue in patient's hair. Notified that OT placed hair washing cap in warmer for pt, but pt doesn't feel she can tolerate at this time.)        Time: 1045-1109 OT Time Calculation (min): 24 min  Charges: OT General Charges $OT Visit: 1 Visit OT Treatments $Self Care/Home Management : 8-22 mins $Therapeutic Activity: 8-22 mins    Gerrianne Scale, [place], OTR/L ascom [phone] 11/24/19, 11:31 AM

## 2019-11-24 NOTE — Progress Notes (Signed)
PT Cancellation Note  Patient Details Name: Valerie Hall MRN: [id number] DOB: [birth year]   Cancelled Treatment:    Reason Eval/Treat Not Completed: Other (comment)   Pt declined session on first attempt this am due to nausea.  Returned later in am and while pt was up in the recliner she continued to decline session due to nausea.  Encouraged exercises but she remained firm.   Chesley Noon 11/24/2019, 11:13 AM

## 2019-11-24 NOTE — Progress Notes (Addendum)
PROGRESS NOTE    Valerie Hall  WJX:[id number] DOB: 1951/11/11 DOA: 11/15/2019  PCP: Idelle Crouch, MD    LOS - 9   Brief Narrative:  68 y.o.femalewith a known history ofhypertension, Mnire's disease, dementia presents to the hospital withrecurrentfallsover previous week or more.Patient reportedly also dropping objects, had been confused, per family, and then had some slurred speech and facial droop, which prompted coming to ED for evaluation. In the ED, CT head negative. CT of cervical spine showed possible RUL pneumonia. Xray of RLE showed proximal fibula fracture. Patient admitted for further evaluation and management. Evaluation for possible stroke included MRI brain, carotid duplex and echocardiogram which were unrevealing. Neurology consulted. Suspect falls and apparent neurologic symptoms could be secondary to polypharmacy, medications reviewed and adjustments made including gabapentin held, decreased TCA dose, stopped Remeron and decreased Klonopin. Ortho consulted for fibula fracture. RLE to remain in split, non-weightbearingon RLE and follow up with Dr. Mack Guise 1 week after discharge.   Subjective 12/8: Patient laying in bed, awake.  Having more nausea again today, couldn't eat breakfast.  No vomiting.  No other complaints.  Couldn't get up with therapy due to not feeling well.  Assessment & Plan:   Active Problems:   Hypothyroidism   Slurred speech   Acute metabolic encephalopathy   Essential hypertension   Hypotension   Acute cystitis without hematuria   Closed right ankle fracture, with routine healing, subsequent encounter   Alzheimer's dementia without behavioral disturbance (HCC)   AKI (acute kidney injury) ([place])   Acute urinary retention   Poor PO Intake secondary to nausea --dietician consulted  --continue PRN antiemetic  Acute metabolic encephalopathy-resolved Slurred speech Recurrent falls -Evaluation for stroke negative  -Continue to hold gabapentin -Continue reduced dose of amitriptyline -ResumedRemeron to hopefully stimulate appetite, well tolerated -Decreased home Klonopin -Treated for infection as below -PT OT -to SNF for rehab, social work on board, [employer] pending  Acute cystitiswithout hematuria Possible pneumonia, based on imaging -urine culture grew staph lugdunensis, oxacillin resistant, otherwise pan-sensitive -finished antibiotics  Right ankle and proximal fibula fractures -Tylenoland Ultramas needed for pain -Ortho consulted, Dr. Theone Murdoch right lower extremity to remain in splint, nonweightbearing on the right lower extremity,follow up in clinic 7-10 days afterdischarge.   Acute kidney injury superimposed on CKD stage IIIa  Acute Urinary retention - resolved -AKIresolvedwith hydration -bladder scansif not voiding -In and out cath as needed for urinary retention  Hypotension - resolved History of hypertension BP meds initially heldfor hypotension, this is resolved and medications resumed. Patient tends to have mildly elevated systolic pressures but low diastolic. -Resume [id number] mg daily  -Resume clonidine 0.2 mg TID -Added Norvasc 5 mg daily -labetalol IV PRN  Hypothyroidism -Continue home levothyroxine  Insomnia Anxiety/depression -Reduced doses of amitriptyline and Klonopin as above  Alzheimer's dementia,without behavioral disturbance -Continue home Aricept    DVT prophylaxis:Lovenox Code Status: Full Code Family Communication: none at bedside Disposition Plan:discharge to SNF for rehab pending insurance authorization  Consultants:  Orthopedics  Neurology   Antimicrobials:  Rocephin and azithromycin- stop 12/3  Doxycycline - start 12/3, planned stop 12/7    Objective: Vitals:   11/23/19 1544 11/24/19 0014 11/24/19 0807 11/24/19 1510  BP: (!) 168/73 (!) 155/71 (!) 152/63 132/61  Pulse: 68 63  (!) 57 63  Resp: 17 19 17 17   Temp: 98.2 F (36.8 C) 97.6 F (36.4 C) 98.1 F (36.7 C) 98.2 F (36.8 C)  TempSrc: Oral Oral Oral Oral  SpO2: 97% 96%  99% 98%  Weight:      Height:        Intake/Output Summary (Last 24 hours) at 11/24/2019 1958 Last data filed at 11/23/2019 2025 Gross per 24 hour  Intake 60 ml  Output -  Net 60 ml   Filed Weights   11/15/19 1108  Weight: 79.4 kg    Examination:  General exam: awake, alert, no acute distress Respiratory system: clear to auscultation bilaterally,normal respiratory effort. Cardiovascular system: normal S1/S2, RRR, no JVD, murmurs, rubs, gallops, no pedal edema.   Extremities: RLE dressing & splintintact, no edema   Data Reviewed: I have personally reviewed following labs and imaging studies  CBC: Recent Labs  Lab 11/19/19 0349  WBC 7.8  HGB 10.1*  HCT 31.9*  MCV 99.7  PLT 326   Basic Metabolic Panel: Recent Labs  Lab 11/18/19 0427 11/19/19 0349 11/22/19 0453  NA 140 143  --   K 3.7 4.2  --   CL 111 115*  --   CO2 21* 20*  --   GLUCOSE 102* 99  --   BUN 24* 17  --   CREATININE 1.30* 0.87 0.80  CALCIUM 7.1* 7.3*  --   MG  --  2.3  --    GFR: Estimated Creatinine Clearance: 69.5 mL/min (by C-G formula based on SCr of 0.8 mg/dL). Liver Function Tests: No results for input(s): AST, ALT, ALKPHOS, BILITOT, PROT, ALBUMIN in the last 168 hours. No results for input(s): LIPASE, AMYLASE in the last 168 hours. No results for input(s): AMMONIA in the last 168 hours. Coagulation Profile: No results for input(s): INR, PROTIME in the last 168 hours. Cardiac Enzymes: No results for input(s): CKTOTAL, CKMB, CKMBINDEX, TROPONINI in the last 168 hours. BNP (last 3 results) No results for input(s): PROBNP in the last 8760 hours. HbA1C: No results for input(s): HGBA1C in the last 72 hours. CBG: No results for input(s): GLUCAP in the last 168 hours. Lipid Profile: No results for input(s): CHOL, HDL, LDLCALC, TRIG,  CHOLHDL, LDLDIRECT in the last 72 hours. Thyroid Function Tests: No results for input(s): TSH, T4TOTAL, FREET4, T3FREE, THYROIDAB in the last 72 hours. Anemia Panel: Recent Labs    11/24/19 1429  FOLATE 16.7   Sepsis Labs: No results for input(s): PROCALCITON, LATICACIDVEN in the last 168 hours.  Recent Results (from the past 240 hour(s))  SARS CORONAVIRUS 2 (TAT 6-24 HRS) Nasopharyngeal Nasopharyngeal Swab     Status: None   Collection Time: 11/15/19  3:21 PM   Specimen: Nasopharyngeal Swab  Result Value Ref Range Status   SARS Coronavirus 2 NEGATIVE NEGATIVE Final    Comment: (NOTE) SARS-CoV-2 target nucleic acids are NOT DETECTED. The SARS-CoV-2 RNA is generally detectable in upper and lower respiratory specimens during the acute phase of infection. Negative results do not preclude SARS-CoV-2 infection, do not rule out co-infections with other pathogens, and should not be used as the sole basis for treatment or other patient management decisions. Negative results must be combined with clinical observations, patient history, and epidemiological information. The expected result is Negative. Fact Sheet for Patients: SugarRoll.be Fact Sheet for Healthcare Providers: https://www.woods-mathews.com/ This test is not yet approved or cleared by the [place] FDA and  has been authorized for detection and/or diagnosis of SARS-CoV-2 by FDA under an Emergency Use Authorization (EUA). This EUA will remain  in effect (meaning this test can be used) for the duration of the COVID-19 declaration under Section 56 4(b)(1) of the Act, 21 U.S.C. section 360bbb-3(b)(1),  unless the authorization is terminated or revoked sooner. Performed at [facility], [place] [address], [place], [place] [address]   Urine Culture     Status: Abnormal   Collection Time: 11/15/19  5:17 PM   Specimen: Urine, Clean Catch  Result Value Ref Range Status   Specimen  Description   Final    URINE, CLEAN CATCH Performed at [facility], [address] [place], [place] [address]    Special Requests   Final    Normal Performed at [facility], [address] [place], [place] [address]    Culture >=100,000 COLONIES/mL STAPHYLOCOCCUS LUGDUNENSIS (A)  Final   Report Status 11/18/2019 FINAL  Final   Organism ID, Bacteria STAPHYLOCOCCUS LUGDUNENSIS (A)  Final      Susceptibility   Staphylococcus lugdunensis - MIC*    CIPROFLOXACIN <=0.5 SENSITIVE Sensitive     GENTAMICIN <=0.5 SENSITIVE Sensitive     NITROFURANTOIN <=16 SENSITIVE Sensitive     OXACILLIN >=4 RESISTANT Resistant     TETRACYCLINE <=1 SENSITIVE Sensitive     VANCOMYCIN <=0.5 SENSITIVE Sensitive     TRIMETH/SULFA <=10 SENSITIVE Sensitive     CLINDAMYCIN <=0.25 SENSITIVE Sensitive     RIFAMPIN <=0.5 SENSITIVE Sensitive     Inducible Clindamycin NEGATIVE Sensitive     * >=100,000 COLONIES/mL STAPHYLOCOCCUS LUGDUNENSIS  SARS CORONAVIRUS 2 (TAT 6-24 HRS) Nasopharyngeal Nasopharyngeal Swab     Status: None   Collection Time: 11/23/19 10:23 AM   Specimen: Nasopharyngeal Swab  Result Value Ref Range Status   SARS Coronavirus 2 NEGATIVE NEGATIVE Final    Comment: (NOTE) SARS-CoV-2 target nucleic acids are NOT DETECTED. The SARS-CoV-2 RNA is generally detectable in upper and lower respiratory specimens during the acute phase of infection. Negative results do not preclude SARS-CoV-2 infection, do not rule out co-infections with other pathogens, and should not be used as the sole basis for treatment or other patient management decisions. Negative results must be combined with clinical observations, patient history, and epidemiological information. The expected result is Negative. Fact Sheet for Patients: SugarRoll.be Fact Sheet for Healthcare Providers: https://www.woods-mathews.com/ This test is not yet approved or cleared by the  [place] FDA and  has been authorized for detection and/or diagnosis of SARS-CoV-2 by FDA under an Emergency Use Authorization (EUA). This EUA will remain  in effect (meaning this test can be used) for the duration of the COVID-19 declaration under Section 56 4(b)(1) of the Act, 21 U.S.C. section 360bbb-3(b)(1), unless the authorization is terminated or revoked sooner. Performed at [facility], [address] [place], [place] [address]          Radiology Studies: No results found.      Scheduled Meds: . amitriptyline  10 mg Oral QHS  . amLODipine  10 mg Oral Daily  . aspirin EC  81 mg Oral Daily  . clonazePAM  0.5 mg Oral QHS  . cloNIDine  0.2 mg Oral TID  . divalproex  125 mg Oral BID  . donepezil  10 mg Oral QHS  . enoxaparin (LOVENOX) injection  40 mg Subcutaneous Q24H  . feeding supplement (ENSURE ENLIVE)  237 mL Oral TID BM  . levothyroxine  50 mcg Oral QAC breakfast  . loratadine  10 mg Oral Daily  . metoprolol succinate  50 mg Oral Daily  . mirtazapine  30 mg Oral QHS  . [START ON 11/25/2019] multivitamin with minerals  1 tablet Oral Daily  . ondansetron  8 mg Oral  TID AC  . pantoprazole  40 mg Oral Daily  . psyllium  1 packet Oral Daily  . [START ON 11/25/2019] vitamin C  250 mg Oral BID   Continuous Infusions: . sodium chloride 50 mL/hr at 11/22/19 1142     LOS: 9 days    Time spent: 15-20 minutes    Ezekiel Slocumb, DO [facility] Pager: [phone]  If 7PM-7AM, please contact night-coverage [email or web address] Password [facility] 11/24/2019, 7:58 PM

## 2019-11-24 NOTE — Progress Notes (Signed)
Initial Nutrition Assessment  DOCUMENTATION CODES:   Not applicable  INTERVENTION:   Ensure Enlive po TID, each supplement provides 350 kcal and 20 grams of protein  MVI daily   Vitamin C 285m po BID  Low fat diet   Recommend resume creon 72,000 units daily with meals  Recommend check B12, thiamine, B6, vitamin D, niacin, folate and copper labs to r/o deficiency   NUTRITION DIAGNOSIS:   Inadequate oral intake related to poor appetite as evidenced by per patient/family report.  GOAL:   Patient will meet greater than or equal to 90% of their needs  MONITOR:   PO intake, Supplement acceptance, Labs, Weight trends, Skin, I & O's  REASON FOR ASSESSMENT:   Consult Poor PO  ASSESSMENT:   68y.o. female with a known history of hypertension, Mnire's disease, dementia presents to the hospital with recurrent falls over previous week or more. Pt found to have possible PNA and right ankle and proximal fibula fractures   Met with pt in room today. Pt reports poor appetite and oral intake at baseline but reports her appetite and been severely decreased for several days. Pt eating only sips and bites in hospital. Pt reports gagging after eating as well as chronic diarrhea which she reports makes her not want to eat anything. Pt with h/o pancreatitic insufficieny diagnosed by GI per low fecal elastase stool test and fatty replacement of the pancreas on CT. Pt negative for celiac disease per biopsy and no mention of Crohn's diease per GI report. Pt does report a h/o IBS. Pt reports taking 72,000 units of creon with meals at home which patient reports does decrease her diarrhea. Pt also takes imodium. Pt does like vanilla supplements but reports that she can not afford these at home. Pt does not take daily multivitamin but reports h/o vitamin D and B12 deficiencies for which she had to take injections for. RD will add supplements and vitamins to help pt meet her estimated needs. RD will also  change patient to a low fat diet. Given pt's history of frequent falls and likely malabsorption, would recommend check vitamin labs to r/o deficiency.   Per chart, pt appears weight stable pta.   Medications reviewed and include: aspirin, lovenox, synthroid, remeron, protonix, psyllium, NaCl _0 /hr  Labs reviewed: Hgb 10.1(L), Hct 31.9(L)  NUTRITION - FOCUSED PHYSICAL EXAM:    Most Recent Value  Orbital Region  Moderate depletion  Upper Arm Region  No depletion  Thoracic and Lumbar Region  No depletion  Buccal Region  No depletion  Temple Region  Moderate depletion  Clavicle Bone Region  Mild depletion  Clavicle and Acromion Bone Region  Mild depletion  Scapular Bone Region  No depletion  Dorsal Hand  Moderate depletion  Patellar Region  No depletion  Anterior Thigh Region  No depletion  Posterior Calf Region  No depletion  Edema (RD Assessment)  None  Hair  Reviewed  Eyes  Reviewed  Mouth  Reviewed  Skin  Reviewed  Nails  Reviewed     Diet Order:   Diet Order            Diet Heart Room service appropriate? Yes with Assist; Fluid consistency: Thin  Diet effective now             EDUCATION NEEDS:   Education needs have been addressed  Skin:  Skin Assessment: Reviewed RN Assessment(ecchymosis)  Last BM:  12/7- type 7  Height:   Ht Readings from Last 1 Encounters:  11/15/19 _0  (1.676 m)    Weight:   Wt Readings from Last 1 Encounters:  11/15/19 79.4 kg    Ideal Body Weight:  59 kg  BMI:  Body mass index is 28.25 kg/m.  Estimated Nutritional Needs:   Kcal:  1700-1900kcal/day  Protein:  85-95g/day  Fluid:  >1.5L/day  Koleen Distance MS, RD, LDN Pager #- [phone] Office#- [phone] After Hours Pager: [phone]

## 2019-11-25 LAB — BASIC METABOLIC PANEL
Anion gap: 10 (ref 5–15)
BUN: 10 mg/dL (ref 8–23)
CO2: 25 mmol/L (ref 22–32)
Calcium: 8.5 mg/dL — ABNORMAL LOW (ref 8.9–10.3)
Chloride: 103 mmol/L (ref 98–111)
Creatinine, Ser: 0.84 mg/dL (ref 0.44–1.00)
GFR calc Af Amer: >60 mL/min (ref >60)
GFR calc non Af Amer: >60 mL/min (ref >60)
Glucose, Bld: 80 mg/dL (ref 70–99)
Potassium: 3.8 mmol/L (ref 3.5–5.1)
Sodium: 138 mmol/L (ref 135–145)

## 2019-11-25 LAB — CBC
HCT: 35.2 % — ABNORMAL LOW (ref 36.0–46.0)
Hemoglobin: 11.4 g/dL — ABNORMAL LOW (ref 12.0–15.0)
MCH: 31.8 pg (ref 26.0–34.0)
MCHC: 32.4 g/dL (ref 30.0–36.0)
MCV: 98.3 fL (ref 80.0–100.0)
Platelets: 262 10*3/uL (ref 150–400)
RBC: 3.58 MIL/uL — ABNORMAL LOW (ref 3.87–5.11)
RDW: 14.6 % (ref 11.5–15.5)
WBC: 4.9 10*3/uL (ref 4.0–10.5)
nRBC: 0 % (ref 0.0–0.2)

## 2019-11-25 MED ORDER — METOPROLOL SUCCINATE ER 50 MG PO TB24: 50.0000 mg | ORAL_TABLET | Freq: Every day | ORAL | 0 refills | Status: AC

## 2019-11-25 MED ORDER — ENSURE ENLIVE PO LIQD: 237.0000 mL | Freq: Three times a day (TID) | ORAL | 12 refills | Status: AC

## 2019-11-25 MED ORDER — ADULT MULTIVITAMIN W/MINERALS CH: 1.0000 | ORAL_TABLET | Freq: Every day | ORAL | Status: AC

## 2019-11-25 MED ORDER — PSYLLIUM 95 % PO PACK: 1.0000 | PACK | Freq: Every day | ORAL | Status: DC

## 2019-11-25 MED ORDER — MIRTAZAPINE 30 MG PO TABS: 30.0000 mg | ORAL_TABLET | Freq: Every day | ORAL | Status: DC

## 2019-11-25 MED ORDER — LORATADINE 10 MG PO TABS: 10.0000 mg | ORAL_TABLET | Freq: Every day | ORAL | Status: DC

## 2019-11-25 MED ORDER — ACETAMINOPHEN 325 MG PO TABS: 650.0000 mg | ORAL_TABLET | ORAL | Status: DC | PRN

## 2019-11-25 MED ORDER — AMITRIPTYLINE HCL 10 MG PO TABS: 10.0000 mg | ORAL_TABLET | Freq: Every day | ORAL | Status: DC

## 2019-11-25 MED ORDER — ONDANSETRON HCL 8 MG PO TABS: 8.0000 mg | ORAL_TABLET | Freq: Three times a day (TID) | ORAL | 0 refills | Status: DC

## 2019-11-25 MED ORDER — PANTOPRAZOLE SODIUM 40 MG PO TBEC: 40.0000 mg | DELAYED_RELEASE_TABLET | Freq: Every day | ORAL | Status: AC

## 2019-11-25 MED ORDER — AMLODIPINE BESYLATE 10 MG PO TABS: 10.0000 mg | ORAL_TABLET | Freq: Every day | ORAL | Status: DC

## 2019-11-25 MED ORDER — ASCORBIC ACID 250 MG PO TABS: 250.0000 mg | ORAL_TABLET | Freq: Two times a day (BID) | ORAL | Status: AC

## 2019-11-25 NOTE — TOC Transition Note (Signed)
Transition of Care [facility]) - CM/SW Discharge Note   Patient Details  Name: Valerie Hall MRN: [id number] Date of Birth: 03-06-1951  [facility] CM/SW Contact:  Victorino Dike, RN Phone Number: 11/25/2019, 10:47 AM   Clinical Narrative:     Patient to discharge to [facility] via EMS transport, bedside nurse to call report to facility and call EMS when ready.  Family notified via VM.  Final next level of care: Skilled Nursing Facility Barriers to Discharge: Barriers Resolved   Patient Goals and CMS Choice        Discharge Placement              Patient chooses bed at: [facility] Patient to be transferred to facility by: EMS Name of family member notified: Barton Fanny Patient and family notified of of transfer: 11/25/19  Discharge Plan and Services   Discharge Planning Services: CM Consult                                 Social Determinants of Health ([place]) Interventions     Readmission Risk Interventions No flowsheet data found.

## 2019-11-25 NOTE — Discharge Summary (Signed)
Valerie Hall:[id number] DOB: [birth year] DOA: 11/15/2019  PCP: Idelle Crouch, MD  Admit date: 11/15/2019 Discharge date: 11/25/2019  Admitted From: Home Disposition:  SNF  Recommendations for Outpatient Follow-up:  1. Follow up with PCP in 1 week 2. Please obtain BMP/CBC in one week 3. Please follow up on the following pending results:none 4. F/U orthopedics Dr. Mack Guise in one week  Home Health:none    Discharge Condition:Stable CODE STATUS:Full Code  Diet recommendation: Heart Healthy Brief/Interim Summary: Valerie Hall  is a 68 y.o. female with a known history of hypertension, Mnire's disease, dementia presents to the hospital with s/p falls. Patient is not the best historian and difficult to understand with some slurred speech when presented.  She had stated that her right side was weaker.  In the ER, CT scan of the head was negative.  They did a CT scan of the cervical spine which showed possible pneumonia in the right upper lobe.  She was also found to have a proximal right fibula fracture on x-ray.  Hospitalist services contacted for further evaluation.Evaluation for possible stroke included MRI brain, carotid duplex and echocardiogram which were unrevealing. Neurology consulted. Suspect falls and apparent neurologic symptoms could be secondary to polypharmacy, medications reviewed and adjustments made including gabapentin held, decreased TCA dose, initially stopped Remeron and decreased Klonopin. Ortho consulted for fibula fracture. RLE to remain in split, non-weightbearingon RLE and follow up with Dr. Mack Guise 1 week after discharge. Eventually Remeron was resumed to hopefully stimulate appetite and was well-tolerated.  She received physical therapy and Occupational Therapy.  She was also treated for acute cystitis/UTI which grew staphlugdunensis which was oxacillin resistant and she finished antibiotic treatment.  On presentation she was also found with AKI which  resolved with hydration.  Her blood pressure medications were held initially because she was hypotensive and was resumed as her blood pressure had increased.  Stable to be discharged to SNF today.  Discharge Diagnoses:  Active Problems:   Hypothyroidism   Slurred speech   Acute metabolic encephalopathy   Essential hypertension   Hypotension   Acute cystitis without hematuria   Closed right ankle fracture, with routine healing, subsequent encounter   Alzheimer's dementia without behavioral disturbance ([place])   AKI (acute kidney injury) ([place])   Acute urinary retention    Discharge Instructions  Discharge Instructions    Call MD for:  severe uncontrolled pain   Complete by: As directed    Call MD for:  temperature >100.4   Complete by: As directed    Diet - low sodium heart healthy   Complete by: As directed    Discharge instructions   Complete by: As directed    Follow up with Dr. Thornton Park orthopedics in one week.  Non weightbearing of right lower extremity   Increase activity slowly   Complete by: As directed      Allergies as of 11/25/2019      Reactions   Codeine Other (See Comments)   "Eyes roll back. Body contorts."   Morphine And Related    "body racing" and nausea and vomiting   Pork-derived Products    GI Pain   Prochlorperazine Other (See Comments)   "eyes roll back. Body contorts."      Medication List    STOP taking these medications   acetaZOLAMIDE 500 MG capsule Commonly known as: DIAMOX   cloNIDine 0.2 MG tablet Commonly known as: CATAPRES   divalproex 125 MG DR tablet Commonly known as: DEPAKOTE  gabapentin 300 MG capsule Commonly known as: NEURONTIN   Melatonin 10 MG Tabs   omeprazole 20 MG capsule Commonly known as: PRILOSEC Replaced by: pantoprazole 40 MG tablet     TAKE these medications   acetaminophen 325 MG tablet Commonly known as: TYLENOL Take 2 tablets (650 mg total) by mouth every 4 (four) hours as needed for mild  pain (or temp > 37.5 C (99.5 F)).   amitriptyline 10 MG tablet Commonly known as: ELAVIL Take 1 tablet (10 mg total) by mouth at bedtime. What changed:   medication strength  how much to take   amLODipine 10 MG tablet Commonly known as: NORVASC Take 1 tablet (10 mg total) by mouth daily. Start taking on: November 26, 2019   ascorbic acid 250 MG tablet Commonly known as: VITAMIN C Take 1 tablet (250 mg total) by mouth 2 (two) times daily.   aspirin EC 81 MG tablet Take 81 mg by mouth daily.   Calcium 600 + D 600-200 MG-UNIT Tabs Generic drug: Calcium Carb-Cholecalciferol Take 1 tablet by mouth daily.   Cholecalciferol 125 MCG (5000 UT) Tabs Take 5,000 Units by mouth daily.   clonazePAM 0.5 MG tablet Commonly known as: KLONOPIN Take 1 mg by mouth at bedtime.   donepezil 10 MG tablet Commonly known as: ARICEPT Take 10 mg by mouth at bedtime.   feeding supplement (ENSURE ENLIVE) Liqd Take 237 mLs by mouth 3 (three) times daily between meals.   levothyroxine 50 MCG tablet Commonly known as: SYNTHROID Take 50 mcg by mouth daily before breakfast.   loratadine 10 MG tablet Commonly known as: CLARITIN Take 1 tablet (10 mg total) by mouth daily. Start taking on: November 26, 2019   metoprolol succinate 50 MG 24 hr tablet Commonly known as: TOPROL-XL Take 1 tablet (50 mg total) by mouth daily. Take with or immediately following a meal. Start taking on: November 26, 2019 What changed:   medication strength  how much to take   mirtazapine 30 MG tablet Commonly known as: REMERON Take 1 tablet (30 mg total) by mouth at bedtime. What changed: See the new instructions.   multivitamin with minerals Tabs tablet Take 1 tablet by mouth daily. Start taking on: November 26, 2019   ondansetron 8 MG tablet Commonly known as: ZOFRAN Take 1 tablet (8 mg total) by mouth 3 (three) times daily before meals.   pantoprazole 40 MG tablet Commonly known as: PROTONIX Take 1  tablet (40 mg total) by mouth daily. Start taking on: November 26, 2019 Replaces: omeprazole 20 MG capsule   psyllium 95 % Pack Commonly known as: HYDROCIL/METAMUCIL Take 1 packet by mouth daily. Start taking on: November 26, 2019       Contact information for follow-up providers    Thornton Park, MD Follow up in 7 day(s).   Specialty: Orthopedic Surgery Contact information: [place] [address] [phone]            Contact information for after-discharge care    Destination    [facility] .   Service: Skilled Nursing Contact information: [address] [place] [phone]                 Allergies  Allergen Reactions  . Codeine Other (See Comments)    "Eyes roll back. Body contorts."  . Morphine And Related     "body racing" and nausea and vomiting  . Pork-Derived Products  GI Pain  . Prochlorperazine Other (See Comments)    "eyes roll back. Body contorts."    Consultations:  Orthopedics and neurology   Procedures/Studies: Dg Chest 1 View  Result Date: 11/16/2019 CLINICAL DATA:  Falls.  Possible pneumonia.  Possible CVA. EXAM: CHEST  1 VIEW COMPARISON:  11/15/2019. FINDINGS: Mediastinum hilar structures normal. Mild right perihilar atelectasis/infiltrate. Mild left base subsegmental atelectasis. Tiny left pleural effusion cannot be excluded. No pneumothorax. Heart size normal. No acute bony abnormality identified. IMPRESSION: Mild right perihilar atelectasis/infiltrate. Mild left base subsegmental atelectasis. Tiny left pleural effusion cannot be excluded. Electronically Signed   By: Marcello Moores  Register   On: 11/16/2019 07:01   Dg Ankle 2 Views Right  Result Date: 11/15/2019 CLINICAL DATA:  Pain following fall EXAM: RIGHT ANKLE - 2 VIEW COMPARISON:  None. FINDINGS: Frontal and lateral views were obtained. There is soft tissue swelling. No evident fracture or joint  effusion. Joint spaces appear normal. No erosive change. Ankle mortise appears intact. IMPRESSION: Soft tissue swelling. No fracture evident. No appreciable arthropathy. Ankle mortise appears intact. Electronically Signed   By: Lowella Grip III M.D.   On: 11/15/2019 14:28   Ct Head Wo Contrast  Result Date: 11/15/2019 CLINICAL DATA:  Patient with increased weakness.  Dementia.  Fall. EXAM: CT HEAD WITHOUT CONTRAST CT CERVICAL SPINE WITHOUT CONTRAST TECHNIQUE: Multidetector CT imaging of the head and cervical spine was performed following the standard protocol without intravenous contrast. Multiplanar CT image reconstructions of the cervical spine were also generated. COMPARISON:  CT brain 05/15/2016 FINDINGS: CT HEAD FINDINGS Brain: Ventricles and sulci are appropriate for patient's age. No evidence for acute cortically based infarct, intracranial hemorrhage, mass lesion or mass-effect. Vascular: Unremarkable Skull: Intact. Sinuses/Orbits: Paranasal sinuses are well aerated. Mastoid air cells are unremarkable. Orbits are unremarkable. Other: None. CT CERVICAL SPINE FINDINGS Alignment: Straightening of the normal cervical lordosis. Skull base and vertebrae: No acute fracture. No primary bone lesion or focal pathologic process. Soft tissues and spinal canal: No prevertebral fluid or swelling. No visible canal hematoma. Disc levels: Multilevel degenerative disc disease most pronounced C3-4, C4-5 and C5-6. No acute fracture. Upper chest: Minimal patchy opacities within the right upper lobe. Other: None. IMPRESSION: No acute intracranial process. No acute cervical spine fracture. Mild patchy opacities within the right upper lobe are nonspecific however may be secondary to an infectious/inflammatory process. Consider follow-up chest CT in 4-6 weeks to ensure resolution. Electronically Signed   By: Lovey Newcomer M.D.   On: 11/15/2019 14:03   Ct Cervical Spine Wo Contrast  Result Date: 11/15/2019 CLINICAL DATA:   Patient with increased weakness.  Dementia.  Fall. EXAM: CT HEAD WITHOUT CONTRAST CT CERVICAL SPINE WITHOUT CONTRAST TECHNIQUE: Multidetector CT imaging of the head and cervical spine was performed following the standard protocol without intravenous contrast. Multiplanar CT image reconstructions of the cervical spine were also generated. COMPARISON:  CT brain 05/15/2016 FINDINGS: CT HEAD FINDINGS Brain: Ventricles and sulci are appropriate for patient's age. No evidence for acute cortically based infarct, intracranial hemorrhage, mass lesion or mass-effect. Vascular: Unremarkable Skull: Intact. Sinuses/Orbits: Paranasal sinuses are well aerated. Mastoid air cells are unremarkable. Orbits are unremarkable. Other: None. CT CERVICAL SPINE FINDINGS Alignment: Straightening of the normal cervical lordosis. Skull base and vertebrae: No acute fracture. No primary bone lesion or focal pathologic process. Soft tissues and spinal canal: No prevertebral fluid or swelling. No visible canal hematoma. Disc levels: Multilevel degenerative disc disease most pronounced C3-4, C4-5 and C5-6. No acute fracture. Upper  chest: Minimal patchy opacities within the right upper lobe. Other: None. IMPRESSION: No acute intracranial process. No acute cervical spine fracture. Mild patchy opacities within the right upper lobe are nonspecific however may be secondary to an infectious/inflammatory process. Consider follow-up chest CT in 4-6 weeks to ensure resolution. Electronically Signed   By: Lovey Newcomer M.D.   On: 11/15/2019 14:03   Mr Brain Wo Contrast  Result Date: 11/15/2019 CLINICAL DATA:  Ataxia with stroke suspected EXAM: MRI HEAD WITHOUT CONTRAST TECHNIQUE: Multiplanar, multiecho pulse sequences of the brain and surrounding structures were obtained without intravenous contrast. COMPARISON:  Head CT from earlier today FINDINGS: Brain: No acute infarction, hemorrhage, hydrocephalus, extra-axial collection or mass lesion. Mild for age  cerebral volume loss. Vascular: Normal flow voids Skull and upper cervical spine: Nodes normal marrow signal. C3-4 and C4-5 degenerative disc narrowing. Sinuses/Orbits: Negative IMPRESSION: Unremarkable brain MRI.  No infarct or other acute finding. Electronically Signed   By: Monte Fantasia M.D.   On: 11/15/2019 19:15   Ct Abdomen Pelvis W Contrast  Result Date: 11/15/2019 CLINICAL DATA:  Weakness and lethargy. EXAM: CT ABDOMEN AND PELVIS WITH CONTRAST TECHNIQUE: Multidetector CT imaging of the abdomen and pelvis was performed using the standard protocol following bolus administration of intravenous contrast. CONTRAST:  66m OMNIPAQUE IOHEXOL 300 MG/ML  SOLN COMPARISON:  05/07/2018 FINDINGS: Lower chest: No acute abnormality. Hepatobiliary: No focal liver abnormality is seen. No gallstones, gallbladder wall thickening, or biliary dilatation. Pancreas: Stable atrophic and fatty replaced pancreas. No evidence of pancreatic inflammation Spleen: Normal in size without focal abnormality. Adrenals/Urinary Tract: Adrenal glands are unremarkable. Kidneys are normal, without renal calculi, focal lesion, or hydronephrosis. Bladder is unremarkable. Stomach/Bowel: Moderate fecal material in the colon without evidence of bowel obstruction. No free air. No focal lesion identified. Vascular/Lymphatic: No significant vascular findings are present. No enlarged abdominal or pelvic lymph nodes. Reproductive: Status post hysterectomy. No adnexal masses. Other: No abdominal wall hernia or abnormality. No abdominopelvic ascites. Musculoskeletal: No acute findings. Stable mild loss of height of the L1 vertebral body anteriorly. IMPRESSION: No acute findings in the abdomen or pelvis. Stable atrophic and fatty replaced pancreas. Electronically Signed   By: GAletta EdouardM.D.   On: 11/15/2019 14:09   [place]otid Bilateral (at [facility] Only)  Result Date: 11/16/2019 CLINICAL DATA:  Stroke.  Hypertension EXAM: BILATERAL CAROTID  DUPLEX ULTRASOUND TECHNIQUE: GPearline Cablesscale imaging, color Doppler and duplex ultrasound were performed of bilateral carotid and vertebral arteries in the neck. COMPARISON:  02/17/2015 by report only FINDINGS: Criteria: Quantification of carotid stenosis is based on velocity parameters that correlate the residual internal carotid diameter with NASCET-based stenosis levels, using the diameter of the distal internal carotid lumen as the denominator for stenosis measurement. The following velocity measurements were obtained: RIGHT ICA: 62/17 cm/sec CCA: 631/54cm/sec SYSTOLIC ICA/CCA RATIO:  0.9 ECA: 35 cm/sec LEFT ICA: 125/41 cm/sec (probably overestimated due to poor angle correction) CCA: 600/86cm/sec SYSTOLIC ICA/CCA RATIO:  2.0 ECA: 33 cm/sec RIGHT CAROTID ARTERY: Intimal thickening in the common carotid artery. Eccentric partially calcified plaque in the bulb without high-grade stenosis. Normal waveforms and color Doppler signal. RIGHT VERTEBRAL ARTERY:  Normal flow direction and waveform. LEFT CAROTID ARTERY: Mild intimal thickening. Mild eccentric plaque in the proximal ICA. No high-grade stenosis. Normal waveforms and color Doppler signal. LEFT VERTEBRAL ARTERY:  Normal flow direction and waveform. IMPRESSION: 1. Mild bilateral carotid plaque resulting in less than 50% diameter ICA stenosis. 2.  Antegrade bilateral vertebral  arterial flow. Electronically Signed   By: Lucrezia Europe M.D.   On: 11/16/2019 07:18   Ct L-spine No Charge  Result Date: 11/15/2019 CLINICAL DATA:  Increasing weakness over the past 2 weeks. Multiple falls. Initial encounter. EXAM: CT LUMBAR SPINE WITHOUT CONTRAST TECHNIQUE: Multidetector CT imaging of the lumbar spine was performed without intravenous contrast administration. Multiplanar CT image reconstructions were also generated. COMPARISON:  MRI lumbar spine 07/24/2018. FINDINGS: Segmentation: Standard. Alignment: Maintained. Vertebrae: No acute abnormality. Remote L1 superior endplate  compression fracture with vertebral body height loss of approximately 25% is unchanged. Paraspinal and other soft tissues: See report of dedicated CT abdomen and pelvis this same day. Disc levels: T11-12: Minimal retropulsion off the superior endplate of L1. No stenosis. L1-2: Mild disc bulge. No stenosis. L2-3: Negative. L3-4: There is loss of disc space height and vacuum disc phenomenon. Calcified right paracentral protrusion with cephalad extension is again seen. No stenosis. L4-5: Mild facet arthropathy. Otherwise negative. L5-S1: Bilateral facet degenerative change is worse on the left. No stenosis. IMPRESSION: 1. Negative for fracture or other acute abnormality. 2. Remote L1 superior endplate compression fracture with vertebral body height loss of approximately 25%, unchanged. 3. Degenerative disease appearing worst at L3-4 where there is a calcified right paracentral protrusion with cephalad extension without central canal or foraminal stenosis. Electronically Signed   By: Inge Rise M.D.   On: 11/15/2019 13:59   Dg Chest Portable 1 View  Result Date: 11/15/2019 CLINICAL DATA:  Patient with weakness. EXAM: PORTABLE CHEST 1 VIEW COMPARISON:  Chest radiograph 09/07/2019 FINDINGS: The heart size and mediastinal contours are within normal limits. Both lungs are clear. The visualized skeletal structures are unremarkable. IMPRESSION: No active disease. Electronically Signed   By: Lovey Newcomer M.D.   On: 11/15/2019 13:15   Dg Knee Complete 4 Views Right  Result Date: 11/15/2019 CLINICAL DATA:  Pain after fall EXAM: RIGHT KNEE - COMPLETE 4+ VIEW COMPARISON:  None. FINDINGS: The patella, distal femur, and proximal tibia are intact. A lucency through the proximal fibular diaphysis is subtle but consistent with a nondisplaced fracture. IMPRESSION: Subtle nondisplaced fracture through the proximal fibula. No other acute abnormalities. Electronically Signed   By: Dorise Bullion III M.D   On: 11/15/2019  13:17   Dg Ankle Right Port  Result Date: 11/15/2019 CLINICAL DATA:  Pain after fall EXAM: PORTABLE RIGHT ANKLE - 2 VIEW COMPARISON:  November 15, 2019 FINDINGS: There is a fracture through the medial malleolus without displacement. There is a suspected subtle fracture through the fibular metaphysis. IMPRESSION: 1. Medial malleolar fracture. 2. Suspected subtle fracture through the fibular metaphysis. Electronically Signed   By: Dorise Bullion III M.D   On: 11/15/2019 15:16   Ct Head Code Stroke Wo Contrast`  Addendum Date: 11/16/2019   ADDENDUM REPORT: 11/16/2019 01:40 ADDENDUM: Study discussed by telephone with NP Rufina Falco on 11/16/2019 at 0134 hours. Electronically Signed   By: Genevie Ann M.D.   On: 11/16/2019 01:40   Result Date: 11/16/2019 CLINICAL DATA:  Code stroke. 68 year old female with worsening mental status, now unresponsive. EXAM: CT HEAD WITHOUT CONTRAST TECHNIQUE: Contiguous axial images were obtained from the base of the skull through the vertex without intravenous contrast. COMPARISON:  Head CT and brain MRI yesterday. FINDINGS: Brain: No midline shift, ventriculomegaly, mass effect, evidence of mass lesion, intracranial hemorrhage or evidence of cortically based acute infarction. Stable and negative gray-white matter differentiation throughout the brain. Vascular: No suspicious intracranial vascular hyperdensity. Skull: Stable, negative. Sinuses/Orbits:  Visualized paranasal sinuses and mastoids are stable and well pneumatized. Other: Visualized orbits and scalp soft tissues are within normal limits. ASPECTS [facility] Stroke Program Early CT Score) Total score (0-10 with 10 being normal): 10 IMPRESSION: 1. Stable and negative non-contrast CT appearance of the brain. 2. ASPECTS 10. Electronically Signed: By: Genevie Ann M.D. On: 11/16/2019 01:32       Subjective: Patient has no new complaints today.  Denies any chest pain, shortness of breath, vomiting or abdominal pain. Her  nausea is under control Discharge Exam: Vitals:   11/24/19 2325 11/25/19 0740  BP: (!) 148/72 (!) 149/61  Pulse: 65 62  Resp: 16   Temp: 97.8 F (36.6 C) 98.4 F (36.9 C)  SpO2: 95% 94%   Vitals:   11/24/19 0807 11/24/19 1510 11/24/19 2325 11/25/19 0740  BP: (!) 152/63 132/61 (!) 148/72 (!) 149/61  Pulse: (!) 57 63 65 62  Resp: 17 17 16    Temp: 98.1 F (36.7 C) 98.2 F (36.8 C) 97.8 F (36.6 C) 98.4 F (36.9 C)  TempSrc: Oral Oral Oral   SpO2: 99% 98% 95% 94%  Weight:      Height:        General: Pt is alert, awake, not in acute distress Cardiovascular: RRR, S1/S2 +, no rubs, no gallops Respiratory: CTA bilaterally, no wheezing, no rhonchi Abdominal: Soft, NT, ND, bowel sounds + Extremities: no edema, no cyanosis    The results of significant diagnostics from this hospitalization (including imaging, microbiology, ancillary and laboratory) are listed below for reference.     Microbiology: Recent Results (from the past 240 hour(s))  SARS CORONAVIRUS 2 (TAT 6-24 HRS) Nasopharyngeal Nasopharyngeal Swab     Status: None   Collection Time: 11/15/19  3:21 PM   Specimen: Nasopharyngeal Swab  Result Value Ref Range Status   SARS Coronavirus 2 NEGATIVE NEGATIVE Final    Comment: (NOTE) SARS-CoV-2 target nucleic acids are NOT DETECTED. The SARS-CoV-2 RNA is generally detectable in upper and lower respiratory specimens during the acute phase of infection. Negative results do not preclude SARS-CoV-2 infection, do not rule out co-infections with other pathogens, and should not be used as the sole basis for treatment or other patient management decisions. Negative results must be combined with clinical observations, patient history, and epidemiological information. The expected result is Negative. Fact Sheet for Patients: SugarRoll.be Fact Sheet for Healthcare Providers: https://www.woods-mathews.com/ This test is not yet approved  or cleared by the [place] FDA and  has been authorized for detection and/or diagnosis of SARS-CoV-2 by FDA under an Emergency Use Authorization (EUA). This EUA will remain  in effect (meaning this test can be used) for the duration of the COVID-19 declaration under Section 56 4(b)(1) of the Act, 21 U.S.C. section 360bbb-3(b)(1), unless the authorization is terminated or revoked sooner. Performed at [facility], [place] [address] [place], [place] [address]   Urine Culture     Status: Abnormal   Collection Time: 11/15/19  5:17 PM   Specimen: Urine, Clean Catch  Result Value Ref Range Status   Specimen Description   Final    URINE, CLEAN CATCH Performed at [facility], [address] [place], [place] [address]    Special Requests   Final    Normal Performed at [facility], [address] [place], [place] [address]    Culture >=100,000 COLONIES/mL STAPHYLOCOCCUS LUGDUNENSIS (A)  Final   Report Status 11/18/2019 FINAL  Final   Organism ID, Bacteria STAPHYLOCOCCUS  LUGDUNENSIS (A)  Final      Susceptibility   Staphylococcus lugdunensis - MIC*    CIPROFLOXACIN <=0.5 SENSITIVE Sensitive     GENTAMICIN <=0.5 SENSITIVE Sensitive     NITROFURANTOIN <=16 SENSITIVE Sensitive     OXACILLIN >=4 RESISTANT Resistant     TETRACYCLINE <=1 SENSITIVE Sensitive     VANCOMYCIN <=0.5 SENSITIVE Sensitive     TRIMETH/SULFA <=10 SENSITIVE Sensitive     CLINDAMYCIN <=0.25 SENSITIVE Sensitive     RIFAMPIN <=0.5 SENSITIVE Sensitive     Inducible Clindamycin NEGATIVE Sensitive     * >=100,000 COLONIES/mL STAPHYLOCOCCUS LUGDUNENSIS  SARS CORONAVIRUS 2 (TAT 6-24 HRS) Nasopharyngeal Nasopharyngeal Swab     Status: None   Collection Time: 11/23/19 10:23 AM   Specimen: Nasopharyngeal Swab  Result Value Ref Range Status   SARS Coronavirus 2 NEGATIVE NEGATIVE Final    Comment: (NOTE) SARS-CoV-2 target nucleic acids are NOT DETECTED. The SARS-CoV-2 RNA is generally detectable  in upper and lower respiratory specimens during the acute phase of infection. Negative results do not preclude SARS-CoV-2 infection, do not rule out co-infections with other pathogens, and should not be used as the sole basis for treatment or other patient management decisions. Negative results must be combined with clinical observations, patient history, and epidemiological information. The expected result is Negative. Fact Sheet for Patients: SugarRoll.be Fact Sheet for Healthcare Providers: https://www.woods-mathews.com/ This test is not yet approved or cleared by the [place] FDA and  has been authorized for detection and/or diagnosis of SARS-CoV-2 by FDA under an Emergency Use Authorization (EUA). This EUA will remain  in effect (meaning this test can be used) for the duration of the COVID-19 declaration under Section 56 4(b)(1) of the Act, 21 U.S.C. section 360bbb-3(b)(1), unless the authorization is terminated or revoked sooner. Performed at [facility], [place] [address], [place], [place] [address]      Labs: BNP (last 3 results) No results for input(s): BNP in the last 8760 hours. Basic Metabolic Panel: Recent Labs  Lab 11/19/19 0349 11/22/19 0453 11/25/19 0558  NA 143  --  138  K 4.2  --  3.8  CL 115*  --  103  CO2 20*  --  25  GLUCOSE 99  --  80  BUN 17  --  10  CREATININE 0.87 0.80 0.84  CALCIUM 7.3*  --  8.5*  MG 2.3  --   --    Liver Function Tests: No results for input(s): AST, ALT, ALKPHOS, BILITOT, PROT, ALBUMIN in the last 168 hours. No results for input(s): LIPASE, AMYLASE in the last 168 hours. No results for input(s): AMMONIA in the last 168 hours. CBC: Recent Labs  Lab 11/19/19 0349 11/25/19 0558  WBC 7.8 4.9  HGB 10.1* 11.4*  HCT 31.9* 35.2*  MCV 99.7 98.3  PLT 185 262   Cardiac Enzymes: No results for input(s): CKTOTAL, CKMB, CKMBINDEX, TROPONINI in the last 168 hours. BNP: Invalid  input(s): POCBNP CBG: No results for input(s): GLUCAP in the last 168 hours. D-Dimer No results for input(s): DDIMER in the last 72 hours. Hgb A1c No results for input(s): HGBA1C in the last 72 hours. Lipid Profile No results for input(s): CHOL, HDL, LDLCALC, TRIG, CHOLHDL, LDLDIRECT in the last 72 hours. Thyroid function studies No results for input(s): TSH, T4TOTAL, T3FREE, THYROIDAB in the last 72 hours.  Invalid input(s): FREET3 Anemia work up Recent Labs    11/24/19 1429  VITAMINB12 345  FOLATE 16.7   Urinalysis  Component Value Date/Time   COLORURINE YELLOW (A) 11/15/2019 1717   APPEARANCEUR CLOUDY (A) 11/15/2019 1717   LABSPEC 1.023 11/15/2019 1717   PHURINE 6.0 11/15/2019 1717   GLUCOSEU NEGATIVE 11/15/2019 1717   HGBUR NEGATIVE 11/15/2019 1717   BILIRUBINUR NEGATIVE 11/15/2019 1717   KETONESUR NEGATIVE 11/15/2019 1717   PROTEINUR NEGATIVE 11/15/2019 1717   NITRITE POSITIVE (A) 11/15/2019 1717   LEUKOCYTESUR LARGE (A) 11/15/2019 1717   Sepsis Labs Invalid input(s): PROCALCITONIN,  WBC,  LACTICIDVEN Microbiology Recent Results (from the past 240 hour(s))  SARS CORONAVIRUS 2 (TAT 6-24 HRS) Nasopharyngeal Nasopharyngeal Swab     Status: None   Collection Time: 11/15/19  3:21 PM   Specimen: Nasopharyngeal Swab  Result Value Ref Range Status   SARS Coronavirus 2 NEGATIVE NEGATIVE Final    Comment: (NOTE) SARS-CoV-2 target nucleic acids are NOT DETECTED. The SARS-CoV-2 RNA is generally detectable in upper and lower respiratory specimens during the acute phase of infection. Negative results do not preclude SARS-CoV-2 infection, do not rule out co-infections with other pathogens, and should not be used as the sole basis for treatment or other patient management decisions. Negative results must be combined with clinical observations, patient history, and epidemiological information. The expected result is Negative. Fact Sheet for  Patients: SugarRoll.be Fact Sheet for Healthcare Providers: https://www.woods-mathews.com/ This test is not yet approved or cleared by the [place] FDA and  has been authorized for detection and/or diagnosis of SARS-CoV-2 by FDA under an Emergency Use Authorization (EUA). This EUA will remain  in effect (meaning this test can be used) for the duration of the COVID-19 declaration under Section 56 4(b)(1) of the Act, 21 U.S.C. section 360bbb-3(b)(1), unless the authorization is terminated or revoked sooner. Performed at [facility], [place] [address] [place], [place] [address]   Urine Culture     Status: Abnormal   Collection Time: 11/15/19  5:17 PM   Specimen: Urine, Clean Catch  Result Value Ref Range Status   Specimen Description   Final    URINE, CLEAN CATCH Performed at [facility], [address] [place], [place] [address]    Special Requests   Final    Normal Performed at [facility], [address] [place], [place] [address]    Culture >=100,000 COLONIES/mL STAPHYLOCOCCUS LUGDUNENSIS (A)  Final   Report Status 11/18/2019 FINAL  Final   Organism ID, Bacteria STAPHYLOCOCCUS LUGDUNENSIS (A)  Final      Susceptibility   Staphylococcus lugdunensis - MIC*    CIPROFLOXACIN <=0.5 SENSITIVE Sensitive     GENTAMICIN <=0.5 SENSITIVE Sensitive     NITROFURANTOIN <=16 SENSITIVE Sensitive     OXACILLIN >=4 RESISTANT Resistant     TETRACYCLINE <=1 SENSITIVE Sensitive     VANCOMYCIN <=0.5 SENSITIVE Sensitive     TRIMETH/SULFA <=10 SENSITIVE Sensitive     CLINDAMYCIN <=0.25 SENSITIVE Sensitive     RIFAMPIN <=0.5 SENSITIVE Sensitive     Inducible Clindamycin NEGATIVE Sensitive     * >=100,000 COLONIES/mL STAPHYLOCOCCUS LUGDUNENSIS  SARS CORONAVIRUS 2 (TAT 6-24 HRS) Nasopharyngeal Nasopharyngeal Swab     Status: None   Collection Time: 11/23/19 10:23 AM   Specimen: Nasopharyngeal Swab  Result Value Ref Range  Status   SARS Coronavirus 2 NEGATIVE NEGATIVE Final    Comment: (NOTE) SARS-CoV-2 target nucleic acids are NOT DETECTED. The SARS-CoV-2 RNA is generally detectable in upper and lower respiratory specimens during the acute phase of infection. Negative results do not preclude SARS-CoV-2 infection, do not  rule out co-infections with other pathogens, and should not be used as the sole basis for treatment or other patient management decisions. Negative results must be combined with clinical observations, patient history, and epidemiological information. The expected result is Negative. Fact Sheet for Patients: SugarRoll.be Fact Sheet for Healthcare Providers: https://www.woods-mathews.com/ This test is not yet approved or cleared by the [place] FDA and  has been authorized for detection and/or diagnosis of SARS-CoV-2 by FDA under an Emergency Use Authorization (EUA). This EUA will remain  in effect (meaning this test can be used) for the duration of the COVID-19 declaration under Section 56 4(b)(1) of the Act, 21 U.S.C. section 360bbb-3(b)(1), unless the authorization is terminated or revoked sooner. Performed at [facility], [place] [address], [place], [place] [address]      Time coordinating discharge: Over 30 minutes  SIGNED:   Nolberto Hanlon, MD  [facility] 11/25/2019, 10:25 AM Pager   If 7PM-7AM, please contact night-coverage [email or web address] Password [email or web address]

## 2019-11-25 NOTE — TOC Progression Note (Signed)
Transition of Care [facility]) - Progression Note    Patient Details  Name: Valerie Hall MRN: [id number] Date of Birth: [birth year]  Transition of Care [facility]) CM/SW [place], RN Phone Number: 11/25/2019, 8:34 AM  Clinical Narrative:    Received notification from Hilda Blades at [facility] that we received insurance approval and I notified the physician that she is ready to DC from a CM standpoint, Covid is negative   Expected Discharge Plan: [place] Barriers to Discharge: Continued Medical Work up  Expected Discharge Plan and Services Expected Discharge Plan: [place]   Discharge Planning Services: CM Consult   Living arrangements for the past 2 months: Single Family Home                                       Social Determinants of Health (SDOH) Interventions    Readmission Risk Interventions No flowsheet data found.

## 2019-11-27 LAB — COPPER, SERUM: Copper: 118 ug/dL (ref 72–166)

## 2019-11-27 LAB — VITAMIN B1: Vitamin B1 (Thiamine): 124.2 nmol/L (ref 66.5–200.0)

## 2019-11-28 LAB — MISC LABCORP TEST (SEND OUT): Labcorp test code: 4655

## 2019-12-02 LAB — MISC LABCORP TEST (SEND OUT): Labcorp test code: 70115

## 2023-04-06 ENCOUNTER — Other Ambulatory Visit: Payer: Self-pay

## 2023-04-06 ENCOUNTER — Emergency Department: Payer: Medicare HMO

## 2023-04-06 ENCOUNTER — Inpatient Hospital Stay
Admission: EM | Admit: 2023-04-06 | Discharge: 2023-04-12 | DRG: 563 | Disposition: A | Discharge status: Skilled Nursing Facility | Payer: Medicare HMO | Attending: Hospitalist | Admitting: Hospitalist

## 2023-04-06 DIAGNOSIS — Z8249 Family history of ischemic heart disease and other diseases of the circulatory system: Secondary | ICD-10-CM

## 2023-04-06 DIAGNOSIS — I10 Essential (primary) hypertension: Secondary | ICD-10-CM | POA: Diagnosis present

## 2023-04-06 DIAGNOSIS — H8109 Meniere's disease, unspecified ear: Secondary | ICD-10-CM | POA: Diagnosis present

## 2023-04-06 DIAGNOSIS — Y92009 Unspecified place in unspecified non-institutional (private) residence as the place of occurrence of the external cause: Secondary | ICD-10-CM

## 2023-04-06 DIAGNOSIS — W010XXA Fall on same level from slipping, tripping and stumbling without subsequent striking against object, initial encounter: Secondary | ICD-10-CM | POA: Diagnosis present

## 2023-04-06 DIAGNOSIS — S7001XA Contusion of right hip, initial encounter: Secondary | ICD-10-CM | POA: Diagnosis present

## 2023-04-06 DIAGNOSIS — Z79890 Hormone replacement therapy (postmenopausal): Secondary | ICD-10-CM

## 2023-04-06 DIAGNOSIS — T7691XA Unspecified adult maltreatment, suspected, initial encounter: Secondary | ICD-10-CM | POA: Diagnosis present

## 2023-04-06 DIAGNOSIS — F03A18 Unspecified dementia, mild, with other behavioral disturbance: Secondary | ICD-10-CM | POA: Diagnosis present

## 2023-04-06 DIAGNOSIS — E1142 Type 2 diabetes mellitus with diabetic polyneuropathy: Secondary | ICD-10-CM

## 2023-04-06 DIAGNOSIS — D72829 Elevated white blood cell count, unspecified: Secondary | ICD-10-CM | POA: Diagnosis present

## 2023-04-06 DIAGNOSIS — M5030 Other cervical disc degeneration, unspecified cervical region: Secondary | ICD-10-CM | POA: Diagnosis present

## 2023-04-06 DIAGNOSIS — Z90710 Acquired absence of both cervix and uterus: Secondary | ICD-10-CM

## 2023-04-06 DIAGNOSIS — Z833 Family history of diabetes mellitus: Secondary | ICD-10-CM

## 2023-04-06 DIAGNOSIS — K219 Gastro-esophageal reflux disease without esophagitis: Secondary | ICD-10-CM | POA: Insufficient documentation

## 2023-04-06 DIAGNOSIS — S0083XA Contusion of other part of head, initial encounter: Secondary | ICD-10-CM | POA: Diagnosis present

## 2023-04-06 DIAGNOSIS — M5126 Other intervertebral disc displacement, lumbar region: Secondary | ICD-10-CM | POA: Diagnosis present

## 2023-04-06 DIAGNOSIS — Z91014 Allergy to mammalian meats: Secondary | ICD-10-CM

## 2023-04-06 DIAGNOSIS — F03918 Unspecified dementia, unspecified severity, with other behavioral disturbance: Secondary | ICD-10-CM | POA: Insufficient documentation

## 2023-04-06 DIAGNOSIS — S6991XA Unspecified injury of right wrist, hand and finger(s), initial encounter: Secondary | ICD-10-CM

## 2023-04-06 DIAGNOSIS — M25551 Pain in right hip: Secondary | ICD-10-CM

## 2023-04-06 DIAGNOSIS — F419 Anxiety disorder, unspecified: Secondary | ICD-10-CM | POA: Diagnosis present

## 2023-04-06 DIAGNOSIS — Z885 Allergy status to narcotic agent status: Secondary | ICD-10-CM

## 2023-04-06 DIAGNOSIS — S52501A Unspecified fracture of the lower end of right radius, initial encounter for closed fracture: Secondary | ICD-10-CM | POA: Diagnosis not present

## 2023-04-06 DIAGNOSIS — K5090 Crohn's disease, unspecified, without complications: Secondary | ICD-10-CM | POA: Diagnosis present

## 2023-04-06 DIAGNOSIS — F32A Depression, unspecified: Secondary | ICD-10-CM | POA: Diagnosis present

## 2023-04-06 DIAGNOSIS — Z8673 Personal history of transient ischemic attack (TIA), and cerebral infarction without residual deficits: Secondary | ICD-10-CM

## 2023-04-06 DIAGNOSIS — F03A3 Unspecified dementia, mild, with mood disturbance: Secondary | ICD-10-CM | POA: Diagnosis present

## 2023-04-06 DIAGNOSIS — Z79899 Other long term (current) drug therapy: Secondary | ICD-10-CM

## 2023-04-06 DIAGNOSIS — G894 Chronic pain syndrome: Secondary | ICD-10-CM | POA: Diagnosis present

## 2023-04-06 DIAGNOSIS — S62101A Fracture of unspecified carpal bone, right wrist, initial encounter for closed fracture: Secondary | ICD-10-CM

## 2023-04-06 DIAGNOSIS — F03A4 Unspecified dementia, mild, with anxiety: Secondary | ICD-10-CM | POA: Diagnosis present

## 2023-04-06 DIAGNOSIS — Z9851 Tubal ligation status: Secondary | ICD-10-CM

## 2023-04-06 DIAGNOSIS — D649 Anemia, unspecified: Secondary | ICD-10-CM | POA: Diagnosis present

## 2023-04-06 DIAGNOSIS — W19XXXA Unspecified fall, initial encounter: Secondary | ICD-10-CM

## 2023-04-06 DIAGNOSIS — Z888 Allergy status to other drugs, medicaments and biological substances status: Secondary | ICD-10-CM

## 2023-04-06 DIAGNOSIS — G9332 Myalgic encephalomyelitis/chronic fatigue syndrome: Secondary | ICD-10-CM | POA: Diagnosis present

## 2023-04-06 DIAGNOSIS — E039 Hypothyroidism, unspecified: Secondary | ICD-10-CM | POA: Diagnosis present

## 2023-04-06 DIAGNOSIS — Z5941 Food insecurity: Secondary | ICD-10-CM

## 2023-04-06 DIAGNOSIS — M810 Age-related osteoporosis without current pathological fracture: Secondary | ICD-10-CM | POA: Diagnosis present

## 2023-04-06 DIAGNOSIS — Z8719 Personal history of other diseases of the digestive system: Secondary | ICD-10-CM

## 2023-04-06 DIAGNOSIS — Z7982 Long term (current) use of aspirin: Secondary | ICD-10-CM

## 2023-04-06 MED ORDER — LIDOCAINE HCL (PF) 1 % IJ SOLN
INTRAMUSCULAR | Status: AC
  Administered 2023-04-06: 30 mL
  Filled 2023-04-06: qty 30

## 2023-04-06 MED ORDER — LIDOCAINE HCL (PF) 1 % IJ SOLN
30.0000 mL | Freq: Once | INTRAMUSCULAR | Status: AC
  Filled 2023-04-06: qty 30

## 2023-04-06 MED ORDER — ACETAMINOPHEN 500 MG PO TABS
1000.0000 mg | ORAL_TABLET | Freq: Once | ORAL | Status: AC
  Administered 2023-04-06: 1000 mg via ORAL
  Filled 2023-04-06: qty 2

## 2023-04-06 NOTE — ED Triage Notes (Addendum)
BIBEMS, c/o fall, unwitnessed. Pt stroke head on R side of forehead, bruising and edema noted. Possibly deformity to R wrist -wrapped by EMS and pain to R hip. Denies blood thinners. Pt reports she tripped on something, denies LOC. VSS.

## 2023-04-06 NOTE — ED Provider Notes (Addendum)
[facility] Provider Note    Event Date/Time   First MD Initiated Contact with Patient 04/06/23 2222     (approximate)   History   Fall   HPI  Valerie Hall is a 72 y.o. female   Past medical history of dementia, thyroid disease, hypertension, prior stroke, ambulates at baseline with walker who presents emergency department with a slip and fall.  Tripped over the side of her walker and fell striking her head and injuring her right side wrist and hip.  Unable to get up.  No loss of consciousness.  No blood thinners.  Otherwise has been in regular state of health no recent illnesses.  No presyncopal symptoms.  Independent Historian contributed to assessment above: Her son who is at baseline and lives with her.  External Medical Documents Reviewed: Discharge summary dated December 2020 when she was admitted to the hospital for frequent falls      Physical Exam   Triage Vital Signs: ED Triage Vitals [04/06/23 2227]  Enc Vitals Group     BP (!) 151/53     Pulse Rate 74     Resp 18     Temp 98.3 F (36.8 C)     Temp Source Oral     SpO2 93 %     Weight      Height      Head Circumference      Peak Flow      Pain Score      Pain Loc      Pain Edu?      Excl. in GC?     Most recent vital signs: Vitals:   04/06/23 2227  BP: (!) 151/53  Pulse: 74  Resp: 18  Temp: 98.3 F (36.8 C)  SpO2: 93%    General: Awake, no distress.  CV:  Good peripheral perfusion.  Resp:  Normal effort.  Abd:  No distention.  Other:  She has tenderness to right breast, neurovascular intact, hematoma to the forehead, right hip tenderness and pain with ranging, mild right knee tenderness, neurovascular intact to the affected right-sided extremities and no other acute trauma noted on full secondary survey.   ED Results / Procedures / Treatments   Labs (all labs ordered are listed, but only abnormal results are displayed) Labs Reviewed  BASIC METABOLIC  PANEL  CBC WITH DIFFERENTIAL/PLATELET    EKG  ED ECG REPORT I, Pilar Jarvis, the attending physician, personally viewed and interpreted this ECG.   Date: 04/06/2023  EKG Time: 2233  Rate: 68  Rhythm: sinus  Axis: nl  Intervals:none  ST&T Change: No acute ischemic changes    RADIOLOGY I independently reviewed and interpreted CT scan of the head see no obvious bleed or midline shift   PROCEDURES:  Critical Care performed: No  Reduction of fracture  Date/Time: 04/06/2023 11:56 PM  Performed by: Pilar Jarvis, MD Authorized by: Pilar Jarvis, MD  Consent: Verbal consent obtained. Risks and benefits: risks, benefits and alternatives were discussed Consent given by: patient Patient understanding: patient states understanding of the procedure being performed Patient identity confirmed: verbally with patient Time out: Immediately prior to procedure a "time out" was called to verify the correct patient, procedure, equipment, support staff and site/side marked as required. Preparation: Patient was prepped and draped in the usual sterile fashion. Local anesthesia used: yes Anesthesia: hematoma block  Anesthesia: Local anesthesia used: yes Local Anesthetic: lidocaine 1% without epinephrine Anesthetic total: 10 mL  Sedation: Patient sedated:  no  Patient tolerance: patient tolerated the procedure well with no immediate complications Comments: Distal radius fracture blocked and hung to gravity      MEDICATIONS ORDERED IN ED: Medications  acetaminophen (TYLENOL) tablet 1,000 mg (1,000 mg Oral Given 04/06/23 2338)  lidocaine (PF) (XYLOCAINE) 1 % injection 30 mL (30 mLs Other Given by Other 04/06/23 2346)     IMPRESSION / MDM / ASSESSMENT AND PLAN / ED COURSE  I reviewed the triage vital signs and the nursing notes.                                Patient's presentation is most consistent with acute presentation with potential threat to life or bodily  function.  Differential diagnosis includes, but is not limited to, acute traumatic injury due to slip and fall, including but not limited to intracranial bleeding, cervical spine fracture dislocation, fracture dislocation of the right wrist, hip, knee   The patient is on the cardiac monitor to evaluate for evidence of arrhythmia and/or significant heart rate changes.  MDM: Traumatic imaging to include CT scan of the head and neck, x-rays of the right wrist and forearm, x-rays of bilateral hips and right knee.  Anticipate admission given traumatic injuries to this patient who is usually ambulatory with walker at home and lives with family, unable to ambulate due to the injuries.  Basic labs as well anticipation of admission.  Signed out to oncoming provider in stable condition pending the results of her traumatic imaging.       FINAL CLINICAL IMPRESSION(S) / ED DIAGNOSES   Final diagnoses:  Fall, initial encounter  Right wrist injury, initial encounter  Right hip pain  Traumatic hematoma of forehead, initial encounter     Rx / DC Orders   ED Discharge Orders     None        Note:  This document was prepared using Dragon voice recognition software and may include unintentional dictation errors.    Pilar Jarvis, MD 04/06/23 2956    Pilar Jarvis, MD 04/06/23 [phone]

## 2023-04-07 ENCOUNTER — Emergency Department: Payer: Medicare HMO

## 2023-04-07 ENCOUNTER — Encounter: Payer: Self-pay | Admitting: Family Medicine

## 2023-04-07 ENCOUNTER — Observation Stay: Payer: Medicare HMO

## 2023-04-07 DIAGNOSIS — F03918 Unspecified dementia, unspecified severity, with other behavioral disturbance: Secondary | ICD-10-CM | POA: Insufficient documentation

## 2023-04-07 DIAGNOSIS — W19XXXA Unspecified fall, initial encounter: Secondary | ICD-10-CM

## 2023-04-07 DIAGNOSIS — F419 Anxiety disorder, unspecified: Secondary | ICD-10-CM

## 2023-04-07 DIAGNOSIS — S52501A Unspecified fracture of the lower end of right radius, initial encounter for closed fracture: Secondary | ICD-10-CM | POA: Diagnosis not present

## 2023-04-07 DIAGNOSIS — S62101A Fracture of unspecified carpal bone, right wrist, initial encounter for closed fracture: Secondary | ICD-10-CM

## 2023-04-07 DIAGNOSIS — S7001XA Contusion of right hip, initial encounter: Secondary | ICD-10-CM

## 2023-04-07 DIAGNOSIS — K219 Gastro-esophageal reflux disease without esophagitis: Secondary | ICD-10-CM | POA: Insufficient documentation

## 2023-04-07 DIAGNOSIS — E039 Hypothyroidism, unspecified: Secondary | ICD-10-CM | POA: Diagnosis not present

## 2023-04-07 DIAGNOSIS — F32A Depression, unspecified: Secondary | ICD-10-CM

## 2023-04-07 DIAGNOSIS — E1142 Type 2 diabetes mellitus with diabetic polyneuropathy: Secondary | ICD-10-CM

## 2023-04-07 LAB — CBC
HCT: 32 % — ABNORMAL LOW (ref 36.0–46.0)
Hemoglobin: 10.7 g/dL — ABNORMAL LOW (ref 12.0–15.0)
MCH: 32 pg (ref 26.0–34.0)
MCHC: 33.4 g/dL (ref 30.0–36.0)
MCV: 95.8 fL (ref 80.0–100.0)
Platelets: 237 10*3/uL (ref 150–400)
RBC: 3.34 MIL/uL — ABNORMAL LOW (ref 3.87–5.11)
RDW: 13.4 % (ref 11.5–15.5)
WBC: 10.2 10*3/uL (ref 4.0–10.5)
nRBC: 0 % (ref 0.0–0.2)

## 2023-04-07 LAB — BASIC METABOLIC PANEL
Anion gap: 10 (ref 5–15)
Anion gap: 9 (ref 5–15)
BUN: 16 mg/dL (ref 8–23)
BUN: 17 mg/dL (ref 8–23)
CO2: 24 mmol/L (ref 22–32)
CO2: 25 mmol/L (ref 22–32)
Calcium: 8.9 mg/dL (ref 8.9–10.3)
Calcium: 9 mg/dL (ref 8.9–10.3)
Chloride: 100 mmol/L (ref 98–111)
Chloride: 102 mmol/L (ref 98–111)
Creatinine, Ser: 1.24 mg/dL — ABNORMAL HIGH (ref 0.44–1.00)
Creatinine, Ser: 1.32 mg/dL — ABNORMAL HIGH (ref 0.44–1.00)
GFR, Estimated: 43 mL/min — ABNORMAL LOW (ref >60)
GFR, Estimated: 46 mL/min — ABNORMAL LOW (ref >60)
Glucose, Bld: 139 mg/dL — ABNORMAL HIGH (ref 70–99)
Glucose, Bld: 98 mg/dL (ref 70–99)
Potassium: 3.8 mmol/L (ref 3.5–5.1)
Potassium: 4.2 mmol/L (ref 3.5–5.1)
Sodium: 135 mmol/L (ref 135–145)
Sodium: 135 mmol/L (ref 135–145)

## 2023-04-07 LAB — CBC WITH DIFFERENTIAL/PLATELET
Abs Immature Granulocytes: 0.09 10*3/uL — ABNORMAL HIGH (ref 0.00–0.07)
Basophils Absolute: 0 10*3/uL (ref 0.0–0.1)
Basophils Relative: 0 %
Eosinophils Absolute: 0.4 10*3/uL (ref 0.0–0.5)
Eosinophils Relative: 3 %
HCT: 34.8 % — ABNORMAL LOW (ref 36.0–46.0)
Hemoglobin: 11.3 g/dL — ABNORMAL LOW (ref 12.0–15.0)
Immature Granulocytes: 1 %
Lymphocytes Relative: 9 %
Lymphs Abs: 1.6 10*3/uL (ref 0.7–4.0)
MCH: 31.7 pg (ref 26.0–34.0)
MCHC: 32.5 g/dL (ref 30.0–36.0)
MCV: 97.5 fL (ref 80.0–100.0)
Monocytes Absolute: 0.9 10*3/uL (ref 0.1–1.0)
Monocytes Relative: 5 %
Neutro Abs: 14.2 10*3/uL — ABNORMAL HIGH (ref 1.7–7.7)
Neutrophils Relative %: 82 %
Platelets: 272 10*3/uL (ref 150–400)
RBC: 3.57 MIL/uL — ABNORMAL LOW (ref 3.87–5.11)
RDW: 13.4 % (ref 11.5–15.5)
WBC: 17.2 10*3/uL — ABNORMAL HIGH (ref 4.0–10.5)
nRBC: 0 % (ref 0.0–0.2)

## 2023-04-07 MED ORDER — AMLODIPINE BESYLATE 5 MG PO TABS
10.0000 mg | ORAL_TABLET | Freq: Every day | ORAL | Status: DC
  Administered 2023-04-07 – 2023-04-09 (×3): 10 mg via ORAL
  Filled 2023-04-07 (×3): qty 2

## 2023-04-07 MED ORDER — VITAMIN C 500 MG PO TABS
250.0000 mg | ORAL_TABLET | Freq: Two times a day (BID) | ORAL | Status: DC
  Administered 2023-04-07 – 2023-04-12 (×10): 250 mg via ORAL
  Filled 2023-04-07 (×11): qty 1

## 2023-04-07 MED ORDER — VITAMIN D 25 MCG (1000 UNIT) PO TABS
5000.0000 [IU] | ORAL_TABLET | Freq: Every day | ORAL | Status: DC
  Administered 2023-04-07 – 2023-04-12 (×6): 5000 [IU] via ORAL
  Filled 2023-04-07 (×7): qty 5

## 2023-04-07 MED ORDER — TRAMADOL HCL 50 MG PO TABS
50.0000 mg | ORAL_TABLET | Freq: Four times a day (QID) | ORAL | Status: DC | PRN
  Administered 2023-04-07: 50 mg via ORAL
  Filled 2023-04-07: qty 1

## 2023-04-07 MED ORDER — HYDROMORPHONE HCL 1 MG/ML IJ SOLN: 1.0000 mg | INTRAMUSCULAR | Status: DC | PRN

## 2023-04-07 MED ORDER — ACETAMINOPHEN 650 MG RE SUPP: 650.0000 mg | Freq: Four times a day (QID) | RECTAL | Status: DC | PRN

## 2023-04-07 MED ORDER — ONDANSETRON HCL 4 MG PO TABS
4.0000 mg | ORAL_TABLET | Freq: Four times a day (QID) | ORAL | Status: DC | PRN
  Administered 2023-04-09: 4 mg via ORAL
  Filled 2023-04-07: qty 1

## 2023-04-07 MED ORDER — SODIUM CHLORIDE 0.9 % IV SOLN: INTRAVENOUS | Status: DC

## 2023-04-07 MED ORDER — LORATADINE 10 MG PO TABS
10.0000 mg | ORAL_TABLET | Freq: Every day | ORAL | Status: DC
  Administered 2023-04-07 – 2023-04-12 (×6): 10 mg via ORAL
  Filled 2023-04-07 (×6): qty 1

## 2023-04-07 MED ORDER — LEVOTHYROXINE SODIUM 50 MCG PO TABS
50.0000 ug | ORAL_TABLET | Freq: Every day | ORAL | Status: DC
  Administered 2023-04-07 – 2023-04-12 (×6): 50 ug via ORAL
  Filled 2023-04-07 (×6): qty 1

## 2023-04-07 MED ORDER — OXYCODONE-ACETAMINOPHEN 5-325 MG PO TABS
1.0000 | ORAL_TABLET | Freq: Four times a day (QID) | ORAL | Status: DC | PRN
  Administered 2023-04-07 – 2023-04-12 (×9): 1 via ORAL
  Filled 2023-04-07 (×9): qty 1

## 2023-04-07 MED ORDER — ONDANSETRON HCL 4 MG/2ML IJ SOLN
4.0000 mg | Freq: Four times a day (QID) | INTRAMUSCULAR | Status: DC | PRN
  Administered 2023-04-08 – 2023-04-09 (×4): 4 mg via INTRAVENOUS
  Filled 2023-04-07 (×4): qty 2

## 2023-04-07 MED ORDER — ADULT MULTIVITAMIN W/MINERALS CH
1.0000 | ORAL_TABLET | Freq: Every day | ORAL | Status: DC
  Administered 2023-04-07 – 2023-04-12 (×6): 1 via ORAL
  Filled 2023-04-07 (×6): qty 1

## 2023-04-07 MED ORDER — MIRTAZAPINE 15 MG PO TABS: 30.0000 mg | ORAL_TABLET | Freq: Every day | ORAL | Status: DC

## 2023-04-07 MED ORDER — CLONAZEPAM 0.5 MG PO TABS
1.0000 mg | ORAL_TABLET | Freq: Every day | ORAL | Status: DC
  Administered 2023-04-07 – 2023-04-11 (×5): 1 mg via ORAL
  Filled 2023-04-07 (×5): qty 2

## 2023-04-07 MED ORDER — MAGNESIUM HYDROXIDE 400 MG/5ML PO SUSP: 30.0000 mL | Freq: Every day | ORAL | Status: DC | PRN

## 2023-04-07 MED ORDER — TRAZODONE HCL 50 MG PO TABS
25.0000 mg | ORAL_TABLET | Freq: Every evening | ORAL | Status: DC | PRN
  Administered 2023-04-07 – 2023-04-09 (×3): 25 mg via ORAL
  Filled 2023-04-07 (×3): qty 1

## 2023-04-07 MED ORDER — MORPHINE SULFATE (PF) 2 MG/ML IV SOLN: 2.0000 mg | INTRAVENOUS | Status: DC | PRN

## 2023-04-07 MED ORDER — OYSTER SHELL CALCIUM/D3 500-5 MG-MCG PO TABS
1.0000 | ORAL_TABLET | Freq: Every day | ORAL | Status: DC
  Administered 2023-04-07 – 2023-04-12 (×6): 1 via ORAL
  Filled 2023-04-07 (×6): qty 1

## 2023-04-07 MED ORDER — ENSURE ENLIVE PO LIQD: 237.0000 mL | Freq: Three times a day (TID) | ORAL | Status: DC

## 2023-04-07 MED ORDER — ONDANSETRON HCL 4 MG PO TABS: 8.0000 mg | ORAL_TABLET | Freq: Three times a day (TID) | ORAL | Status: DC

## 2023-04-07 MED ORDER — ACETAMINOPHEN 325 MG PO TABS
650.0000 mg | ORAL_TABLET | Freq: Four times a day (QID) | ORAL | Status: DC | PRN
  Administered 2023-04-07 – 2023-04-12 (×10): 650 mg via ORAL
  Filled 2023-04-07 (×10): qty 2

## 2023-04-07 MED ORDER — METOPROLOL SUCCINATE ER 50 MG PO TB24
50.0000 mg | ORAL_TABLET | Freq: Every day | ORAL | Status: DC
  Administered 2023-04-07 – 2023-04-12 (×6): 50 mg via ORAL
  Filled 2023-04-07 (×6): qty 1

## 2023-04-07 MED ORDER — PSYLLIUM 95 % PO PACK
1.0000 | PACK | Freq: Every day | ORAL | Status: DC
  Administered 2023-04-12: 1 via ORAL
  Filled 2023-04-07 (×6): qty 1

## 2023-04-07 MED ORDER — DONEPEZIL HCL 5 MG PO TABS
10.0000 mg | ORAL_TABLET | Freq: Every day | ORAL | Status: DC
  Administered 2023-04-07 – 2023-04-08 (×2): 10 mg via ORAL
  Filled 2023-04-07 (×2): qty 2

## 2023-04-07 MED ORDER — PANTOPRAZOLE SODIUM 40 MG PO TBEC
40.0000 mg | DELAYED_RELEASE_TABLET | Freq: Every day | ORAL | Status: DC
  Administered 2023-04-07 – 2023-04-12 (×6): 40 mg via ORAL
  Filled 2023-04-07 (×6): qty 1

## 2023-04-07 MED ORDER — PANCRELIPASE (LIP-PROT-AMYL) 36000-114000 UNITS PO CPEP
36000.0000 [IU] | ORAL_CAPSULE | Freq: Three times a day (TID) | ORAL | Status: DC
  Administered 2023-04-07 – 2023-04-12 (×16): 36000 [IU] via ORAL
  Filled 2023-04-07 (×16): qty 1
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
## 2023-04-07 NOTE — Assessment & Plan Note (Signed)
-   We will continue Synthroid. 

## 2023-04-07 NOTE — Assessment & Plan Note (Signed)
-   The patient will be placed on supplement coverage with NovoLog. - We will continue Neurontin. 
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
## 2023-04-07 NOTE — Assessment & Plan Note (Signed)
-   We will continue Aricept and Namenda. 

## 2023-04-07 NOTE — Assessment & Plan Note (Signed)
-   We will continue clonazepam, Cymbalta, and Depakote.

## 2023-04-07 NOTE — Assessment & Plan Note (Signed)
-   This is also secondary to her fall. - Wrist was placed in a sugar-tong splint. - Orthopedic consult to be obtained. - I notified Dr. Maximino Sarin about the patient. - Pain management will be provided.

## 2023-04-07 NOTE — Assessment & Plan Note (Addendum)
-   Is associated with a mechanical accidental fall. - Pain management will be provided. - PT consult to be obtained to assist with ambulation. - We will hold off amitriptyline.

## 2023-04-07 NOTE — Assessment & Plan Note (Signed)
-   We will continue PPI therapy 

## 2023-04-07 NOTE — Progress Notes (Signed)
PROGRESS NOTE    Valerie Hall  ZOX:[id number] DOB: 12-Nov-1951 DOA: 04/06/2023 PCP: Marguarite Arbour, MD  Assessment & Plan:   Principal Problem:   Hematoma of right hip Active Problems:   Right wrist fracture   Hypothyroidism   Type 2 diabetes mellitus with peripheral neuropathy   Anxiety and depression   GERD without esophagitis   Dementia with behavioral disturbance   Fall   Closed fracture of right distal radius  Assessment and Plan: Hematoma of right hip: secondary to mechanical fall at home. Percocet, morphine prn for pain. PT/OT consulted   Right wrist fracture: secondary to fall at home. Continue w/ splint. Ortho surg consulted.   Hypothyroidism: continue on home dose of synthroid    DM2: likely poorly controlled. Continue on SSI w/ accuchecks  Peripheral neuropathy: continue on home dose of gabapentin   Dementia: with behavioral disturbance. Continue on home dose of aricept, namenda  GERD: continue on PPI    Anxiety: severity unknown. Continue on home dose of clonazepam  Depression: severity unknown. Continue on home dose cymbalta, depakote       DVT prophylaxis: SCDs Code Status: full  Family Communication:  Disposition Plan: depends on PT/OT recs   Level of care: Med-Surg  Status is: Observation The patient remains OBS appropriate and will d/c before 2 midnights.   Consultants:  Ortho surg   Procedures:   Antimicrobials:   Subjective: Pt c/o wrist pain   Objective: Vitals:   04/07/23 0257 04/07/23 0408 04/07/23 0453 04/07/23 0748  BP: (!) 141/55  (!) 142/59 (!) 134/52  Pulse: 62  61 61  Resp: 14  20 16   Temp:  98.2 F (36.8 C) 98.5 F (36.9 C) 98.3 F (36.8 C)  TempSrc:  Oral    SpO2: 96%  99% 99%   No intake or output data in the 24 hours ending 04/07/23 0758 There were no vitals filed for this visit.  Examination:  General exam: Appears calm and comfortable  Respiratory system: Clear to auscultation. Respiratory  effort normal. Cardiovascular system: S1 & S2 +. No rubs, gallops or clicks. Gastrointestinal system: Abdomen is nondistended, soft and nontender.  Normal bowel sounds heard. Central nervous system: Alert and oriented. Moves all extremities  Psychiatry: Judgement and insight appear normal. Mood & affect appropriate.     Data Reviewed: I have personally reviewed following labs and imaging studies  CBC: Recent Labs  Lab 04/06/23 2341 04/07/23 0515  WBC 17.2* 10.2  NEUTROABS 14.2*  --   HGB 11.3* 10.7*  HCT 34.8* 32.0*  MCV 97.5 95.8  PLT 272 237   Basic Metabolic Panel: Recent Labs  Lab 04/06/23 2341 04/07/23 0515  NA 135 135  K 4.2 3.8  CL 100 102  CO2 25 24  GLUCOSE 139* 98  BUN 16 17  CREATININE 1.32* 1.24*  CALCIUM 8.9 9.0   GFR: CrCl cannot be calculated (Unknown ideal weight.). Liver Function Tests: No results for input(s): "AST", "ALT", "ALKPHOS", "BILITOT", "PROT", "ALBUMIN" in the last 168 hours. No results for input(s): "LIPASE", "AMYLASE" in the last 168 hours. No results for input(s): "AMMONIA" in the last 168 hours. Coagulation Profile: No results for input(s): "INR", "PROTIME" in the last 168 hours. Cardiac Enzymes: No results for input(s): "CKTOTAL", "CKMB", "CKMBINDEX", "TROPONINI" in the last 168 hours. BNP (last 3 results) No results for input(s): "PROBNP" in the last 8760 hours. HbA1C: No results for input(s): "HGBA1C" in the last 72 hours. CBG: No results for input(s): "GLUCAP" in  the last 168 hours. Lipid Profile: No results for input(s): "CHOL", "HDL", "LDLCALC", "TRIG", "CHOLHDL", "LDLDIRECT" in the last 72 hours. Thyroid Function Tests: No results for input(s): "TSH", "T4TOTAL", "FREET4", "T3FREE", "THYROIDAB" in the last 72 hours. Anemia Panel: No results for input(s): "VITAMINB12", "FOLATE", "FERRITIN", "TIBC", "IRON", "RETICCTPCT" in the last 72 hours. Sepsis Labs: No results for input(s): "PROCALCITON", "LATICACIDVEN" in the last  168 hours.  No results found for this or any previous visit (from the past 240 hour(s)).       Radiology Studies: CT PELVIS WO CONTRAST  Result Date: 04/07/2023 CLINICAL DATA:  Fall, right hip and pelvic pain EXAM: CT PELVIS WITHOUT CONTRAST TECHNIQUE: Multidetector CT imaging of the pelvis was performed following the standard protocol without intravenous contrast. RADIATION DOSE REDUCTION: This exam was performed according to the departmental dose-optimization program which includes automated exposure control, adjustment of the mA and/or kV according to patient size and/or use of iterative reconstruction technique. COMPARISON:  CT abdomen pelvis 11/15/2019 FINDINGS: Urinary Tract:  No abnormality visualized. Bowel: The visualized large and small bowel are unremarkable. Appendix absent. Trace free fluid within the pelvis, nonspecific. Vascular/Lymphatic: No pathologically enlarged lymph nodes. No significant vascular abnormality seen. Reproductive: Status post hysterectomy. No adnexal mass. Residual ovaries are unremarkable. Other: Small subcutaneous hematoma within the soft tissues lateral to the right hip measuring up to 10 mm in diameter. Musculoskeletal: No acute bone abnormality. Specifically, no fracture or dislocation of the right hip. Degenerative changes are seen within the lumbosacral junction. IMPRESSION: 1. Small subcutaneous hematoma within the soft tissues lateral to the right hip measuring up to 10 mm in diameter. No fracture or dislocation of the right hip. 2. Trace free fluid within the pelvis, nonspecific. Electronically Signed   By: Helyn Numbers M.D.   On: 04/07/2023 02:05   CT Cervical Spine Wo Contrast  Result Date: 04/06/2023 CLINICAL DATA:  Fall EXAM: CT HEAD WITHOUT CONTRAST CT CERVICAL SPINE WITHOUT CONTRAST TECHNIQUE: Multidetector CT imaging of the head and cervical spine was performed following the standard protocol without intravenous contrast. Multiplanar CT image  reconstructions of the cervical spine were also generated. RADIATION DOSE REDUCTION: This exam was performed according to the departmental dose-optimization program which includes automated exposure control, adjustment of the mA and/or kV according to patient size and/or use of iterative reconstruction technique. COMPARISON:  None Available. FINDINGS: CT HEAD FINDINGS Brain: There is no mass, hemorrhage or extra-axial collection. The size and configuration of the ventricles and extra-axial CSF spaces are normal. There is hypoattenuation of the periventricular white matter, most commonly indicating chronic ischemic microangiopathy. Vascular: No abnormal hyperdensity of the major intracranial arteries or dural venous sinuses. No intracranial atherosclerosis. Skull: Right frontal scalp hematoma.  No skull fracture. Sinuses/Orbits: No fluid levels or advanced mucosal thickening of the visualized paranasal sinuses. No mastoid or middle ear effusion. The orbits are normal. CT CERVICAL SPINE FINDINGS Alignment: No static subluxation. Facets are aligned. Occipital condyles are normally positioned. Skull base and vertebrae: No acute fracture. Soft tissues and spinal canal: No prevertebral fluid or swelling. No visible canal hematoma. Disc levels: No advanced spinal canal or neural foraminal stenosis. Upper chest: No pneumothorax, pulmonary nodule or pleural effusion. Other: Normal visualized paraspinal cervical soft tissues. IMPRESSION: 1. No acute intracranial abnormality. 2. Right frontal scalp hematoma without skull fracture. 3. No acute fracture or static subluxation of the cervical spine. Electronically Signed   By: Deatra Robinson M.D.   On: 04/06/2023 23:47   CT Head Wo Contrast  Result Date: 04/06/2023 CLINICAL DATA:  Fall EXAM: CT HEAD WITHOUT CONTRAST CT CERVICAL SPINE WITHOUT CONTRAST TECHNIQUE: Multidetector CT imaging of the head and cervical spine was performed following the standard protocol without  intravenous contrast. Multiplanar CT image reconstructions of the cervical spine were also generated. RADIATION DOSE REDUCTION: This exam was performed according to the departmental dose-optimization program which includes automated exposure control, adjustment of the mA and/or kV according to patient size and/or use of iterative reconstruction technique. COMPARISON:  None Available. FINDINGS: CT HEAD FINDINGS Brain: There is no mass, hemorrhage or extra-axial collection. The size and configuration of the ventricles and extra-axial CSF spaces are normal. There is hypoattenuation of the periventricular white matter, most commonly indicating chronic ischemic microangiopathy. Vascular: No abnormal hyperdensity of the major intracranial arteries or dural venous sinuses. No intracranial atherosclerosis. Skull: Right frontal scalp hematoma.  No skull fracture. Sinuses/Orbits: No fluid levels or advanced mucosal thickening of the visualized paranasal sinuses. No mastoid or middle ear effusion. The orbits are normal. CT CERVICAL SPINE FINDINGS Alignment: No static subluxation. Facets are aligned. Occipital condyles are normally positioned. Skull base and vertebrae: No acute fracture. Soft tissues and spinal canal: No prevertebral fluid or swelling. No visible canal hematoma. Disc levels: No advanced spinal canal or neural foraminal stenosis. Upper chest: No pneumothorax, pulmonary nodule or pleural effusion. Other: Normal visualized paraspinal cervical soft tissues. IMPRESSION: 1. No acute intracranial abnormality. 2. Right frontal scalp hematoma without skull fracture. 3. No acute fracture or static subluxation of the cervical spine. Electronically Signed   By: Deatra Robinson M.D.   On: 04/06/2023 23:47   DG Knee Complete 4 Views Right  Result Date: 04/06/2023 CLINICAL DATA:  Fall EXAM: RIGHT KNEE - COMPLETE 4+ VIEW COMPARISON:  None Available. FINDINGS: Early joint space narrowing and spurring. No acute bony  abnormality. Specifically, no fracture, subluxation, or dislocation. No joint effusion. IMPRESSION: Early degenerative changes.  No acute bony abnormality. Electronically Signed   By: Charlett Nose M.D.   On: 04/06/2023 23:34   DG Forearm Right  Result Date: 04/06/2023 CLINICAL DATA:  Fall EXAM: RIGHT FOREARM - 2 VIEW COMPARISON:  Wrist series today FINDINGS: Age-indeterminate distal right radial fracture noted. This appears more acute on these images when compared to the wrist series. Therefore, I suspect this is an acute fracture. No ulnar abnormality. No subluxation or dislocation. IMPRESSION: Age indeterminate distal right radial fracture with more of an acute appearance on these images relative to the wrist series. Electronically Signed   By: Charlett Nose M.D.   On: 04/06/2023 23:34   DG Hips Bilat W or Wo Pelvis 2 Views  Result Date: 04/06/2023 CLINICAL DATA:  Fall, right hip pain EXAM: DG HIP (WITH OR WITHOUT PELVIS) 2V BILAT COMPARISON:  None Available. FINDINGS: There is no evidence of hip fracture or dislocation. There is no evidence of arthropathy or other focal bone abnormality. IMPRESSION: Negative. Electronically Signed   By: Charlett Nose M.D.   On: 04/06/2023 23:33   DG Wrist Complete Right  Result Date: 04/06/2023 CLINICAL DATA:  Fall, deformity to right wrist EXAM: RIGHT WRIST - COMPLETE 3+ VIEW COMPARISON:  None Available. FINDINGS: Comminuted intra-articular fracture noted through the distal right radius. This is age indeterminate. Some areas appear to demonstrate evidence of healing suggesting this could be chronic/subacute. Fracture lines remain evident in some areas. No subluxation or dislocation. IMPRESSION: Comminuted intra-articular fracture through the distal right radius which is age indeterminate, possibly chronic or subacute. Recommend clinical correlation  Electronically Signed   By: Charlett Nose M.D.   On: 04/06/2023 23:32        Scheduled Meds:  amLODipine  10 mg Oral  Daily   ascorbic acid  250 mg Oral BID   calcium-vitamin D  1 tablet Oral Q breakfast   cholecalciferol  5,000 Units Oral Daily   clonazePAM  1 mg Oral QHS   donepezil  10 mg Oral QHS   feeding supplement  237 mL Oral TID BM   levothyroxine  50 mcg Oral Q0600   loratadine  10 mg Oral Daily   metoprolol succinate  50 mg Oral Daily   mirtazapine  30 mg Oral QHS   multivitamin with minerals  1 tablet Oral Daily   pantoprazole  40 mg Oral Daily   psyllium  1 packet Oral Daily   Continuous Infusions:  sodium chloride 100 mL/hr at 04/07/23 0452     LOS: 0 days    Time spent: 35 mins     Charise Killian, MD [facility] Pager [phone]  If 7PM-7AM, please contact night-coverage [email or web address] 04/07/2023, 7:58 AM

## 2023-04-07 NOTE — Plan of Care (Signed)

## 2023-04-07 NOTE — Evaluation (Signed)
Physical Therapy Evaluation Patient Details Name: Valerie Hall MRN: [id number] DOB: Aug 19, 1951 Today's Date: 04/07/2023  History of Present Illness  Patient is a 72 year old female presenting after mechanical fall. Right forearm x-ray showed age-indeterminate right distal radial fracture s/p reduction of right wrist fracture with splint applied. Hematoma of R hip. PT consulted to assist with ambulation.  Clinical Impression  Patient is agreeable to PT evaluation. She reports she is independent using her walker at home. She lives with several family members and has stairs to enter her house.  Today, the patient complains of R wrist and R hip pain with movement. She required physical assistance for bed mobility. She was unwilling to try standing due to fear of increased right hip pain, and she does report increased pain with weight bearing during lateral scoot transfer. Cues to maintain NWB of right wrist with functional activity. The patient is hopeful to return home. She likely will require physical assistance with mobility with continued PT recommended after this hospital discharge.      Recommendations for follow up therapy are one component of a multi-disciplinary discharge planning process, led by the attending physician.  Recommendations may be updated based on patient status, additional functional criteria and insurance authorization.  Follow Up Recommendations       Assistance Recommended at Discharge Intermittent Supervision/Assistance  Patient can return home with the following  A lot of help with walking and/or transfers;A lot of help with bathing/dressing/bathroom;Assist for transportation;Help with stairs or ramp for entrance;Assistance with cooking/housework    Equipment Recommendations  (to  be determined)  Recommendations for Other Services       Functional Status Assessment Patient has had a recent decline in their functional status and demonstrates the ability to make  significant improvements in function in a reasonable and predictable amount of time.     Precautions / Restrictions Precautions Precautions: Fall Required Braces or Orthoses: Splint/Cast Splint/Cast: RUE Restrictions Weight Bearing Restrictions: Yes RUE Weight Bearing:  (per ortho notes, okay to use platform walker) RLE Weight Bearing: Weight bearing as tolerated      Mobility  Bed Mobility Overal bed mobility: Needs Assistance Bed Mobility: Supine to Sit, Sit to Supine     Supine to sit: Min assist Sit to supine: Mod assist   General bed mobility comments: assistance for RLE support. verbal cues for technique and to maintain NWB of right wrist with activity    Transfers Overall transfer level: Needs assistance   Transfers: Bed to chair/wheelchair/BSC            Lateral/Scoot Transfers: Min assist General transfer comment: patient is able to perform incremental scooting to the right side along edge of bed with pain with weight bearing through RLE. cues for technique. patient declined standing due to fear of increased pain in the hip. platform walker is set up and in the room    Ambulation/Gait                  Stairs            Wheelchair Mobility    Modified Rankin (Stroke Patients Only)       Balance Overall balance assessment: Needs assistance Sitting-balance support: Feet supported Sitting balance-Leahy Scale: Fair                                       Pertinent Vitals/Pain Pain Assessment Pain  Assessment: 0-10 Pain Score: 7  Pain Location: R hip, R wrist Pain Descriptors / Indicators: Discomfort Pain Intervention(s): Limited activity within patient's tolerance, Monitored during session, Repositioned (offered ice, patient declines)    Home Living Family/patient expects to be discharged to:: Private residence Living Arrangements: Children;Other relatives Available Help at Discharge: Family;Available  PRN/intermittently Type of Home: House Home Access: Stairs to enter   [employer] of Steps: 4   Home Layout: One level Home Equipment: Rollator (4 wheels);BSC/3in1;Tub bench;Hand held shower head;Transport chair      Prior Function Prior Level of Function : Independent/Modified Independent             Mobility Comments: using 4 wheeled walker for ambulation ADLs Comments: Mod I     Hand Dominance   Dominant Hand: Right    Extremity/Trunk Assessment   Upper Extremity Assessment Upper Extremity Assessment: RUE deficits/detail RUE Deficits / Details: cast with ace wrap present. shoulder AROM WFL for functional activity. able to move digits but with pain RUE Sensation: WNL    Lower Extremity Assessment Lower Extremity Assessment: RLE deficits/detail RLE Deficits / Details: pain with ROM of R hip. able to activate movement in hip/knee/ankle       Communication   Communication: No difficulties  Cognition Arousal/Alertness: Awake/alert Behavior During Therapy: WFL for tasks assessed/performed Overall Cognitive Status: Within Functional Limits for tasks assessed                                          General Comments General comments (skin integrity, edema, etc.): encouraged ice/elevation of RUE. patient declined ice pack at this time    Exercises     Assessment/Plan    PT Assessment Patient needs continued PT services  PT Problem List Decreased strength;Decreased range of motion;Decreased activity tolerance;Decreased balance;Decreased mobility;Decreased safety awareness;Decreased knowledge of precautions;Pain       PT Treatment Interventions DME instruction;Gait training;Stair training;Therapeutic activities;Functional mobility training;Therapeutic exercise;Balance training;Neuromuscular re-education;Patient/family education;Wheelchair mobility training    PT Goals (Current goals can be found in the Care Plan section)  Acute Rehab  PT Goals Patient Stated Goal: to go home PT Goal Formulation: With patient Time For Goal Achievement: 04/21/23 Potential to Achieve Goals: Fair    Frequency 7X/week     Co-evaluation               AM-PAC PT "6 Clicks" Mobility  Outcome Measure Help needed turning from your back to your side while in a flat bed without using bedrails?: A Lot Help needed moving from lying on your back to sitting on the side of a flat bed without using bedrails?: A Lot Help needed moving to and from a bed to a chair (including a wheelchair)?: A Lot Help needed standing up from a chair using your arms (e.g., wheelchair or bedside chair)?: A Lot Help needed to walk in hospital room?: A Lot Help needed climbing 3-5 steps with a railing? : Total 6 Click Score: 11    End of Session   Activity Tolerance: Patient limited by pain Patient left: in bed;with call bell/phone within reach;with bed alarm set Nurse Communication: Mobility status PT Visit Diagnosis: Difficulty in walking, not elsewhere classified (R26.2);Pain Pain - Right/Left: Right Pain - part of body: Hip;Arm    Time: 1105-1130 PT Time Calculation (min) (ACUTE ONLY): 25 min   Charges:   PT Evaluation $PT Eval Low Complexity: 1  Low PT Treatments $Therapeutic Activity: 8-22 mins       Donna Bernard, PT, MPT   Ina Homes 04/07/2023, 1:11 PM

## 2023-04-07 NOTE — ED Provider Notes (Signed)
Patient received in signout from Dr. Modesto Charon.  Hematoma block applied and she was placed in finger traps to hang to gravity to help with reduction.  With the assistance of staff I then perform manual reduction and splint with a sugar-tong splint.  She is complaining of persistent right hip pain despite negative x-rays so we get a CT of the pelvis which demonstrates a hematoma but no bony injury.  She is typically ambulatory with a walker and unable to ambulate due to her fall.  We will consult with medicine for admission to assist with pain control and PT.  Reduction of fracture  Date/Time: 04/07/2023 3:38 AM  Performed by: Delton Prairie, MD Authorized by: Delton Prairie, MD  Risks and benefits: risks, benefits and alternatives were discussed Patient tolerance: patient tolerated the procedure well with no immediate complications Comments: After hanging with gravity, attempted to reduce with reproduction of injury, hyperextension and traction   .Splint Application  Date/Time: 04/07/2023 3:38 AM  Performed by: Delton Prairie, MD Authorized by: Delton Prairie, MD   Consent:    Consent obtained:  Verbal   Consent given by:  Patient   Risks, benefits, and alternatives were discussed: yes   Procedure details:    Supplies:  Cotton padding, fiberglass and elastic bandage   Attestation: Splint applied and adjusted personally by me   Post-procedure details:    Procedure completion:  Tolerated well, no immediate complications     Delton Prairie, MD 04/07/23 [phone]

## 2023-04-08 DIAGNOSIS — E1169 Type 2 diabetes mellitus with other specified complication: Secondary | ICD-10-CM | POA: Diagnosis not present

## 2023-04-08 DIAGNOSIS — S62101A Fracture of unspecified carpal bone, right wrist, initial encounter for closed fracture: Secondary | ICD-10-CM

## 2023-04-08 DIAGNOSIS — S7001XA Contusion of right hip, initial encounter: Secondary | ICD-10-CM | POA: Diagnosis not present

## 2023-04-08 LAB — CBC
HCT: 34.5 % — ABNORMAL LOW (ref 36.0–46.0)
Hemoglobin: 11 g/dL — ABNORMAL LOW (ref 12.0–15.0)
MCH: 31.3 pg (ref 26.0–34.0)
MCHC: 31.9 g/dL (ref 30.0–36.0)
MCV: 98 fL (ref 80.0–100.0)
Platelets: 231 10*3/uL (ref 150–400)
RBC: 3.52 MIL/uL — ABNORMAL LOW (ref 3.87–5.11)
RDW: 13.6 % (ref 11.5–15.5)
WBC: 7.7 10*3/uL (ref 4.0–10.5)
nRBC: 0 % (ref 0.0–0.2)

## 2023-04-08 LAB — BASIC METABOLIC PANEL
Anion gap: 8 (ref 5–15)
BUN: 13 mg/dL (ref 8–23)
CO2: 27 mmol/L (ref 22–32)
Calcium: 8.7 mg/dL — ABNORMAL LOW (ref 8.9–10.3)
Chloride: 102 mmol/L (ref 98–111)
Creatinine, Ser: 1.23 mg/dL — ABNORMAL HIGH (ref 0.44–1.00)
GFR, Estimated: 46 mL/min — ABNORMAL LOW (ref >60)
Glucose, Bld: 124 mg/dL — ABNORMAL HIGH (ref 70–99)
Potassium: 4.1 mmol/L (ref 3.5–5.1)
Sodium: 137 mmol/L (ref 135–145)

## 2023-04-08 MED ORDER — POLYVINYL ALCOHOL 1.4 % OP SOLN
1.0000 [drp] | OPHTHALMIC | Status: DC | PRN
  Administered 2023-04-08: 1 [drp] via OPHTHALMIC
  Filled 2023-04-08: qty 15

## 2023-04-08 NOTE — Progress Notes (Signed)
PROGRESS NOTE    Valerie Hall  RUE:[id number] DOB: 1951-11-30 DOA: 04/06/2023 PCP: Marguarite Arbour, MD  Assessment & Plan:   Principal Problem:   Hematoma of right hip Active Problems:   Right wrist fracture   Hypothyroidism   Type 2 diabetes mellitus with peripheral neuropathy   Anxiety and depression   GERD without esophagitis   Dementia with behavioral disturbance   Fall   Closed fracture of right distal radius  Assessment and Plan: Hematoma of right hip: secondary to mechanical fall at home. Percocet, morphine prn for pain. PT/OT recs SNF. Pt is agreeable to SNF    Right wrist fracture: secondary to fall at home. Continue w/ splint. Will likely need surgery outpatient as per ortho surg    Hypothyroidism: continue on home dose of levothyroxine    DM2: likely poorly controlled. Continue on SSI w/ accuchecks. Repeat HbA1c ordered   Peripheral neuropathy: continue on home dose of gabapentin    Dementia: with behavioral disturbance. Continue on home dose of namenda, aricept   GERD: continue on PPI    Anxiety: severity unknown. Continue on home dose of clonazepam   Depression: severity unknown. Continue on home dose of depakote, cymbalta       DVT prophylaxis: SCDs Code Status: full  Family Communication:  Disposition Plan: likely d/c to SNF   Level of care: Med-Surg  Status is: Observation The patient remains OBS appropriate and will d/c before 2 midnights.   Consultants:  Ortho surg   Procedures:   Antimicrobials:   Subjective: Pt c/o nausea   Objective: Vitals:   04/07/23 0453 04/07/23 0748 04/07/23 1547 04/07/23 2250  BP: (!) 142/59 (!) 134/52 (!) 141/45 (!) 138/49  Pulse: 61 61 (!) 59 65  Resp: Temp: 98.5 F (36.9 C) 98.3 F (36.8 C) 98.6 F (37 C) 98.3 F (36.8 C)  TempSrc:      SpO2: 99% 99% 98% 94%    Intake/Output Summary (Last 24 hours) at 04/08/2023 1478 Last data filed at 04/07/2023 1241 Gross per 24 hour   Intake --  Output 200 ml  Net -200 ml   There were no vitals filed for this visit.  Examination:  General exam: Appears calm but uncomfortable  Respiratory system: clear breath sounds b/l  Cardiovascular system: S1/S2+. No rubs or clicks  Gastrointestinal system: Abd is soft, NT, ND & hypoactive bowel sounds  Central nervous system: alert and oriented. Moves all extremities  Psychiatry: Judgement and insight is at baseline. Flat mood and affect     Data Reviewed: I have personally reviewed following labs and imaging studies  CBC: Recent Labs  Lab 04/06/23 2341 04/07/23 0515 04/08/23 0436  WBC 17.2* 10.2 7.7  NEUTROABS 14.2*  --   --   HGB 11.3* 10.7* 11.0*  HCT 34.8* 32.0* 34.5*  MCV 97.5 95.8 98.0  PLT 272 237 231   Basic Metabolic Panel: Recent Labs  Lab 04/06/23 2341 04/07/23 0515 04/08/23 0436  NA 135 135 137  K 4.2 3.8 4.1  CL 100 102 102  CO2 GLUCOSE 139* 98 124*  BUN CREATININE 1.32* 1.24* 1.23*  CALCIUM 8.9 9.0 8.7*   GFR: CrCl cannot be calculated (Unknown ideal weight.). Liver Function Tests: No results for input(s): "AST", "ALT", "ALKPHOS", "BILITOT", "PROT", "ALBUMIN" in the last 168 hours. No results for input(s): "LIPASE", "AMYLASE" in the last 168 hours. No results for input(s): "AMMONIA" in the last  168 hours. Coagulation Profile: No results for input(s): "INR", "PROTIME" in the last 168 hours. Cardiac Enzymes: No results for input(s): "CKTOTAL", "CKMB", "CKMBINDEX", "TROPONINI" in the last 168 hours. BNP (last 3 results) No results for input(s): "PROBNP" in the last 8760 hours. HbA1C: No results for input(s): "HGBA1C" in the last 72 hours. CBG: No results for input(s): "GLUCAP" in the last 168 hours. Lipid Profile: No results for input(s): "CHOL", "HDL", "LDLCALC", "TRIG", "CHOLHDL", "LDLDIRECT" in the last 72 hours. Thyroid Function Tests: No results for input(s): "TSH", "T4TOTAL", "FREET4", "T3FREE",  "THYROIDAB" in the last 72 hours. Anemia Panel: No results for input(s): "VITAMINB12", "FOLATE", "FERRITIN", "TIBC", "IRON", "RETICCTPCT" in the last 72 hours. Sepsis Labs: No results for input(s): "PROCALCITON", "LATICACIDVEN" in the last 168 hours.  No results found for this or any previous visit (from the past 240 hour(s)).       Radiology Studies: DG Wrist Complete Right  Result Date: 04/07/2023 CLINICAL DATA:  Wrist fracture status post reduction EXAM: RIGHT WRIST - COMPLETE 3+ VIEW COMPARISON:  04/06/2023 FINDINGS: Impacted intra-articular fracture of the distal radius. Slight improvement in the degree of angulation status post reduction. Similar degree of impaction and radial displacement of the radial styloid. Nondisplaced ulnar styloid fracture. No new fractures. No dislocation. Overlying splint material. IMPRESSION: Impacted intra-articular fracture of the distal radius with slight improvement in angulation status post reduction. Electronically Signed   By: Duanne Guess D.O.   On: 04/07/2023 09:23   CT PELVIS WO CONTRAST  Result Date: 04/07/2023 CLINICAL DATA:  Fall, right hip and pelvic pain EXAM: CT PELVIS WITHOUT CONTRAST TECHNIQUE: Multidetector CT imaging of the pelvis was performed following the standard protocol without intravenous contrast. RADIATION DOSE REDUCTION: This exam was performed according to the departmental dose-optimization program which includes automated exposure control, adjustment of the mA and/or kV according to patient size and/or use of iterative reconstruction technique. COMPARISON:  CT abdomen pelvis 11/15/2019 FINDINGS: Urinary Tract:  No abnormality visualized. Bowel: The visualized large and small bowel are unremarkable. Appendix absent. Trace free fluid within the pelvis, nonspecific. Vascular/Lymphatic: No pathologically enlarged lymph nodes. No significant vascular abnormality seen. Reproductive: Status post hysterectomy. No adnexal mass. Residual  ovaries are unremarkable. Other: Small subcutaneous hematoma within the soft tissues lateral to the right hip measuring up to 10 mm in diameter. Musculoskeletal: No acute bone abnormality. Specifically, no fracture or dislocation of the right hip. Degenerative changes are seen within the lumbosacral junction. IMPRESSION: 1. Small subcutaneous hematoma within the soft tissues lateral to the right hip measuring up to 10 mm in diameter. No fracture or dislocation of the right hip. 2. Trace free fluid within the pelvis, nonspecific. Electronically Signed   By: Helyn Numbers M.D.   On: 04/07/2023 02:05   CT Cervical Spine Wo Contrast  Result Date: 04/06/2023 CLINICAL DATA:  Fall EXAM: CT HEAD WITHOUT CONTRAST CT CERVICAL SPINE WITHOUT CONTRAST TECHNIQUE: Multidetector CT imaging of the head and cervical spine was performed following the standard protocol without intravenous contrast. Multiplanar CT image reconstructions of the cervical spine were also generated. RADIATION DOSE REDUCTION: This exam was performed according to the departmental dose-optimization program which includes automated exposure control, adjustment of the mA and/or kV according to patient size and/or use of iterative reconstruction technique. COMPARISON:  None Available. FINDINGS: CT HEAD FINDINGS Brain: There is no mass, hemorrhage or extra-axial collection. The size and configuration of the ventricles and extra-axial CSF spaces are normal. There is hypoattenuation of the periventricular white matter, most  commonly indicating chronic ischemic microangiopathy. Vascular: No abnormal hyperdensity of the major intracranial arteries or dural venous sinuses. No intracranial atherosclerosis. Skull: Right frontal scalp hematoma.  No skull fracture. Sinuses/Orbits: No fluid levels or advanced mucosal thickening of the visualized paranasal sinuses. No mastoid or middle ear effusion. The orbits are normal. CT CERVICAL SPINE FINDINGS Alignment: No static  subluxation. Facets are aligned. Occipital condyles are normally positioned. Skull base and vertebrae: No acute fracture. Soft tissues and spinal canal: No prevertebral fluid or swelling. No visible canal hematoma. Disc levels: No advanced spinal canal or neural foraminal stenosis. Upper chest: No pneumothorax, pulmonary nodule or pleural effusion. Other: Normal visualized paraspinal cervical soft tissues. IMPRESSION: 1. No acute intracranial abnormality. 2. Right frontal scalp hematoma without skull fracture. 3. No acute fracture or static subluxation of the cervical spine. Electronically Signed   By: Deatra Robinson M.D.   On: 04/06/2023 23:47   CT Head Wo Contrast  Result Date: 04/06/2023 CLINICAL DATA:  Fall EXAM: CT HEAD WITHOUT CONTRAST CT CERVICAL SPINE WITHOUT CONTRAST TECHNIQUE: Multidetector CT imaging of the head and cervical spine was performed following the standard protocol without intravenous contrast. Multiplanar CT image reconstructions of the cervical spine were also generated. RADIATION DOSE REDUCTION: This exam was performed according to the departmental dose-optimization program which includes automated exposure control, adjustment of the mA and/or kV according to patient size and/or use of iterative reconstruction technique. COMPARISON:  None Available. FINDINGS: CT HEAD FINDINGS Brain: There is no mass, hemorrhage or extra-axial collection. The size and configuration of the ventricles and extra-axial CSF spaces are normal. There is hypoattenuation of the periventricular white matter, most commonly indicating chronic ischemic microangiopathy. Vascular: No abnormal hyperdensity of the major intracranial arteries or dural venous sinuses. No intracranial atherosclerosis. Skull: Right frontal scalp hematoma.  No skull fracture. Sinuses/Orbits: No fluid levels or advanced mucosal thickening of the visualized paranasal sinuses. No mastoid or middle ear effusion. The orbits are normal. CT CERVICAL  SPINE FINDINGS Alignment: No static subluxation. Facets are aligned. Occipital condyles are normally positioned. Skull base and vertebrae: No acute fracture. Soft tissues and spinal canal: No prevertebral fluid or swelling. No visible canal hematoma. Disc levels: No advanced spinal canal or neural foraminal stenosis. Upper chest: No pneumothorax, pulmonary nodule or pleural effusion. Other: Normal visualized paraspinal cervical soft tissues. IMPRESSION: 1. No acute intracranial abnormality. 2. Right frontal scalp hematoma without skull fracture. 3. No acute fracture or static subluxation of the cervical spine. Electronically Signed   By: Deatra Robinson M.D.   On: 04/06/2023 23:47   DG Knee Complete 4 Views Right  Result Date: 04/06/2023 CLINICAL DATA:  Fall EXAM: RIGHT KNEE - COMPLETE 4+ VIEW COMPARISON:  None Available. FINDINGS: Early joint space narrowing and spurring. No acute bony abnormality. Specifically, no fracture, subluxation, or dislocation. No joint effusion. IMPRESSION: Early degenerative changes.  No acute bony abnormality. Electronically Signed   By: Charlett Nose M.D.   On: 04/06/2023 23:34   DG Forearm Right  Result Date: 04/06/2023 CLINICAL DATA:  Fall EXAM: RIGHT FOREARM - 2 VIEW COMPARISON:  Wrist series today FINDINGS: Age-indeterminate distal right radial fracture noted. This appears more acute on these images when compared to the wrist series. Therefore, I suspect this is an acute fracture. No ulnar abnormality. No subluxation or dislocation. IMPRESSION: Age indeterminate distal right radial fracture with more of an acute appearance on these images relative to the wrist series. Electronically Signed   By: Charlett Nose M.D.  On: 04/06/2023 23:34   DG Hips Bilat W or Wo Pelvis 2 Views  Result Date: 04/06/2023 CLINICAL DATA:  Fall, right hip pain EXAM: DG HIP (WITH OR WITHOUT PELVIS) 2V BILAT COMPARISON:  None Available. FINDINGS: There is no evidence of hip fracture or dislocation.  There is no evidence of arthropathy or other focal bone abnormality. IMPRESSION: Negative. Electronically Signed   By: Charlett Nose M.D.   On: 04/06/2023 23:33   DG Wrist Complete Right  Result Date: 04/06/2023 CLINICAL DATA:  Fall, deformity to right wrist EXAM: RIGHT WRIST - COMPLETE 3+ VIEW COMPARISON:  None Available. FINDINGS: Comminuted intra-articular fracture noted through the distal right radius. This is age indeterminate. Some areas appear to demonstrate evidence of healing suggesting this could be chronic/subacute. Fracture lines remain evident in some areas. No subluxation or dislocation. IMPRESSION: Comminuted intra-articular fracture through the distal right radius which is age indeterminate, possibly chronic or subacute. Recommend clinical correlation Electronically Signed   By: Charlett Nose M.D.   On: 04/06/2023 23:32        Scheduled Meds:  amLODipine  10 mg Oral Daily   ascorbic acid  250 mg Oral BID   calcium-vitamin D  1 tablet Oral Q breakfast   cholecalciferol  5,000 Units Oral Daily   clonazePAM  1 mg Oral QHS   donepezil  10 mg Oral QHS   feeding supplement  237 mL Oral TID BM   levothyroxine  50 mcg Oral Q0600   lipase/protease/amylase  36,000 Units Oral TID WC   loratadine  10 mg Oral Daily   metoprolol succinate  50 mg Oral Daily   multivitamin with minerals  1 tablet Oral Daily   pantoprazole  40 mg Oral Daily   psyllium  1 packet Oral Daily   Continuous Infusions:     LOS: 0 days    Time spent: 35 mins     Charise Killian, MD [facility] Pager [phone]  If 7PM-7AM, please contact night-coverage [email or web address] 04/08/2023, 8:08 AM

## 2023-04-08 NOTE — Progress Notes (Signed)
PT Cancellation Note  Patient Details Name: Valerie Hall MRN: [id number] DOB: October 21, 1951   Cancelled Treatment:    Reason Eval/Treat Not Completed: Patient at procedure or test/unavailable (Attempted to see patient twice for treatment, working with NSG both times. Will attempt again at later date/time as able.)  2:07 PM, 04/08/23 Rosamaria Lints, PT, DPT Physical Therapist - [facility]  [phone] (ASCOM)    Cinnamon Morency C 04/08/2023, 2:07 PM

## 2023-04-08 NOTE — NC FL2 (Signed)
[place] MEDICAID FL2 LEVEL OF CARE FORM     IDENTIFICATION  Patient Name: Valerie Hall Birthdate: [birth year] Sex: female Admission Date (Current Location): 04/06/2023  [facility] and [place] Number:  Chiropodist and Address:  [facility], [address] [place], [place] [address]      Provider Number: [id number]  Attending Physician Name and Address:  Charise Killian, MD  Relative Name and Phone Number:  Rosanne Gutting [phone]    Current Level of Care: Hospital Recommended Level of Care: Skilled Nursing Facility Prior Approval Number:    Date Approved/Denied:   PASRR Number: [id number]  Discharge Plan: SNF    Current Diagnoses: Patient Active Problem List   Diagnosis Date Noted   Hematoma of right hip 04/07/2023   Right wrist fracture 04/07/2023   GERD without esophagitis 04/07/2023   Type 2 diabetes mellitus with peripheral neuropathy 04/07/2023   Dementia with behavioral disturbance 04/07/2023   Fall 04/07/2023   Closed fracture of right distal radius 04/07/2023   AKI (acute kidney injury) 11/19/2019   Acute urinary retention 11/19/2019   Alzheimer's dementia without behavioral disturbance    Hypotension    Acute cystitis without hematuria    Pneumonia of right upper lobe due to infectious organism    Closed right ankle fracture, with routine healing, subsequent encounter    Slurred speech 11/15/2019   Acute metabolic encephalopathy    Lobar pneumonia    Essential hypertension    Acute kidney injury superimposed on CKD    Closed fracture of upper end of right fibula    Personal history of colonic polyps    Benign neoplasm of transverse colon    Auditory vertigo 04/30/2016   Irritable bowel syndrome with diarrhea 04/30/2016   Anxiety and depression 04/30/2016   Degeneration of intervertebral disc of cervical region 04/30/2016   Crohn's disease 04/30/2016   CFIDS (chronic fatigue and immune dysfunction  syndrome) 04/30/2016   Hypothyroidism    Head revolving around 12/29/2014   Cephalalgia 12/29/2014   Excessive falling 12/29/2014   Appendicular ataxia 12/29/2014   Neuritis or radiculitis due to rupture of lumbar intervertebral disc 07/13/2014   Bulge of lumbar disc without myelopathy 07/13/2014    Orientation RESPIRATION BLADDER Height & Weight     Self, Time, Situation, Place  Normal   Weight:   Height:     BEHAVIORAL SYMPTOMS/MOOD NEUROLOGICAL BOWEL NUTRITION STATUS        Diet (See DC summary)  AMBULATORY STATUS COMMUNICATION OF NEEDS Skin   Limited Assist Verbally Skin abrasions (Hematoma right hip)                       Personal Care Assistance Level of Assistance  Bathing, Feeding, Dressing Bathing Assistance: Maximum assistance Feeding assistance: Limited assistance Dressing Assistance: Maximum assistance     Functional Limitations Info  Sight, Hearing, Speech Sight Info: Adequate Hearing Info: Adequate Speech Info: Adequate    SPECIAL CARE FACTORS FREQUENCY  PT (By licensed PT), OT (By licensed OT)     PT Frequency: 5 times per week OT Frequency: 5 times per week            Contractures Contractures Info: Not present    Additional Factors Info  Code Status, Allergies Code Status Info: Full code Allergies Info: Codeine, Morphine And Related, Pork-derived Products, Prochlorperazine, Egg Solids, Whole           Current Medications (04/08/2023):  This is the current  hospital active medication list Current Facility-Administered Medications  Medication Dose Route Frequency Provider Last Rate Last Admin   acetaminophen (TYLENOL) tablet 650 mg  650 mg Oral Q6H PRN Mansy, Jan A, MD   650 mg at 04/08/23 1308   Or   acetaminophen (TYLENOL) suppository 650 mg  650 mg Rectal Q6H PRN Mansy, Jan A, MD       amLODipine (NORVASC) tablet 10 mg  10 mg Oral Daily Mansy, Jan A, MD   10 mg at 04/08/23 6578   ascorbic acid (VITAMIN C) tablet 250 mg  250 mg Oral  BID Mansy, Jan A, MD   250 mg at 04/08/23 4696   calcium-vitamin D (OSCAL WITH D) 500-5 MG-MCG per tablet 1 tablet  1 tablet Oral Q breakfast Mansy, Jan A, MD   1 tablet at 04/08/23 2952   cholecalciferol (VITAMIN D3) 25 MCG (1000 UNIT) tablet 5,000 Units  5,000 Units Oral Daily Mansy, Jan A, MD   5,000 Units at 04/08/23 0939   clonazePAM (KLONOPIN) tablet 1 mg  1 mg Oral QHS Mansy, Jan A, MD   1 mg at 04/07/23 2159   donepezil (ARICEPT) tablet 10 mg  10 mg Oral QHS Mansy, Jan A, MD   10 mg at 04/07/23 2158   feeding supplement (ENSURE ENLIVE / ENSURE PLUS) liquid 237 mL  237 mL Oral TID BM Mansy, Jan A, MD       levothyroxine (SYNTHROID) tablet 50 mcg  50 mcg Oral Q0600 Mansy, Jan A, MD   50 mcg at 04/08/23 8413   lipase/protease/amylase (CREON) capsule 36,000 Units  36,000 Units Oral TID WC Charise Killian, MD   36,000 Units at 04/08/23 1219   loratadine (CLARITIN) tablet 10 mg  10 mg Oral Daily Mansy, Jan A, MD   10 mg at 04/08/23 2440   magnesium hydroxide (MILK OF MAGNESIA) suspension 30 mL  30 mL Oral Daily PRN Mansy, Jan A, MD       metoprolol succinate (TOPROL-XL) 24 hr tablet 50 mg  50 mg Oral Daily Mansy, Jan A, MD   50 mg at 04/08/23 1027   morphine (PF) 2 MG/ML injection 2 mg  2 mg Intravenous Q4H PRN Mansy, Jan A, MD       multivitamin with minerals tablet 1 tablet  1 tablet Oral Daily Mansy, Jan A, MD   1 tablet at 04/08/23 0939   ondansetron (ZOFRAN) tablet 4 mg  4 mg Oral Q6H PRN Mansy, Jan A, MD       Or   ondansetron [facility]) injection 4 mg  4 mg Intravenous Q6H PRN Mansy, Jan A, MD   4 mg at 04/08/23 1013   oxyCODONE-acetaminophen (PERCOCET/ROXICET) 5-325 MG per tablet 1 tablet  1 tablet Oral Q6H PRN Charise Killian, MD   1 tablet at 04/08/23 1250   pantoprazole (PROTONIX) EC tablet 40 mg  40 mg Oral Daily Mansy, Jan A, MD   40 mg at 04/08/23 0939   psyllium (HYDROCIL/METAMUCIL) 1 packet  1 packet Oral Daily Mansy, Jan A, MD       traZODone (DESYREL) tablet 25 mg  25 mg  Oral QHS PRN Mansy, Jan A, MD   25 mg at 04/07/23 2200     Discharge Medications: Please see discharge summary for a list of discharge medications.  Relevant Imaging Results:  Relevant Lab Results:   Additional Information SS# [id number]  Marlowe Sax, RN

## 2023-04-08 NOTE — TOC Progression Note (Signed)
Transition of Care [facility]) - Progression Note    Patient Details  Name: Valerie Hall MRN: [id number] Date of Birth: 20-Nov-1951  Transition of Care [facility]) CM/SW Contact  Marlowe Sax, RN Phone Number: 04/08/2023, 3:11 PM  Clinical Narrative:     Spoke with the patient about going to STR, She is agreeable and understands that she will follow up outpatient for wrist surgery She has been to [facility] in the past and if they have a bed she would like to return, Bedsearch sent, PASSR obtained, fl2 completed Expected Discharge Plan: Skilled Nursing Facility Barriers to Discharge: SNF Pending bed offer, Insurance Authorization  Expected Discharge Plan and Services   Discharge Planning Services: CM Consult   Living arrangements for the past 2 months: Single Family Home                 DME Arranged: N/A DME Agency: NA       HH Arranged: NA           Social Determinants of Health (SDOH) Interventions SDOH Screenings   Food Insecurity: Food Insecurity Present (04/07/2023)  Housing: Low Risk  (04/07/2023)  Transportation Needs: No Transportation Needs (04/07/2023)  Utilities: At Risk (04/07/2023)  Tobacco Use: Low Risk  (04/07/2023)    Readmission Risk Interventions     No data to display

## 2023-04-08 NOTE — TOC Progression Note (Addendum)
Transition of Care [facility]) - Progression Note    Patient Details  Name: Valerie Hall MRN: [id number] Date of Birth: May 06, 1951  [facility] CM/SW Contact  Marlowe Sax, RN Phone Number: 04/08/2023, 3:31 PM  Clinical Narrative:    It was brought to my attention by Staff that The patient  is talking about Zollie Scale and Susy Frizzle (her family) taking over her place as well as their children/spouses. They are not allowing her to have a phone, and spending her money. She has called the authorities in the past, but has gotten no where. She is worried about returning home and unable to call anyone. She was forced to sign her grandson [employer] cabin crew as [place]. She's afraid to talk to anyone.  She is paranoid that they will find out she is talking to staff about it. She says they follow her throughout the house, even in the bathroom. She is not wanting them to be here when APS comes She has Dementia but is alert and oriented and answers questions appropriately  She reports to staff She eats cereal for breakfast, a banana sandwich for lunch if possible and usually a can of green beans for dinner. Zollie Scale and Susy Frizzle are not providing for her and get very upset with her if she asks any questions. She feels they will retaliate if they know she has spoken to anyone.  I called [facility] and after holding for several min I received a error message saying they are experiencing technical difficulty to call back another time I called right back and waiting on hold then got the same error message. I sent a secure email to the supervisor Hilma Favors and Lafayette Dragon at [facility] asking for assistance in this potential severe issue Spoke with APS and made referral, they will follow up and assess  Expected Discharge Plan: Skilled Nursing Facility Barriers to Discharge: SNF Pending bed offer, Insurance Authorization  Expected Discharge Plan and Services   Discharge Planning Services: CM Consult   Living arrangements for the  past 2 months: Single Family Home                 DME Arranged: N/A DME Agency: NA       HH Arranged: NA           Social Determinants of Health (SDOH) Interventions SDOH Screenings   Food Insecurity: Food Insecurity Present (04/07/2023)  Housing: Low Risk  (04/07/2023)  Transportation Needs: No Transportation Needs (04/07/2023)  Utilities: At Risk (04/07/2023)  Tobacco Use: Low Risk  (04/07/2023)    Readmission Risk Interventions     No data to display

## 2023-04-08 NOTE — Progress Notes (Signed)
Physical Therapy Treatment Patient Details Name: Valerie Hall MRN: [id number] DOB: [birth year] Today's Date: 04/08/2023   History of Present Illness Patient is a 72 year old female presenting after mechanical fall. Right forearm x-ray showed age-indeterminate right distal radial fracture s/p reduction of right wrist fracture with splint applied. Hematoma of R hip. PT consulted to assist with ambulation.    PT Comments    Pt received in bed, agreed to mobility. No current c/o nausea. Pt stated she feels "Oxy" helps better with her pain and "Percocet" tends to cause nausea. Pt required assistance to transfer to EOB, able to maintain good sitting balance. MinA to ambulate in room with R Platform RW ~4ft with assist to maneuver device and support balance due to R hip pain with wt acceptance. Pt is very cooperative and pleasant, appears very motivated to regain independent function. A+Ox4. She will benefit from continued skilled PT services upon d/c. Will continue to see pt acutely and address safety and concerns as needed with multidisciplinary team.    Recommendations for follow up therapy are one component of a multi-disciplinary discharge planning process, led by the attending physician.  Recommendations may be updated based on patient status, additional functional criteria and insurance authorization.  Follow Up Recommendations       Assistance Recommended at Discharge Intermittent Supervision/Assistance  Patient can return home with the following A lot of help with walking and/or transfers;A lot of help with bathing/dressing/bathroom;Assist for transportation;Help with stairs or ramp for entrance;Assistance with cooking/housework   Equipment Recommendations  Other (comment) (TBD at next facility)    Recommendations for Other Services       Precautions / Restrictions Precautions Precautions: Fall Required Braces or Orthoses: Splint/Cast Splint/Cast: RUE Splint/Cast - Date  Prophylactic Dressing Applied (if applicable):  (Awaiting out-pt surgery) Restrictions Weight Bearing Restrictions: Yes RUE Weight Bearing: Non weight bearing RLE Weight Bearing: Weight bearing as tolerated Other Position/Activity Restrictions: RUE NWBing through wrist     Mobility  Bed Mobility Overal bed mobility: Needs Assistance Bed Mobility: Supine to Sit     Supine to sit: Min assist, HOB elevated          Transfers Overall transfer level: Needs assistance Equipment used: Right platform walker, Rolling walker (2 wheels) Transfers: Sit to/from Stand Sit to Stand: Mod assist, From elevated surface           General transfer comment:  (ModA to transition to standing and attain balance with assist for R UE onto platform)    Ambulation/Gait Ambulation/Gait assistance: Min assist Gait Distance (Feet):  (15) Assistive device: Rolling walker (2 wheels), Right platform walker Gait Pattern/deviations: Step-to pattern, Decreased stance time - right, Decreased step length - left (needs assist to maneuver RW) Gait velocity:  (dec)     General Gait Details:  (Good progression with gait training, R hip discomfort with standing and R forearm tenderness while resting on platform)   Stairs             Wheelchair Mobility    Modified Rankin (Stroke Patients Only)       Balance Overall balance assessment: Needs assistance Sitting-balance support: No upper extremity supported, Feet supported Sitting balance-Leahy Scale: Good Sitting balance - Comments:  (Pt sat EOB x 5+ minutes with feet supported and Supervision)   Standing balance support: Bilateral upper extremity supported, During functional activity, Reliant on assistive device for balance Standing balance-Leahy Scale: Fair Standing balance comment:  (Balance affected by R hip pain in standing and R  UE limitations)                            Cognition Arousal/Alertness: Awake/alert Behavior  During Therapy: WFL for tasks assessed/performed Overall Cognitive Status: Within Functional Limits for tasks assessed                                 General Comments:  (Very pleasant, A&Ox4)        Exercises Other Exercises Other Exercises: Pt educated in positioning for RUE to support edema mgt, pillows positioned under RUE with pt noting improved comfort Other Exercises:  (Pt educated on role of PT and current wt bearing restrictions due to recent fx.)    General Comments General comments (skin integrity, edema, etc.): Pt without significant nausea this session after receiving medication. Long discussion with pt regarding current LOF, deficits, and POC.      Pertinent Vitals/Pain Pain Assessment Pain Assessment: 0-10 Pain Score: 5  Pain Location: R wrist, R hip(hematoma) Pain Descriptors / Indicators: Aching Pain Intervention(s): Limited activity within patient's tolerance    Home Living                          Prior Function            PT Goals (current goals can now be found in the care plan section) Acute Rehab PT Goals Patient Stated Goal: to go home Progress towards PT goals: Progressing toward goals    Frequency    7X/week      PT Plan      Co-evaluation              AM-PAC PT "6 Clicks" Mobility   Outcome Measure  Help needed turning from your back to your side while in a flat bed without using bedrails?: A Lot Help needed moving from lying on your back to sitting on the side of a flat bed without using bedrails?: A Lot Help needed moving to and from a bed to a chair (including a wheelchair)?: A Lot Help needed standing up from a chair using your arms (e.g., wheelchair or bedside chair)?: A Lot Help needed to walk in hospital room?: A Lot Help needed climbing 3-5 steps with a railing? : Total 6 Click Score: 11    End of Session Equipment Utilized During Treatment: Gait belt Activity Tolerance: Patient tolerated  treatment well Patient left: in chair;with call bell/phone within reach Nurse Communication: Mobility status;Other (comment) (concerns) PT Visit Diagnosis: Difficulty in walking, not elsewhere classified (R26.2);Pain Pain - Right/Left: Right Pain - part of body: Hip;Arm     Time: 1610-9604 PT Time Calculation (min) (ACUTE ONLY): 41 min  Charges:  $Gait Training: 8-22 mins $Therapeutic Activity: 23-37 mins                    Zadie Cleverly, PTA   Jannet Askew 04/08/2023, 5:09 PM

## 2023-04-08 NOTE — Evaluation (Signed)
Occupational Therapy Evaluation Patient Details Name: Valerie Hall MRN: [id number] DOB: 18-Nov-1951 Today's Date: 04/08/2023   History of Present Illness Patient is a 72 year old female presenting after mechanical fall. Right forearm x-ray showed age-indeterminate right distal radial fracture s/p reduction of right wrist fracture with splint applied. Hematoma of R hip. PT consulted to assist with ambulation.   Clinical Impression   Pt was seen for OT evaluation this date. Prior to hospital admission, pt was using a RW and living with family. Pt presents to acute OT demonstrating impaired ADL performance and functional mobility 2/2 R wrist NWBing in splint/cast, RLE WBAT, pain, impaired ROM/strength and balance (See OT problem list). Pt currently requires MIN-MOD A for UB bathing/dressing, MOD A for LB bathing/dressing, and PRN set up assist for grooming and self feeding using non-dom L hand. Pt educated in positioning for RUE to support edema mgt, pillows positioned under RUE with pt noting improved comfort. Pt educated in modifications for self feeding to improve satisfaction and ease while minimizing spillage while using non-dom L hand; OT facilitated ordering next meal while incorporating learned techniques. Pt became nauseated during session. Comfort measures provided, further ADL/mobility deferred, and RN notified. Pt would benefit from skilled OT services to address noted impairments and functional limitations (see below for any additional details) in order to maximize safety and independence while minimizing falls risk and caregiver burden. Upon hospital discharge, recommend additional skilled OT services to maximize pt safety and return to PLOF.    Recommendations for follow up therapy are one component of a multi-disciplinary discharge planning process, led by the attending physician.  Recommendations may be updated based on patient status, additional functional criteria and insurance  authorization.   Assistance Recommended at Discharge Frequent or constant Supervision/Assistance  Patient can return home with the following A lot of help with bathing/dressing/bathroom;A little help with bathing/dressing/bathroom;Assistance with cooking/housework;Help with stairs or ramp for entrance;Assist for transportation;Direct supervision/assist for medications management    Functional Status Assessment  Patient has had a recent decline in their functional status and demonstrates the ability to make significant improvements in function in a reasonable and predictable amount of time.  Equipment Recommendations  Other (comment) (defer to next venue, anticipate need for [facility])    Recommendations for Other Services       Precautions / Restrictions Precautions Precautions: Fall Required Braces or Orthoses: Splint/Cast Splint/Cast: RUE Restrictions Weight Bearing Restrictions: Yes RUE Weight Bearing: Non weight bearing RLE Weight Bearing: Weight bearing as tolerated Other Position/Activity Restrictions: RUE NWBing through wrist      Mobility Bed Mobility               General bed mobility comments: deferred, pt eating breakfast then became nauseated    Transfers                          Balance Overall balance assessment: Needs assistance Sitting-balance support: No upper extremity supported, Feet supported Sitting balance-Leahy Scale: Fair                                     ADL either performed or assessed with clinical judgement   ADL  General ADL Comments: Pt currently requires MIN-MOD A for UB bathing/dressing, MOD A for LB bathing/dressing, and PRN set up assist for grooming and self feeding using non-dom L hand.     Vision         Perception     Praxis      Pertinent Vitals/Pain Pain Assessment Pain Assessment: Faces Faces Pain Scale: Hurts little more Pain  Location: R wrist Pain Descriptors / Indicators: Aching Pain Intervention(s): Limited activity within patient's tolerance, Monitored during session, Repositioned     Hand Dominance Right   Extremity/Trunk Assessment Upper Extremity Assessment Upper Extremity Assessment: RUE deficits/detail RUE Deficits / Details: cast with ace wrap present. shoulder AROM WFL for functional activity. able to move digits but with pain RUE Sensation: WNL   Lower Extremity Assessment Lower Extremity Assessment: Defer to PT evaluation;RLE deficits/detail RLE Deficits / Details: pain with ROM of R hip. able to activate movement in hip/knee/ankle       Communication Communication Communication: No difficulties   Cognition Arousal/Alertness: Awake/alert Behavior During Therapy: WFL for tasks assessed/performed Overall Cognitive Status: Within Functional Limits for tasks assessed                                       General Comments  pt became nauseated mid-session, provided with comfort measures and RN notified, further ADL/mobility deferred    Exercises Other Exercises Other Exercises: Pt educated in positioning for RUE to support edema mgt, pillows positioned under RUE with pt noting improved comfort Other Exercises: Pt educated in modifications for self feeding to improve satisfaction and ease while minimizing spillage while using non-dom L hand; OT facilitated ordering next meal while incorporating learned techniques (e.g., soup in coffee mug with handle to sip from versus using spoon with L hand)   Shoulder Instructions      Home Living Family/patient expects to be discharged to:: Private residence Living Arrangements: Children;Other relatives Available Help at Discharge: Family;Available PRN/intermittently Type of Home: House Home Access: Stairs to enter [employer] of Steps: 4   Home Layout: One level     Bathroom Shower/Tub: Tub/shower unit         Home  Equipment: Rollator (4 wheels);BSC/3in1;Tub bench;Hand held shower head;Transport chair          Prior Functioning/Environment Prior Level of Function : Independent/Modified Independent             Mobility Comments: using 4 wheeled walker for ambulation ADLs Comments: Mod I        OT Problem List: Decreased strength;Pain;Decreased range of motion;Impaired balance (sitting and/or standing);Decreased knowledge of use of DME or AE;Impaired UE functional use;Decreased knowledge of precautions      OT Treatment/Interventions: Self-care/ADL training;Therapeutic exercise;Therapeutic activities;DME and/or AE instruction;Patient/family education;Balance training    OT Goals(Current goals can be found in the care plan section) Acute Rehab OT Goals Patient Stated Goal: get better OT Goal Formulation: With patient Time For Goal Achievement: 04/22/23 Potential to Achieve Goals: Good ADL Goals Pt Will Perform Upper Body Dressing: sitting;with set-up;with supervision Pt Will Perform Lower Body Dressing: with min assist Pt Will Transfer to Toilet: with min assist;bedside commode (LRAD) Pt Will Perform Toileting - Clothing Manipulation and hygiene: with set-up;with supervision Additional ADL Goal #1: Pt will independently utilize learned positioning techniques for RUE to support edema mgt and safety.  OT Frequency: Min 3X/week    Co-evaluation  AM-PAC OT "6 Clicks" Daily Activity     Outcome Measure Help from another person eating meals?: A Little Help from another person taking care of personal grooming?: A Little Help from another person toileting, which includes using toliet, bedpan, or urinal?: A Lot Help from another person bathing (including washing, rinsing, drying)?: A Lot Help from another person to put on and taking off regular upper body clothing?: A Little Help from another person to put on and taking off regular lower body clothing?: A Lot 6 Click Score:  15   End of Session Nurse Communication: Other (comment) (nausea)  Activity Tolerance: Other (comment) (limited by nausea) Patient left: in bed;with call bell/phone within reach;with bed alarm set  OT Visit Diagnosis: Other abnormalities of gait and mobility (R26.89);History of falling (Z91.81);Pain Pain - Right/Left: Right Pain - part of body: Hand;Hip                Time: 6295-2841 OT Time Calculation (min): 20 min Charges:  OT General Charges $OT Visit: 1 Visit OT Evaluation $OT Eval Moderate Complexity: 1 Mod OT Treatments $Therapeutic Activity: 8-22 mins  Arman Filter., MPH, MS, OTR/L ascom [phone] 04/08/23, 10:22 AM

## 2023-04-09 DIAGNOSIS — F03A18 Unspecified dementia, mild, with other behavioral disturbance: Secondary | ICD-10-CM | POA: Diagnosis present

## 2023-04-09 DIAGNOSIS — K219 Gastro-esophageal reflux disease without esophagitis: Secondary | ICD-10-CM | POA: Diagnosis present

## 2023-04-09 DIAGNOSIS — M810 Age-related osteoporosis without current pathological fracture: Secondary | ICD-10-CM | POA: Diagnosis present

## 2023-04-09 DIAGNOSIS — Z8673 Personal history of transient ischemic attack (TIA), and cerebral infarction without residual deficits: Secondary | ICD-10-CM | POA: Diagnosis not present

## 2023-04-09 DIAGNOSIS — D72829 Elevated white blood cell count, unspecified: Secondary | ICD-10-CM | POA: Diagnosis present

## 2023-04-09 DIAGNOSIS — W010XXA Fall on same level from slipping, tripping and stumbling without subsequent striking against object, initial encounter: Secondary | ICD-10-CM | POA: Diagnosis present

## 2023-04-09 DIAGNOSIS — Z8249 Family history of ischemic heart disease and other diseases of the circulatory system: Secondary | ICD-10-CM | POA: Diagnosis not present

## 2023-04-09 DIAGNOSIS — S62101A Fracture of unspecified carpal bone, right wrist, initial encounter for closed fracture: Secondary | ICD-10-CM | POA: Diagnosis not present

## 2023-04-09 DIAGNOSIS — M5030 Other cervical disc degeneration, unspecified cervical region: Secondary | ICD-10-CM | POA: Diagnosis present

## 2023-04-09 DIAGNOSIS — F03A3 Unspecified dementia, mild, with mood disturbance: Secondary | ICD-10-CM | POA: Diagnosis present

## 2023-04-09 DIAGNOSIS — S52501A Unspecified fracture of the lower end of right radius, initial encounter for closed fracture: Secondary | ICD-10-CM | POA: Diagnosis present

## 2023-04-09 DIAGNOSIS — Z79890 Hormone replacement therapy (postmenopausal): Secondary | ICD-10-CM | POA: Diagnosis not present

## 2023-04-09 DIAGNOSIS — F32A Depression, unspecified: Secondary | ICD-10-CM | POA: Diagnosis present

## 2023-04-09 DIAGNOSIS — K5090 Crohn's disease, unspecified, without complications: Secondary | ICD-10-CM | POA: Diagnosis present

## 2023-04-09 DIAGNOSIS — M25551 Pain in right hip: Secondary | ICD-10-CM | POA: Diagnosis present

## 2023-04-09 DIAGNOSIS — E039 Hypothyroidism, unspecified: Secondary | ICD-10-CM | POA: Diagnosis present

## 2023-04-09 DIAGNOSIS — E1142 Type 2 diabetes mellitus with diabetic polyneuropathy: Secondary | ICD-10-CM | POA: Diagnosis present

## 2023-04-09 DIAGNOSIS — M5126 Other intervertebral disc displacement, lumbar region: Secondary | ICD-10-CM | POA: Diagnosis present

## 2023-04-09 DIAGNOSIS — S7001XA Contusion of right hip, initial encounter: Secondary | ICD-10-CM | POA: Diagnosis present

## 2023-04-09 DIAGNOSIS — S0083XA Contusion of other part of head, initial encounter: Secondary | ICD-10-CM | POA: Diagnosis present

## 2023-04-09 DIAGNOSIS — F03A4 Unspecified dementia, mild, with anxiety: Secondary | ICD-10-CM | POA: Diagnosis present

## 2023-04-09 DIAGNOSIS — G9332 Myalgic encephalomyelitis/chronic fatigue syndrome: Secondary | ICD-10-CM | POA: Diagnosis present

## 2023-04-09 DIAGNOSIS — D649 Anemia, unspecified: Secondary | ICD-10-CM | POA: Diagnosis present

## 2023-04-09 DIAGNOSIS — Y92009 Unspecified place in unspecified non-institutional (private) residence as the place of occurrence of the external cause: Secondary | ICD-10-CM | POA: Diagnosis not present

## 2023-04-09 DIAGNOSIS — H8109 Meniere's disease, unspecified ear: Secondary | ICD-10-CM | POA: Diagnosis present

## 2023-04-09 DIAGNOSIS — I10 Essential (primary) hypertension: Secondary | ICD-10-CM | POA: Diagnosis present

## 2023-04-09 DIAGNOSIS — G894 Chronic pain syndrome: Secondary | ICD-10-CM | POA: Diagnosis present

## 2023-04-09 DIAGNOSIS — E119 Type 2 diabetes mellitus without complications: Secondary | ICD-10-CM | POA: Diagnosis not present

## 2023-04-09 DIAGNOSIS — T7691XA Unspecified adult maltreatment, suspected, initial encounter: Secondary | ICD-10-CM | POA: Diagnosis present

## 2023-04-09 LAB — BASIC METABOLIC PANEL
Anion gap: 8 (ref 5–15)
BUN: 10 mg/dL (ref 8–23)
CO2: 28 mmol/L (ref 22–32)
Calcium: 8.5 mg/dL — ABNORMAL LOW (ref 8.9–10.3)
Chloride: 97 mmol/L — ABNORMAL LOW (ref 98–111)
Creatinine, Ser: 1.09 mg/dL — ABNORMAL HIGH (ref 0.44–1.00)
GFR, Estimated: 54 mL/min — ABNORMAL LOW (ref >60)
Glucose, Bld: 121 mg/dL — ABNORMAL HIGH (ref 70–99)
Potassium: 3.7 mmol/L (ref 3.5–5.1)
Sodium: 133 mmol/L — ABNORMAL LOW (ref 135–145)

## 2023-04-09 LAB — CBC
HCT: 32.6 % — ABNORMAL LOW (ref 36.0–46.0)
Hemoglobin: 10.7 g/dL — ABNORMAL LOW (ref 12.0–15.0)
MCH: 31.7 pg (ref 26.0–34.0)
MCHC: 32.8 g/dL (ref 30.0–36.0)
MCV: 96.4 fL (ref 80.0–100.0)
Platelets: 225 10*3/uL (ref 150–400)
RBC: 3.38 MIL/uL — ABNORMAL LOW (ref 3.87–5.11)
RDW: 13.4 % (ref 11.5–15.5)
WBC: 8.4 10*3/uL (ref 4.0–10.5)
nRBC: 0 % (ref 0.0–0.2)

## 2023-04-09 LAB — HEMOGLOBIN A1C
Hgb A1c MFr Bld: 5.7 % — ABNORMAL HIGH (ref 4.8–5.6)
Mean Plasma Glucose: 116.89 mg/dL

## 2023-04-09 MED ORDER — DOCUSATE SODIUM 100 MG PO CAPS
200.0000 mg | ORAL_CAPSULE | Freq: Two times a day (BID) | ORAL | Status: DC
  Administered 2023-04-11 – 2023-04-12 (×2): 200 mg via ORAL
  Filled 2023-04-09 (×4): qty 2

## 2023-04-09 MED ORDER — ENOXAPARIN SODIUM 40 MG/0.4ML IJ SOSY
40.0000 mg | PREFILLED_SYRINGE | INTRAMUSCULAR | Status: DC
  Administered 2023-04-09 – 2023-04-12 (×4): 40 mg via SUBCUTANEOUS
  Filled 2023-04-09 (×4): qty 0.4

## 2023-04-09 MED ORDER — LISINOPRIL 10 MG PO TABS
10.0000 mg | ORAL_TABLET | Freq: Every day | ORAL | Status: DC
  Administered 2023-04-10 – 2023-04-12 (×3): 10 mg via ORAL
  Filled 2023-04-09 (×3): qty 1

## 2023-04-09 MED ORDER — METOCLOPRAMIDE HCL 5 MG/ML IJ SOLN
5.0000 mg | Freq: Four times a day (QID) | INTRAMUSCULAR | Status: DC
  Administered 2023-04-09 – 2023-04-10 (×4): 5 mg via INTRAVENOUS
  Filled 2023-04-09 (×4): qty 2

## 2023-04-09 NOTE — TOC Progression Note (Signed)
Transition of Care [facility]) - Progression Note    Patient Details  Name: Valerie Hall MRN: [id number] Date of Birth: 13-Jun-1951  [facility] CM/SW Contact  Marlowe Sax, RN Phone Number: 04/09/2023, 10:59 AM  Clinical Narrative:    Spoke with the patient and reviewed the bed offers, she chose [place] as she has been there in the past, Ins pending   Expected Discharge Plan: Skilled Nursing Facility Barriers to Discharge: SNF Pending bed offer, Insurance Authorization  Expected Discharge Plan and Services   Discharge Planning Services: CM Consult   Living arrangements for the past 2 months: Single Family Home                 DME Arranged: N/A DME Agency: NA       HH Arranged: NA           Social Determinants of Health (SDOH) Interventions SDOH Screenings   Food Insecurity: Food Insecurity Present (04/07/2023)  Housing: Low Risk  (04/07/2023)  Transportation Needs: No Transportation Needs (04/07/2023)  Utilities: At Risk (04/07/2023)  Tobacco Use: Low Risk  (04/07/2023)    Readmission Risk Interventions     No data to display

## 2023-04-09 NOTE — Progress Notes (Signed)
Physical Therapy Treatment Patient Details Name: Valerie Hall MRN: [id number] DOB: 02/22/1951 Today's Date: 04/09/2023   History of Present Illness Patient is a 72 year old female presenting after mechanical fall. Right forearm x-ray showed age-indeterminate right distal radial fracture s/p reduction of right wrist fracture with splint applied. Hematoma of R hip. PT consulted to assist with ambulation.    PT Comments    Pt c/o nausea but stated she has had 2 doses of nausea meds and is feeling better.  She is able to get to EOB with min a x 1 and increased time.  She stands with min a x 1 and progress gait in room to just past end of bed and opts to sit in chair after session.  Gait is slow and antalgic with R limp but no LOB or buckling noted.  Limited by pain.  She asks and is assisted with brushing her teeth and tasks in room.  Chooses to remain in chair with her feet down.  Needs in reach.   Recommendations for follow up therapy are one component of a multi-disciplinary discharge planning process, led by the attending physician.  Recommendations may be updated based on patient status, additional functional criteria and insurance authorization.  Follow Up Recommendations       Assistance Recommended at Discharge Intermittent Supervision/Assistance  Patient can return home with the following A lot of help with walking and/or transfers;A lot of help with bathing/dressing/bathroom;Assist for transportation;Help with stairs or ramp for entrance;Assistance with cooking/housework   Equipment Recommendations  Other (comment) (TBD at next facility)    Recommendations for Other Services       Precautions / Restrictions Precautions Precautions: Fall Required Braces or Orthoses: Splint/Cast Splint/Cast: RUE Splint/Cast - Date Prophylactic Dressing Applied (if applicable):  (Awaiting out-pt surgery) Restrictions Weight Bearing Restrictions: Yes RUE Weight Bearing: Non weight  bearing RLE Weight Bearing: Weight bearing as tolerated Other Position/Activity Restrictions: RUE NWBing through wrist     Mobility  Bed Mobility Overal bed mobility: Needs Assistance Bed Mobility: Supine to Sit     Supine to sit: Min assist, HOB elevated          Transfers Overall transfer level: Needs assistance Equipment used: Right platform walker, Rolling walker (2 wheels) Transfers: Sit to/from Stand Sit to Stand: Min assist, From elevated surface                Ambulation/Gait Ambulation/Gait assistance: Editor, commissioning (Feet): 20 Feet Assistive device: Rolling walker (2 wheels), Right platform walker Gait Pattern/deviations: Step-to pattern, Decreased stance time - right, Decreased step length - left Gait velocity: decreased     General Gait Details: slow and antalgic   Stairs             Wheelchair Mobility    Modified Rankin (Stroke Patients Only)       Balance Overall balance assessment: Needs assistance Sitting-balance support: No upper extremity supported, Feet supported Sitting balance-Leahy Scale: Good     Standing balance support: Bilateral upper extremity supported, During functional activity, Reliant on assistive device for balance Standing balance-Leahy Scale: Fair                              Cognition Arousal/Alertness: Awake/alert Behavior During Therapy: WFL for tasks assessed/performed Overall Cognitive Status: Within Functional Limits for tasks assessed  Exercises Other Exercises Other Exercises: assisted with ADL tasks per her request    General Comments        Pertinent Vitals/Pain Pain Assessment Pain Assessment: Faces Faces Pain Scale: Hurts little more Pain Location: R wrist, R hip(hematoma) Pain Descriptors / Indicators: Aching, Sore Pain Intervention(s): Limited activity within patient's tolerance, Monitored during  session, Repositioned, Premedicated before session    Home Living                          Prior Function            PT Goals (current goals can now be found in the care plan section) Progress towards PT goals: Progressing toward goals    Frequency    7X/week      PT Plan      Co-evaluation              AM-PAC PT "6 Clicks" Mobility   Outcome Measure  Help needed turning from your back to your side while in a flat bed without using bedrails?: A Little Help needed moving from lying on your back to sitting on the side of a flat bed without using bedrails?: A Little Help needed moving to and from a bed to a chair (including a wheelchair)?: A Little Help needed standing up from a chair using your arms (e.g., wheelchair or bedside chair)?: A Little Help needed to walk in hospital room?: A Little Help needed climbing 3-5 steps with a railing? : Total 6 Click Score: 16    End of Session Equipment Utilized During Treatment: Gait belt Activity Tolerance: Patient tolerated treatment well Patient left: in chair;with call bell/phone within reach Nurse Communication: Mobility status;Other (comment) (concerns) PT Visit Diagnosis: Difficulty in walking, not elsewhere classified (R26.2);Pain Pain - Right/Left: Right Pain - part of body: Hip;Arm     Time: 4098-1191 PT Time Calculation (min) (ACUTE ONLY): 22 min  Charges:  $Gait Training: 8-22 mins                   Danielle Dess, PTA 04/09/23, 2:09 PM
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
## 2023-04-09 NOTE — Plan of Care (Signed)

## 2023-04-10 DIAGNOSIS — S7001XA Contusion of right hip, initial encounter: Secondary | ICD-10-CM | POA: Diagnosis not present

## 2023-04-10 LAB — CBC
HCT: 32.9 % — ABNORMAL LOW (ref 36.0–46.0)
Hemoglobin: 10.6 g/dL — ABNORMAL LOW (ref 12.0–15.0)
MCH: 31.5 pg (ref 26.0–34.0)
MCHC: 32.2 g/dL (ref 30.0–36.0)
MCV: 97.6 fL (ref 80.0–100.0)
Platelets: 230 10*3/uL (ref 150–400)
RBC: 3.37 MIL/uL — ABNORMAL LOW (ref 3.87–5.11)
RDW: 13.3 % (ref 11.5–15.5)
WBC: 7.9 10*3/uL (ref 4.0–10.5)
nRBC: 0 % (ref 0.0–0.2)

## 2023-04-10 LAB — BASIC METABOLIC PANEL
Anion gap: 7 (ref 5–15)
BUN: 12 mg/dL (ref 8–23)
CO2: 29 mmol/L (ref 22–32)
Calcium: 9.1 mg/dL (ref 8.9–10.3)
Chloride: 95 mmol/L — ABNORMAL LOW (ref 98–111)
Creatinine, Ser: 1.25 mg/dL — ABNORMAL HIGH (ref 0.44–1.00)
GFR, Estimated: 46 mL/min — ABNORMAL LOW (ref >60)
Glucose, Bld: 110 mg/dL — ABNORMAL HIGH (ref 70–99)
Potassium: 4.4 mmol/L (ref 3.5–5.1)
Sodium: 131 mmol/L — ABNORMAL LOW (ref 135–145)

## 2023-04-10 MED ORDER — METOCLOPRAMIDE HCL 5 MG PO TABS
5.0000 mg | ORAL_TABLET | Freq: Three times a day (TID) | ORAL | Status: DC
  Administered 2023-04-10 – 2023-04-11 (×4): 5 mg via ORAL
  Filled 2023-04-10 (×4): qty 1

## 2023-04-10 NOTE — Progress Notes (Signed)
Occupational Therapy Treatment Patient Details Name: Valerie Hall MRN: [id number] DOB: 1951/06/17 Today's Date: 04/10/2023   History of present illness Patient is a 72 year old female presenting after mechanical fall. Right forearm x-ray showed age-indeterminate right distal radial fracture s/p reduction of right wrist fracture with splint applied. Hematoma of R hip. PT consulted to assist with ambulation.   OT comments  Pt received semi-reclined in bed. Appearing alert; willing to work with OT on grooming at EOB. T/f MIN A. See flowsheet below for further details of session. Left semi-reclined with all needs in reach.  Patient will benefit from continued OT while in acute care.    Recommendations for follow up therapy are one component of a multi-disciplinary discharge planning process, led by the attending physician.  Recommendations may be updated based on patient status, additional functional criteria and insurance authorization.    Assistance Recommended at Discharge Frequent or constant Supervision/Assistance  Patient can return home with the following  A lot of help with bathing/dressing/bathroom;A little help with bathing/dressing/bathroom;Assistance with cooking/housework;Help with stairs or ramp for entrance;Assist for transportation;Direct supervision/assist for medications management   Equipment Recommendations  Other (comment) (defer to next venue of care)    Recommendations for Other Services      Precautions / Restrictions Precautions Precautions: Fall Required Braces or Orthoses: Splint/Cast Splint/Cast: RUE Restrictions Weight Bearing Restrictions: Yes RUE Weight Bearing: Non weight bearing RLE Weight Bearing: Weight bearing as tolerated Other Position/Activity Restrictions: RUE NWBing through wrist; is allowed to use platform RW       Mobility Bed Mobility Overal bed mobility: Needs Assistance Bed Mobility: Supine to Sit, Sit to Supine     Supine to  sit: Min assist, HOB elevated Sit to supine: Mod assist        Transfers                   General transfer comment: Pt declining to t/f to standing today; states she's awaiting PTA for mobility later this morning.     Balance Overall balance assessment: Needs assistance Sitting-balance support: No upper extremity supported, Feet supported Sitting balance-Leahy Scale: Good                                     ADL either performed or assessed with clinical judgement   ADL Overall ADL's : Needs assistance/impaired     Grooming: Oral care;Minimal assistance;Brushing hair;Set up;Sitting Grooming Details (indicate cue type and reason): Needing assist for oral care from seated due to pt needing help holding cup or spit basin (to swap them out); otherwise set up. Pt using L hand only due to RUE deficits.             Lower Body Dressing: Total assistance Lower Body Dressing Details (indicate cue type and reason): for donning BIL socks   Toilet Transfer Details (indicate cue type and reason): Pt declining OT offer to t/f to [facility], even though she states she needs to urinate; requests to use bedpan instead; OT alerted NA.                Extremity/Trunk Assessment Upper Extremity Assessment Upper Extremity Assessment: RUE deficits/detail RUE Deficits / Details: cast with ace wrap present. shoulder AROM WFL for functional activity. able to move digits but with pain   Lower Extremity Assessment Lower Extremity Assessment: Defer to PT evaluation;Generalized weakness;RLE deficits/detail RLE Deficits / Details: pain with  ROM of R hip. able to activate movement in hip/knee/ankle        Vision       Perception     Praxis      Cognition Arousal/Alertness: Awake/alert Behavior During Therapy: WFL for tasks assessed/performed Overall Cognitive Status: Within Functional Limits for tasks assessed                                 General  Comments: Pleasant; able to note that PTA is returning later (and using PTA's name).        Exercises      Shoulder Instructions       General Comments Pt on room air. Left with RUE on pillow.    Pertinent Vitals/ Pain       Pain Assessment Pain Assessment: 0-10 Pain Score: 7  Pain Location: R wrist, R hip(hematoma) Pain Descriptors / Indicators: Aching, Sore Pain Intervention(s): Limited activity within patient's tolerance, Monitored during session  Home Living                                          Prior Functioning/Environment              Frequency  Min 3X/week        Progress Toward Goals  OT Goals(current goals can now be found in the care plan section)  Progress towards OT goals: Progressing toward goals  Acute Rehab OT Goals Patient Stated Goal: Get better OT Goal Formulation: With patient Time For Goal Achievement: 04/22/23 Potential to Achieve Goals: Good ADL Goals Pt Will Perform Upper Body Dressing: sitting;with set-up;with supervision Pt Will Perform Lower Body Dressing: with min assist Pt Will Transfer to Toilet: with min assist;bedside commode Pt Will Perform Toileting - Clothing Manipulation and hygiene: with set-up;with supervision Additional ADL Goal #1: Pt will independently utilize learned positioning techniques for RUE to support edema mgt and safety.  Plan Discharge plan remains appropriate    Co-evaluation                 AM-PAC OT "6 Clicks" Daily Activity     Outcome Measure   Help from another person eating meals?: A Little Help from another person taking care of personal grooming?: A Little Help from another person toileting, which includes using toliet, bedpan, or urinal?: A Lot Help from another person bathing (including washing, rinsing, drying)?: A Lot Help from another person to put on and taking off regular upper body clothing?: A Little Help from another person to put on and taking off  regular lower body clothing?: A Lot 6 Click Score: 15    End of Session  Left semi-reclined in bed.  OT Visit Diagnosis: Other abnormalities of gait and mobility (R26.89);History of falling (Z91.81);Pain   Activity Tolerance Patient limited by pain   Patient Left in bed;with call bell/phone within reach;with bed alarm set   Nurse Communication Other (comment) (Pt would like bath and use bedpan now)        Time: 1610-9604 OT Time Calculation (min): 14 min  Charges: OT General Charges $OT Visit: 1 Visit OT Treatments $Self Care/Home Management : 8-22 mins  Linward Foster, MS, OTR/L   Alvester Morin 04/10/2023, 12:17 PM

## 2023-04-10 NOTE — TOC Progression Note (Signed)
Transition of Care [facility]) - Progression Note    Patient Details  Name: Valerie Hall MRN: [id number] Date of Birth: 1951/04/10  Transition [facility] CM/SW Contact  Marlowe Sax, RN Phone Number: 04/10/2023, 3:40 PM  Clinical Narrative:     Ins pending to go to [place] rehab  Expected Discharge Plan: Skilled Nursing Facility Barriers to Discharge: SNF Pending bed offer, Insurance Authorization  Expected Discharge Plan and Services   Discharge Planning Services: CM Consult   Living arrangements for the past 2 months: Single Family Home                 DME Arranged: N/A DME Agency: NA       HH Arranged: NA           Social Determinants of Health (SDOH) Interventions SDOH Screenings   Food Insecurity: Food Insecurity Present (04/07/2023)  Housing: Low Risk  (04/07/2023)  Transportation Needs: No Transportation Needs (04/07/2023)  Utilities: At Risk (04/07/2023)  Tobacco Use: Low Risk  (04/07/2023)    Readmission Risk Interventions     No data to display

## 2023-04-10 NOTE — Progress Notes (Signed)
Physical Therapy Treatment Patient Details Name: Valerie Hall MRN: [id number] DOB: 10/18/1951 Today's Date: 04/10/2023   History of Present Illness Patient is a 72 year old female presenting after mechanical fall. Right forearm x-ray showed age-indeterminate right distal radial fracture s/p reduction of right wrist fracture with splint applied. Hematoma of R hip. PT consulted to assist with ambulation.    PT Comments    Author had attempted to see pt several time in the morning however pt c/o nausea and pain. She eventually agrees to session and was agreeable to OOB activity, however is somewhat self limiting. She is A and O but pain and fatigue limited. Required assistance to safely exit bed, stand to platform RW and ambulate ~ 30 ft to doorway and return two times. Overall pt is limited by pain and fatigue. Acute PT will continue to follow and progress as able per current POC.     Recommendations for follow up therapy are one component of a multi-disciplinary discharge planning process, led by the attending physician.  Recommendations may be updated based on patient status, additional functional criteria and insurance authorization.     Assistance Recommended at Discharge Intermittent Supervision/Assistance  Patient can return home with the following A little help with walking and/or transfers;A little help with bathing/dressing/bathroom;Assistance with cooking/housework;Direct supervision/assist for medications management;Direct supervision/assist for financial management;Assist for transportation;Help with stairs or ramp for entrance   Equipment Recommendations  Other (comment) (Defer to next level of care)       Precautions / Restrictions Precautions Precautions: Fall Required Braces or Orthoses: Splint/Cast Splint/Cast: RUE Restrictions Weight Bearing Restrictions: Yes RUE Weight Bearing: Non weight bearing RLE Weight Bearing: Weight bearing as tolerated Other  Position/Activity Restrictions: RUE NWBing through wrist; is allowed to use platform RW     Mobility  Bed Mobility Overal bed mobility: Needs Assistance Bed Mobility: Supine to Sit, Sit to Supine  Supine to sit: Min assist, HOB elevated Sit to supine: Mod assist   Transfers Overall transfer level: Needs assistance Equipment used: Right platform walker, Rolling walker (2 wheels) Transfers: Sit to/from Stand Sit to Stand: Min assist, From elevated surface      General transfer comment: pt was able to perform STS 3 x throughout session. Vcs for technique + nmin assist for safety to fully achieve standing    Ambulation/Gait Ambulation/Gait assistance: Min guard, Min assist Gait Distance (Feet): 30 Feet Assistive device: Rolling walker (2 wheels), Right platform walker Gait Pattern/deviations: Step-to pattern, Decreased stance time - right, Decreased step length - left Gait velocity: decreased     General Gait Details: slow, flexed and antalgic. Once fatigued required min assist for safety to make it back to EOB.    Balance Overall balance assessment: Needs assistance Sitting-balance support: No upper extremity supported, Feet supported Sitting balance-Leahy Scale: Good     Standing balance support: Bilateral upper extremity supported, During functional activity, Reliant on assistive device for balance Standing balance-Leahy Scale: Fair Standing balance comment: reliant on RW for dynamic standing activity         Cognition Arousal/Alertness: Awake/alert Behavior During Therapy: WFL for tasks assessed/performed Overall Cognitive Status: Within Functional Limits for tasks assessed      General Comments: pt is A and O x 4 but was somewhat self limiting during session. unwilling to ambulate out of room even when encouraged to do so.           General Comments General comments (skin integrity, edema, etc.): pt refused sitting in recliner. Required  extensive assistance to  return and reposition in bed post session.      Pertinent Vitals/Pain Pain Assessment Pain Assessment: 0-10 Pain Score: 4  Pain Location: R wrist, R hip Pain Descriptors / Indicators: Aching, Sore Pain Intervention(s): Limited activity within patient's tolerance, Monitored during session, Premedicated before session, Repositioned     PT Goals (current goals can now be found in the care plan section) Acute Rehab PT Goals Patient Stated Goal: rehab then home Progress towards PT goals: Progressing toward goals    Frequency    7X/week      PT Plan Current plan remains appropriate       AM-PAC PT "6 Clicks" Mobility   Outcome Measure  Help needed turning from your back to your side while in a flat bed without using bedrails?: A Little Help needed moving from lying on your back to sitting on the side of a flat bed without using bedrails?: A Little Help needed moving to and from a bed to a chair (including a wheelchair)?: A Little Help needed standing up from a chair using your arms (e.g., wheelchair or bedside chair)?: A Little Help needed to walk in hospital room?: A Little Help needed climbing 3-5 steps with a railing? : A Lot 6 Click Score: 17    End of Session   Activity Tolerance: Patient tolerated treatment well Patient left: in bed;with call bell/phone within reach;with bed alarm set Nurse Communication: Mobility status PT Visit Diagnosis: Difficulty in walking, not elsewhere classified (R26.2);Pain Pain - Right/Left: Right Pain - part of body: Hip;Arm     Time: 1140-1150 PT Time Calculation (min) (ACUTE ONLY): 10 min  Charges:  $Gait Training: 8-22 mins                     Jetta Lout PTA 04/10/23, 3:10 PM
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
## 2023-04-11 DIAGNOSIS — S7001XA Contusion of right hip, initial encounter: Secondary | ICD-10-CM | POA: Diagnosis not present

## 2023-04-11 LAB — CBC
HCT: 34.9 % — ABNORMAL LOW (ref 36.0–46.0)
Hemoglobin: 11.7 g/dL — ABNORMAL LOW (ref 12.0–15.0)
MCH: 32.1 pg (ref 26.0–34.0)
MCHC: 33.5 g/dL (ref 30.0–36.0)
MCV: 95.9 fL (ref 80.0–100.0)
Platelets: 256 10*3/uL (ref 150–400)
RBC: 3.64 MIL/uL — ABNORMAL LOW (ref 3.87–5.11)
RDW: 13.2 % (ref 11.5–15.5)
WBC: 8.9 10*3/uL (ref 4.0–10.5)
nRBC: 0 % (ref 0.0–0.2)

## 2023-04-11 LAB — BASIC METABOLIC PANEL
Anion gap: 10 (ref 5–15)
BUN: 13 mg/dL (ref 8–23)
CO2: 26 mmol/L (ref 22–32)
Calcium: 8.9 mg/dL (ref 8.9–10.3)
Chloride: 95 mmol/L — ABNORMAL LOW (ref 98–111)
Creatinine, Ser: 1.22 mg/dL — ABNORMAL HIGH (ref 0.44–1.00)
GFR, Estimated: 47 mL/min — ABNORMAL LOW (ref >60)
Glucose, Bld: 110 mg/dL — ABNORMAL HIGH (ref 70–99)
Potassium: 4 mmol/L (ref 3.5–5.1)
Sodium: 131 mmol/L — ABNORMAL LOW (ref 135–145)

## 2023-04-11 LAB — MAGNESIUM: Magnesium: 2.2 mg/dL (ref 1.7–2.4)

## 2023-04-11 MED ORDER — DOXEPIN HCL 10 MG PO CAPS
10.0000 mg | ORAL_CAPSULE | Freq: Every day | ORAL | Status: DC
  Administered 2023-04-11: 10 mg via ORAL
  Filled 2023-04-11: qty 1

## 2023-04-11 MED ORDER — METOCLOPRAMIDE HCL 5 MG PO TABS
5.0000 mg | ORAL_TABLET | Freq: Three times a day (TID) | ORAL | Status: DC | PRN
  Administered 2023-04-12: 5 mg via ORAL
  Filled 2023-04-11: qty 1

## 2023-04-11 MED ORDER — DULOXETINE HCL 30 MG PO CPEP
30.0000 mg | ORAL_CAPSULE | Freq: Every day | ORAL | Status: DC
  Administered 2023-04-12: 30 mg via ORAL
  Filled 2023-04-11 (×2): qty 1

## 2023-04-11 NOTE — Progress Notes (Signed)
Physical Therapy Treatment Patient Details Name: Valerie Hall MRN: [id number] DOB: 20-Jan-1951 Today's Date: 04/11/2023   History of Present Illness Patient is a 72 year old female presenting after mechanical fall. Right forearm x-ray showed age-indeterminate right distal radial fracture s/p reduction of right wrist fracture with splint applied. Hematoma of R hip. PT consulted to assist with ambulation.    PT Comments    Patient is agreeable to PT. She reports fatigue and nausea today. Patient continues to require occasional cues and physical assistance with transfers. Short distance ambulation in the room with the platform walker with occasional assistance required for rolling walker navigation. Recommend to continue PT to maximize independence and facilitate return to prior level of function.    Recommendations for follow up therapy are one component of a multi-disciplinary discharge planning process, led by the attending physician.  Recommendations may be updated based on patient status, additional functional criteria and insurance authorization.  Follow Up Recommendations       Assistance Recommended at Discharge Intermittent Supervision/Assistance  Patient can return home with the following A little help with walking and/or transfers;A little help with bathing/dressing/bathroom;Assistance with cooking/housework;Direct supervision/assist for medications management;Direct supervision/assist for financial management;Assist for transportation;Help with stairs or ramp for entrance   Equipment Recommendations   (to be determined at level of care)    Recommendations for Other Services       Precautions / Restrictions Precautions Precautions: Fall Restrictions Weight Bearing Restrictions: Yes RUE Weight Bearing: Non weight bearing (wrist) RLE Weight Bearing: Weight bearing as tolerated Other Position/Activity Restrictions: RUE NWBing through wrist; is allowed to use platform RW      Mobility  Bed Mobility Overal bed mobility: Needs Assistance Bed Mobility: Supine to Sit, Sit to Supine     Supine to sit: Min assist, HOB elevated Sit to supine: Min assist, HOB elevated   General bed mobility comments: verbal cues for technique. assistance for trunk support    Transfers Overall transfer level: Needs assistance Equipment used: Right platform walker, Rolling walker (2 wheels) Transfers: Sit to/from Stand Sit to Stand: Min assist, From elevated surface           General transfer comment: lifting assistance required for standing with cues for safety. 3 standing bouts performed with short rest breaks    Ambulation/Gait Ambulation/Gait assistance: Min guard, Min assist Gait Distance (Feet): 15 Feet Assistive device: Rolling walker (2 wheels), Right platform walker Gait Pattern/deviations: Step-to pattern, Decreased stance time - right, Decreased step length - left Gait velocity: decreased     General Gait Details: occasional assistance required for rolling walker negotiation, especially around turns   Optometrist    Modified Rankin (Stroke Patients Only)       Balance Overall balance assessment: Needs assistance Sitting-balance support: No upper extremity supported, Feet supported Sitting balance-Leahy Scale: Good     Standing balance support: Bilateral upper extremity supported, During functional activity, Reliant on assistive device for balance Standing balance-Leahy Scale: Fair                              Cognition Arousal/Alertness: Awake/alert Behavior During Therapy: WFL for tasks assessed/performed Overall Cognitive Status: Within Functional Limits for tasks assessed  Exercises      General Comments General comments (skin integrity, edema, etc.): standing tolerance limited by fatigue. assistance required for peri-hygiene after  bowel movement      Pertinent Vitals/Pain Pain Assessment Pain Assessment: Faces Faces Pain Scale: Hurts little more Pain Location: R wrist, R hip Pain Descriptors / Indicators: Aching, Sore Pain Intervention(s): Limited activity within patient's tolerance, Monitored during session, Repositioned    Home Living                          Prior Function            PT Goals (current goals can now be found in the care plan section) Acute Rehab PT Goals Patient Stated Goal: rehab then home PT Goal Formulation: With patient Time For Goal Achievement: 04/21/23 Potential to Achieve Goals: Fair Progress towards PT goals: Progressing toward goals    Frequency    7X/week      PT Plan Current plan remains appropriate    Co-evaluation              AM-PAC PT "6 Clicks" Mobility   Outcome Measure  Help needed turning from your back to your side while in a flat bed without using bedrails?: A Little Help needed moving from lying on your back to sitting on the side of a flat bed without using bedrails?: A Little Help needed moving to and from a bed to a chair (including a wheelchair)?: A Little Help needed standing up from a chair using your arms (e.g., wheelchair or bedside chair)?: A Little Help needed to walk in hospital room?: A Little Help needed climbing 3-5 steps with a railing? : A Lot 6 Click Score: 17    End of Session Equipment Utilized During Treatment: Gait belt Activity Tolerance: Patient tolerated treatment well Patient left: in bed;with call bell/phone within reach;with bed alarm set   PT Visit Diagnosis: Difficulty in walking, not elsewhere classified (R26.2);Pain Pain - Right/Left: Right Pain - part of body: Hip;Arm     Time: 1031-1056 PT Time Calculation (min) (ACUTE ONLY): 25 min  Charges:  $Therapeutic Activity: 23-37 mins                     Donna Bernard, PT, MPT   Ina Homes 04/11/2023, 1:08 PM

## 2023-04-11 NOTE — TOC Progression Note (Signed)
Transition of Care [facility]) - Progression Note    Patient Details  Name: Valerie Hall MRN: [id number] Date of Birth: Nov 16, 1951  [facility]) CM/SW Contact  Marlowe Sax, RN Phone Number: 04/11/2023, 2:03 PM  Clinical Narrative:     Insurance is still pending  Expected Discharge Plan: Skilled Nursing Facility Barriers to Discharge: SNF Pending bed offer, English as a second language teacher  Expected Discharge Plan and Services   Discharge Planning Services: CM Consult   Living arrangements for the past 2 months: Single Family Home                 DME Arranged: N/A DME Agency: NA       HH Arranged: NA           Social Determinants of Health (SDOH) Interventions SDOH Screenings   Food Insecurity: Food Insecurity Present (04/07/2023)  Housing: Low Risk  (04/07/2023)  Transportation Needs: No Transportation Needs (04/07/2023)  Utilities: At Risk (04/07/2023)  Tobacco Use: Low Risk  (04/07/2023)    Readmission Risk Interventions     No data to display
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
## 2023-04-12 DIAGNOSIS — S7001XA Contusion of right hip, initial encounter: Secondary | ICD-10-CM | POA: Diagnosis not present

## 2023-04-12 MED ORDER — ACETAMINOPHEN 325 MG PO TABS: 650.0000 mg | ORAL_TABLET | Freq: Four times a day (QID) | ORAL | Status: AC | PRN

## 2023-04-12 MED ORDER — DOCUSATE SODIUM 100 MG PO CAPS: 200.0000 mg | ORAL_CAPSULE | Freq: Two times a day (BID) | ORAL | 0 refills | Status: AC

## 2023-04-12 MED ORDER — OXYCODONE-ACETAMINOPHEN 5-325 MG PO TABS: 1.0000 | ORAL_TABLET | Freq: Three times a day (TID) | ORAL | 0 refills | Status: AC | PRN
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
## 2023-04-12 NOTE — Care Management Important Message (Signed)
Important Message  Patient Details  Name: Valerie Hall MRN: [id number] Date of Birth: 1951-10-01   Medicare Important Message Given:  Yes     Olegario Messier A Geraldine Tesar 04/12/2023, 2:02 PM
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
## 2023-04-12 NOTE — Progress Notes (Signed)
Occupational Therapy Treatment Patient Details Name: Valerie Hall MRN: [id number] DOB: Feb 20, 1951 Today's Date: 04/12/2023   History of present illness Patient is a 72 year old female presenting after mechanical fall. Right forearm x-ray showed age-indeterminate right distal radial fracture s/p reduction of right wrist fracture with splint applied. Hematoma of R hip. PT consulted to assist with ambulation.   OT comments  Pt seen for OT tx this date. Pt completed bed mobility with supervision and increased time/effort. Once EOB, pt required set up for grooming tasks and MIN A to brush back of head due to tangles. Pt verbalized difficulty using L hand as well as fatigue, endorsing 9/10 perceived rate of exertion by end of session. Pt declined further tx. Continues to benefit from skilled OT.    Recommendations for follow up therapy are one component of a multi-disciplinary discharge planning process, led by the attending physician.  Recommendations may be updated based on patient status, additional functional criteria and insurance authorization.    Assistance Recommended at Discharge Frequent or constant Supervision/Assistance  Patient can return home with the following  A lot of help with bathing/dressing/bathroom;A little help with bathing/dressing/bathroom;Assistance with cooking/housework;Help with stairs or ramp for entrance;Assist for transportation;Direct supervision/assist for medications management   Equipment Recommendations       Recommendations for Other Services      Precautions / Restrictions Precautions Precautions: Fall Required Braces or Orthoses: Splint/Cast Splint/Cast: RUE Restrictions Weight Bearing Restrictions: Yes RUE Weight Bearing: Non weight bearing RLE Weight Bearing: Weight bearing as tolerated Other Position/Activity Restrictions: RUE NWBing through wrist; is allowed to use platform RW       Mobility Bed Mobility Overal bed mobility: Needs  Assistance Bed Mobility: Supine to Sit, Sit to Supine     Supine to sit: Supervision Sit to supine: Supervision   General bed mobility comments: increased time/effort but no direct physical assist to complete    Transfers                   General transfer comment: declined 2/2 fatigue     Balance Overall balance assessment: Needs assistance Sitting-balance support: No upper extremity supported, Feet supported Sitting balance-Leahy Scale: Good                                     ADL either performed or assessed with clinical judgement   ADL       Grooming: Wash/dry face;Oral care;Brushing hair;Sitting Grooming Details (indicate cue type and reason): Pt sat EOB, requiring set up for toothbrush and items on the tray table placed in front of her. She required MIN A for combing tangles out of her hair. Pt verbalized difficulty using L hand as well as fatigue.                                    Extremity/Trunk Assessment              Vision       Perception     Praxis      Cognition Arousal/Alertness: Awake/alert Behavior During Therapy: WFL for tasks assessed/performed Overall Cognitive Status: Within Functional Limits for tasks assessed  Exercises      Shoulder Instructions       General Comments      Pertinent Vitals/ Pain       Pain Assessment Pain Assessment: No/denies pain  Home Living                                          Prior Functioning/Environment              Frequency  Min 3X/week        Progress Toward Goals  OT Goals(current goals can now be found in the care plan section)  Progress towards OT goals: Progressing toward goals  Acute Rehab OT Goals Patient Stated Goal: get better OT Goal Formulation: With patient Time For Goal Achievement: 04/22/23 Potential to Achieve Goals: Good  Plan Discharge plan  remains appropriate;Frequency remains appropriate    Co-evaluation                 AM-PAC OT "6 Clicks" Daily Activity     Outcome Measure   Help from another person eating meals?: A Little Help from another person taking care of personal grooming?: A Little Help from another person toileting, which includes using toliet, bedpan, or urinal?: A Lot Help from another person bathing (including washing, rinsing, drying)?: A Lot Help from another person to put on and taking off regular upper body clothing?: A Little Help from another person to put on and taking off regular lower body clothing?: A Lot 6 Click Score: 15    End of Session    OT Visit Diagnosis: Other abnormalities of gait and mobility (R26.89);History of falling (Z91.81);Pain Pain - Right/Left: Right Pain - part of body: Hand;Hip   Activity Tolerance Patient tolerated treatment well   Patient Left in bed;with call bell/phone within reach;with bed alarm set   Nurse Communication          Time: 1610-9604 OT Time Calculation (min): 14 min  Charges: OT General Charges $OT Visit: 1 Visit OT Treatments $Self Care/Home Management : 8-22 mins  Arman Filter., MPH, MS, OTR/L ascom [phone] 04/12/23, 12:01 PM

## 2023-04-12 NOTE — TOC Progression Note (Signed)
Transition of Care [facility]) - Progression Note    Patient Details  Name: Valerie Hall MRN: [id number] Date of Birth: [birth year]  Transition of Care [facility]) CM/SW Contact  Marlowe Sax, RN Phone Number: 04/12/2023, 10:24 AM  Clinical Narrative:    Ins approved  4/26 - 5/8.  Cert: [id number]  Expected Discharge Plan: Skilled Nursing Facility Barriers to Discharge: SNF Pending bed offer, Insurance Authorization  Expected Discharge Plan and Services   Discharge Planning Services: CM Consult   Living arrangements for the past 2 months: Single Family Home                 DME Arranged: N/A DME Agency: NA       HH Arranged: NA           Social Determinants of Health (SDOH) Interventions SDOH Screenings   Food Insecurity: Food Insecurity Present (04/07/2023)  Housing: Low Risk  (04/07/2023)  Transportation Needs: No Transportation Needs (04/07/2023)  Utilities: At Risk (04/07/2023)  Tobacco Use: Low Risk  (04/07/2023)    Readmission Risk Interventions     No data to display
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
## 2023-09-20 ENCOUNTER — Emergency Department: Payer: Medicare HMO

## 2023-09-20 ENCOUNTER — Emergency Department
Admission: EM | Admit: 2023-09-20 | Discharge: 2023-09-21 | Disposition: A | Discharge status: Home / Self Care | Payer: Medicare HMO | Attending: Emergency Medicine | Admitting: Emergency Medicine

## 2023-09-20 ENCOUNTER — Other Ambulatory Visit: Payer: Self-pay

## 2023-09-20 DIAGNOSIS — I6782 Cerebral ischemia: Secondary | ICD-10-CM | POA: Diagnosis not present

## 2023-09-20 DIAGNOSIS — K589 Irritable bowel syndrome without diarrhea: Secondary | ICD-10-CM | POA: Diagnosis not present

## 2023-09-20 DIAGNOSIS — R519 Headache, unspecified: Secondary | ICD-10-CM | POA: Insufficient documentation

## 2023-09-20 DIAGNOSIS — K219 Gastro-esophageal reflux disease without esophagitis: Secondary | ICD-10-CM | POA: Diagnosis not present

## 2023-09-20 DIAGNOSIS — Z79899 Other long term (current) drug therapy: Secondary | ICD-10-CM | POA: Diagnosis not present

## 2023-09-20 DIAGNOSIS — I10 Essential (primary) hypertension: Secondary | ICD-10-CM | POA: Diagnosis present

## 2023-09-20 DIAGNOSIS — H5702 Anisocoria: Secondary | ICD-10-CM | POA: Insufficient documentation

## 2023-09-20 LAB — CBC
HCT: 36.6 % (ref 36.0–46.0)
Hemoglobin: 11.8 g/dL — ABNORMAL LOW (ref 12.0–15.0)
MCH: 31.4 pg (ref 26.0–34.0)
MCHC: 32.2 g/dL (ref 30.0–36.0)
MCV: 97.3 fL (ref 80.0–100.0)
Platelets: 253 10*3/uL (ref 150–400)
RBC: 3.76 MIL/uL — ABNORMAL LOW (ref 3.87–5.11)
RDW: 13.6 % (ref 11.5–15.5)
WBC: 8.6 10*3/uL (ref 4.0–10.5)
nRBC: 0 % (ref 0.0–0.2)

## 2023-09-20 LAB — BASIC METABOLIC PANEL
Anion gap: 11 (ref 5–15)
BUN: 12 mg/dL (ref 8–23)
CO2: 24 mmol/L (ref 22–32)
Calcium: 8.9 mg/dL (ref 8.9–10.3)
Chloride: 106 mmol/L (ref 98–111)
Creatinine, Ser: 1.27 mg/dL — ABNORMAL HIGH (ref 0.44–1.00)
GFR, Estimated: 44 mL/min — ABNORMAL LOW (ref >60)
Glucose, Bld: 104 mg/dL — ABNORMAL HIGH (ref 70–99)
Potassium: 3.6 mmol/L (ref 3.5–5.1)
Sodium: 141 mmol/L (ref 135–145)

## 2023-09-20 LAB — SEDIMENTATION RATE: Sed Rate: 38 mm/h — ABNORMAL HIGH (ref 0–30)

## 2023-09-20 MED ORDER — FENTANYL CITRATE PF 50 MCG/ML IJ SOSY
50.0000 ug | PREFILLED_SYRINGE | Freq: Once | INTRAMUSCULAR | Status: AC
  Administered 2023-09-20: 50 ug via INTRAVENOUS
  Filled 2023-09-20: qty 1

## 2023-09-20 MED ORDER — IOHEXOL 350 MG/ML SOLN
75.0000 mL | Freq: Once | INTRAVENOUS | Status: AC | PRN
  Administered 2023-09-20: 75 mL via INTRAVENOUS

## 2023-09-20 MED ORDER — ACETAMINOPHEN 500 MG PO TABS
1000.0000 mg | ORAL_TABLET | Freq: Once | ORAL | Status: AC
  Administered 2023-09-20: 1000 mg via ORAL
  Filled 2023-09-20: qty 2

## 2023-09-20 MED ORDER — CLONIDINE HCL 0.1 MG PO TABS
0.2000 mg | ORAL_TABLET | Freq: Once | ORAL | Status: AC
  Administered 2023-09-20: 0.2 mg via ORAL
  Filled 2023-09-20: qty 2

## 2023-09-20 NOTE — ED Triage Notes (Signed)
Pt comes with c/o HTN via EMS. Pt states she has had this going on for about 2 weeks. Pt states she has BP meds increased recently. Pt states headache today.pt denies any dizziness. Pt states pressure and head in right and frontal temple that is shooting.

## 2023-09-20 NOTE — ED Triage Notes (Signed)
Ems report - pt ems from home for HTN and headache. Pt has had HA for 2 weeks. Ems gave 1 spray nitroglycerin. Pt reported HA better post. Ems reports pts pupils unequal on arrival to ED. Pt on phone in lobby. No deficits noted.

## 2023-09-20 NOTE — ED Notes (Signed)
Recollect green sent to lab 

## 2023-09-20 NOTE — ED Provider Triage Note (Signed)
Emergency Medicine Provider Triage Evaluation Note  Darene Lamer , a 72 y.o. female  was evaluated in triage.  Pt complains of hypertension, doctor recently increased her BP meds. She reports some readings in the 200s at the fire station. She states she has had a headache.  Review of Systems  Positive: headache Negative: Blurry vision, chest pain, difficulty breathing  Physical Exam  BP (!) 193/96   Pulse 84   Temp 98 F (36.7 C)   Resp 18   Ht 5\' 3"  (1.6 m)   Wt 78.9 kg   SpO2 100%   BMI 30.82 kg/m  Gen:   Awake, no distress   Resp:  Normal effort  MSK:   Moves extremities without difficulty   Medical Decision Making  Medically screening exam initiated at 4:38 PM.  Appropriate orders placed.  Darene Lamer was informed that the remainder of the evaluation will be completed by another provider, this initial triage assessment does not replace that evaluation, and the importance of remaining in the ED until their evaluation is complete.     Cameron Ali, PA-C 09/20/23 1640

## 2023-09-20 NOTE — ED Provider Notes (Signed)
-----------------------------------------   8:31 PM on 09/20/2023 -----------------------------------------  Blood pressure (!) 168/86, pulse 82, temperature 98.1 F (36.7 C), resp. rate 18, height 5\' 3"  (1.6 m), weight 78.9 kg, SpO2 99%.  Assuming care from Dr. Marisa Severin.  In short, Valerie Hall is a 72 y.o. female with a chief complaint of Hypertension .  Refer to the original H&P for additional details.  The current plan of care is to await CT angio head and neck, ESR.  Patient has hypertension, right sided temporal headache.  Neurology was consulted, recommended CT angio head and neck as well as an ESR.  If ESR is elevated, suspect temporal arteritis and neurology recommends admission.  The patient has findings concerning for aneurysm neurology also recommends admission.  If CT angio head and neck and ESR are reassuring patient may be discharged.[name]   ----------------------------------------- 11:41 PM on 09/20/2023 ----------------------------------------- Patient's ESR is below 40, CT angio with no evidence of aneurysm.  Patient's hypertension has returned into her normal range.  At this time we discussed management of hypertension and recommend having a blood pressure journal over the next several days to several weeks.  If patient continues to run elevated discussed with the primary care changing or adjusting medications.  If patient has acute elevation of the symptoms she may always return to the emergency department.  At this time patient is stable for discharge.   ED diagnosis:  Headache Hypertension Unequal pupils   Lanette Hampshire 09/20/23 2342    Dionne Bucy, MD 09/22/23 [phone]

## 2023-09-20 NOTE — ED Provider Notes (Signed)
[facility] Provider Note    Event Date/Time   First MD Initiated Contact with Patient 09/20/23 1826     (approximate)   History   Hypertension   HPI  Valerie Hall is a 72 y.o. female with a history of hypertension, pancreatic insufficiency, GERD, IBS, Mnire's disease, anxiety, and depression who presents with concern for elevated blood pressure over the last couple weeks.  The patient states that she had blood pressure readings in the 190s.  Her doctor went up on one of her blood pressure medication doses, but then she had readings in the 170s.  Today she went to the fire department and had a reading that she believes is over 200.  The patient has also had a right-sided headache behind the right eye and in the right temple.  It is not associated with any blurred vision.  The patient has no double vision.  She states that when EMS came they noted that her pupils were unequal.  The patient has had migraines previously and this headache is somewhat similar.  She denies any chest pain or difficulty breathing.  I reviewed the past medical records.  The patient was seen by geriatric medicine on 9/24 at which time her lisinopril was increased from 10 to 20 mg.   Physical Exam   Triage Vital Signs: ED Triage Vitals  Encounter Vitals Group     BP 09/20/23 1632 (!) 193/96     Systolic BP Percentile --      Diastolic BP Percentile --      Pulse Rate 09/20/23 1632 84     Resp 09/20/23 1632 18     Temp 09/20/23 1632 98 F (36.7 C)     Temp src --      SpO2 09/20/23 1632 100 %     Weight 09/20/23 1634 174 lb (78.9 kg)     Height 09/20/23 1634 5\' 3"  (1.6 m)     Head Circumference --      Peak Flow --      Pain Score --      Pain Loc --      Pain Education --      Exclude from Growth Chart --     Most recent vital signs: Vitals:   09/20/23 1632 09/20/23 1926  BP: (!) 193/96 (!) 168/86  Pulse: 84 82  Resp: 18 18  Temp: 98 F (36.7 C) 98.1 F (36.7  C)  SpO2: 100% 99%     General: Awake, no distress.  CV:  Good peripheral perfusion.  Resp:  Normal effort.  Abd:  No distention.  Other:  Right pupil 4 mm, left pupil 6 mm.  Both pupils reactive.  EOMI.  No facial droop.  Normal speech.  Motor and sensory intact in all extremities.  No pronator drift.  No ataxia.  Mild right temporal tenderness.   ED Results / Procedures / Treatments   Labs (all labs ordered are listed, but only abnormal results are displayed) Labs Reviewed  CBC - Abnormal; Notable for the following components:      Result Value   RBC 3.76 (*)    Hemoglobin 11.8 (*)    All other components within normal limits  BASIC METABOLIC PANEL - Abnormal; Notable for the following components:   Glucose, Bld 104 (*)    Creatinine, Ser 1.27 (*)    GFR, Estimated 44 (*)    All other components within normal limits  SEDIMENTATION RATE  C-REACTIVE PROTEIN  EKG    RADIOLOGY  CT head: I independently viewed and interpreted the images; there is no ICH.  Radiology report indicates no acute abnormality.  CT angio head/neck: Pending   PROCEDURES:  Critical Care performed: No  Procedures   MEDICATIONS ORDERED IN ED: Medications  fentaNYL (SUBLIMAZE) injection 50 mcg (has no administration in time range)  acetaminophen (TYLENOL) tablet 1,000 mg (1,000 mg Oral Given 09/20/23 1924)  cloNIDine (CATAPRES) tablet 0.2 mg (0.2 mg Oral Given 09/20/23 1923)     IMPRESSION / MDM / ASSESSMENT AND PLAN / ED COURSE  I reviewed the triage vital signs and the nursing notes.  72 year old female with PMH as noted above presents with elevated blood pressure readings over the last few weeks despite increasing the dose of her lisinopril, as well as right-sided headache and temporal area pain, and unequal pupils.  The patient states that she has not noted her pupils to be unequal previously.  Thorough neurologic exam reveals no other neurologic deficits.  She has no diplopia or  blurred vision.  Differential diagnosis includes, but is not limited to, hypertensive urgency, hypertensive emergency with end organ dysfunction, acute CVA, TIA, migraine, aneurysm, giant cell arteritis.  Patient's presentation is most consistent with acute presentation with potential threat to life or bodily function.  The patient is on the cardiac monitor to evaluate for evidence of arrhythmia and/or significant heart rate changes.  Initial labs are unremarkable.  Creatinine is minimally elevated but at baseline for the patient.  There is no leukocytosis or anemia on the CBC.  CT head is negative for acute findings.  I consulted and discussed the case with Dr. Amada Jupiter from neurology.  He recommends obtaining a CTA to rule out vascular etiology such as an aneurysm, and also would obtain an ESR and CRP to help out GCA.  He advises that the unequal pupils are a nonspecific finding and if the CTA is negative and ESR is not elevated above 40, he would recommend outpatient follow-up for likely migraine headache.  If either of these is abnormal he would recommend admission for further workup.  ----------------------------------------- 8:08 PM on 09/20/2023 -----------------------------------------  CTA and ESR are pending.  I am signing the patient out to the oncoming ED provider.    FINAL CLINICAL IMPRESSION(S) / ED DIAGNOSES   Final diagnoses:  Acute nonintractable headache, unspecified headache type  Hypertension, unspecified type  Unequal pupils     Rx / DC Orders   ED Discharge Orders     None        Note:  This document was prepared using Dragon voice recognition software and may include unintentional dictation errors.    Dionne Bucy, MD 09/20/23 2009

## 2023-09-21 LAB — C-REACTIVE PROTEIN: CRP: 0.7 mg/dL (ref <1.0)

## 2024-01-18 ENCOUNTER — Other Ambulatory Visit: Payer: Self-pay

## 2024-01-18 DIAGNOSIS — M545 Low back pain, unspecified: Secondary | ICD-10-CM | POA: Diagnosis present

## 2024-01-18 DIAGNOSIS — G8929 Other chronic pain: Secondary | ICD-10-CM | POA: Insufficient documentation

## 2024-01-18 DIAGNOSIS — R200 Anesthesia of skin: Secondary | ICD-10-CM | POA: Diagnosis not present

## 2024-01-18 DIAGNOSIS — M48061 Spinal stenosis, lumbar region without neurogenic claudication: Secondary | ICD-10-CM | POA: Diagnosis not present

## 2024-01-18 DIAGNOSIS — N3000 Acute cystitis without hematuria: Secondary | ICD-10-CM | POA: Insufficient documentation

## 2024-01-18 DIAGNOSIS — K0889 Other specified disorders of teeth and supporting structures: Secondary | ICD-10-CM | POA: Insufficient documentation

## 2024-01-18 LAB — URINALYSIS, ROUTINE W REFLEX MICROSCOPIC
Bilirubin Urine: NEGATIVE
Glucose, UA: NEGATIVE mg/dL
Hgb urine dipstick: NEGATIVE
Ketones, ur: NEGATIVE mg/dL
Leukocytes,Ua: NEGATIVE
Nitrite: POSITIVE — AB
Protein, ur: NEGATIVE mg/dL
Specific Gravity, Urine: 1.015 (ref 1.005–1.030)
pH: 5 (ref 5.0–8.0)

## 2024-01-18 NOTE — ED Triage Notes (Signed)
First RN Note: Pt to ED via [facility] with c/o lower back pain that radiates to her hips. Pt has hx of same x approx 10 yrs, has been increasing x 2 days. Per EMS pt gets epidural treatments, however most recent appt was postponed.   97% RA 205/76  Per EMS pt took 2-3 extra strength tylenol throughout the day. Pt appears uncomfortable on arrival to ED, however is speaking on cell phone on arrival.

## 2024-01-18 NOTE — ED Provider Triage Note (Signed)
Emergency Medicine Provider Triage Evaluation Note  Valerie Hall , a 73 y.o. female  was evaluated in triage.  Pt complains of lower back pain x 2 days after moving a bed. Hx of chronic back pain. Dx w/ UTI yesterday on keflex.   Review of Systems  Positive:  Negative: Loss of bowel and bladder control   Physical Exam  BP (!) 177/69   Pulse 78   Temp 97.8 F (36.6 C) (Oral)   Resp 18   SpO2 95%  Gen:   Awake, no distress   Resp:  Normal effort  MSK:   Moves extremities without difficulty  Other:    Medical Decision Making  Medically screening exam initiated at 9:34 PM.  Appropriate orders placed.  Valerie Hall was informed that the remainder of the evaluation will be completed by another provider, this initial triage assessment does not replace that evaluation, and the importance of remaining in the ED until their evaluation is complete.     Romeo Apple, Colen Eltzroth A, PA-C 01/18/24 2135

## 2024-01-18 NOTE — ED Triage Notes (Signed)
Pt BIB [facility] lower back pain that radiates to her hips. Pt has hx of same x approx 10 yrs with worsening x 2 days after moving bed. Per EMS pt gets epidural treatments, however most recent appt was postponed until Feb 10. Pt has taken Tylenol with no relief. Pt has history of UTIs and started Keflex yesterday. She does endorse pain and burning with urination x 2 weeks that has progressively worsened.

## 2024-01-19 ENCOUNTER — Emergency Department: Payer: Medicare (Managed Care)

## 2024-01-19 ENCOUNTER — Emergency Department
Admission: EM | Admit: 2024-01-19 | Discharge: 2024-01-19 | Disposition: A | Discharge status: Home / Self Care | Payer: Medicare (Managed Care) | Attending: Emergency Medicine | Admitting: Emergency Medicine

## 2024-01-19 DIAGNOSIS — N3000 Acute cystitis without hematuria: Secondary | ICD-10-CM

## 2024-01-19 DIAGNOSIS — E871 Hypo-osmolality and hyponatremia: Secondary | ICD-10-CM

## 2024-01-19 DIAGNOSIS — G8929 Other chronic pain: Secondary | ICD-10-CM

## 2024-01-19 LAB — BASIC METABOLIC PANEL
Anion gap: 12 (ref 5–15)
BUN: 17 mg/dL (ref 8–23)
CO2: 24 mmol/L (ref 22–32)
Calcium: 9.5 mg/dL (ref 8.9–10.3)
Chloride: 89 mmol/L — ABNORMAL LOW (ref 98–111)
Creatinine, Ser: 1.24 mg/dL — ABNORMAL HIGH (ref 0.44–1.00)
GFR, Estimated: 46 mL/min — ABNORMAL LOW (ref >60)
Glucose, Bld: 121 mg/dL — ABNORMAL HIGH (ref 70–99)
Potassium: 4.6 mmol/L (ref 3.5–5.1)
Sodium: 125 mmol/L — ABNORMAL LOW (ref 135–145)

## 2024-01-19 LAB — CBC WITH DIFFERENTIAL/PLATELET
Abs Immature Granulocytes: 0.07 10*3/uL (ref 0.00–0.07)
Basophils Absolute: 0 10*3/uL (ref 0.0–0.1)
Basophils Relative: 0 %
Eosinophils Absolute: 0.9 10*3/uL — ABNORMAL HIGH (ref 0.0–0.5)
Eosinophils Relative: 7 %
HCT: 32.6 % — ABNORMAL LOW (ref 36.0–46.0)
Hemoglobin: 11.2 g/dL — ABNORMAL LOW (ref 12.0–15.0)
Immature Granulocytes: 1 %
Lymphocytes Relative: 12 %
Lymphs Abs: 1.7 10*3/uL (ref 0.7–4.0)
MCH: 32.1 pg (ref 26.0–34.0)
MCHC: 34.4 g/dL (ref 30.0–36.0)
MCV: 93.4 fL (ref 80.0–100.0)
Monocytes Absolute: 0.8 10*3/uL (ref 0.1–1.0)
Monocytes Relative: 6 %
Neutro Abs: 10 10*3/uL — ABNORMAL HIGH (ref 1.7–7.7)
Neutrophils Relative %: 74 %
Platelets: 284 10*3/uL (ref 150–400)
RBC: 3.49 MIL/uL — ABNORMAL LOW (ref 3.87–5.11)
RDW: 13.5 % (ref 11.5–15.5)
WBC: 13.5 10*3/uL — ABNORMAL HIGH (ref 4.0–10.5)
nRBC: 0 % (ref 0.0–0.2)

## 2024-01-19 MED ORDER — SODIUM CHLORIDE 0.9 % IV BOLUS
1000.0000 mL | Freq: Once | INTRAVENOUS | Status: AC
  Administered 2024-01-19: 1000 mL via INTRAVENOUS

## 2024-01-19 MED ORDER — SODIUM CHLORIDE 0.9 % IV SOLN
1.0000 g | INTRAVENOUS | Status: AC
  Administered 2024-01-19: 1 g via INTRAVENOUS
  Filled 2024-01-19: qty 10

## 2024-01-19 MED ORDER — METOPROLOL TARTRATE 25 MG PO TABS
25.0000 mg | ORAL_TABLET | ORAL | Status: AC
  Administered 2024-01-19: 25 mg via ORAL
  Filled 2024-01-19: qty 1

## 2024-01-19 MED ORDER — GABAPENTIN 100 MG PO CAPS
100.0000 mg | ORAL_CAPSULE | ORAL | Status: AC
  Administered 2024-01-19: 100 mg via ORAL
  Filled 2024-01-19: qty 1

## 2024-01-19 MED ORDER — OXYCODONE-ACETAMINOPHEN 5-325 MG PO TABS
1.0000 | ORAL_TABLET | Freq: Four times a day (QID) | ORAL | 0 refills | Status: AC | PRN
Start: 2024-01-19 — End: ?

## 2024-01-19 MED ORDER — FENTANYL CITRATE PF 50 MCG/ML IJ SOSY
50.0000 ug | PREFILLED_SYRINGE | INTRAMUSCULAR | Status: AC
  Administered 2024-01-19: 50 ug via INTRAVENOUS
  Filled 2024-01-19: qty 1

## 2024-01-19 NOTE — ED Notes (Signed)
Pt cleaned of urine, brief changed using home brief.

## 2024-01-19 NOTE — ED Notes (Signed)
 Pt currently in MRI

## 2024-01-19 NOTE — ED Notes (Signed)
Pt able to ambulate with walker and this RN to toilet. Pt stating feeling very weak with pain shooting throughout body. Pt stating she feels uncomfortable getting a taxi to get home since taxi cannot help pt get inside house. This RN agrees with pt and not being safe for pt to walk without support to get inside house.

## 2024-01-19 NOTE — ED Notes (Signed)
This NT assisted pt to bathroom in room and back to bed. Pt is now resting comfortably in bed and hooked up to monitor.

## 2024-01-19 NOTE — Discharge Instructions (Addendum)
As we discussed, your MRI tonight was reassuring, and fortunately your pain was improved after treatment.  You still have a urinary tract infection and we gave you some IV antibiotics.  Please continue taking the Keflex at home either this evening or starting tomorrow morning.  It is important you finish the full course of treatment.  Please also remember to drink plenty of fluids and take your regular medications; your sodium level is a bit low today and you should follow-up with your regular doctor to have some repeat lab work performed to make sure that it is coming back up.  Chronic pain is not managed in the emergency department, but given the acute pain he experienced over the last 2 days, we give you a short course of stronger pain medication.  Please use it only as directed and it will not be refilled once you have used up what you have.  Additional pain management should be through Dr. Yves Dill.

## 2024-01-19 NOTE — ED Provider Notes (Signed)
[facility] Provider Note    Event Date/Time   First MD Initiated Contact with Patient 01/19/24 0330     (approximate)   History   Back Pain   HPI Valerie Hall is a 73 y.o. female who presents for evaluation of severe worsening back pain over the last 2 days.  She has an extensive history of low back problems and receives injections by Dr. Yves Dill to try to control the pain.  However, she says that she strained something 2 days ago and the pain has been excruciating.  She has also had some issues with bladder incontinence but that may be complicated by the fact that she has foul-smelling urine and believes that she has a urinary tract infection for which she was just started on antibiotics about a day ago.  The primary issue is the severe pain that is rating from the middle of her lower back down both of her legs and causing both some numbness and pain.  Nothing is helping in any amount of movement makes it worse.  No recent fever/chills, chest pain, shortness of breath.     Physical Exam   Triage Vital Signs: ED Triage Vitals  Encounter Vitals Group     BP 01/18/24 2028 (!) 177/69     Systolic BP Percentile --      Diastolic BP Percentile --      Pulse Rate 01/18/24 2028 78     Resp 01/18/24 2028 18     Temp 01/18/24 2028 97.8 F (36.6 C)     Temp Source 01/18/24 2028 Oral     SpO2 01/18/24 2028 95 %     Weight --      Height --      Head Circumference --      Peak Flow --      Pain Score 01/18/24 2029 10     Pain Loc --      Pain Education --      Exclude from Growth Chart --     Most recent vital signs: Vitals:   01/19/24 0542 01/19/24 0602  BP: (!) 173/93 (!) 182/52  Pulse: 84 76  Resp: 16 15  Temp: 98 F (36.7 C) 98.2 F (36.8 C)  SpO2: 100% 96%    General: Awake, appears very uncomfortable and he was extremely agitated. CV:  Good peripheral perfusion.  Regular rate and rhythm. Resp:  Normal effort. Speaking easily and  comfortably, no accessory muscle usage nor intercostal retractions.   Abd:  No distention.  Other:  Exam is difficult due to the patient's discomfort.  However she has severe tenderness to palpation of the lower back, worse on the right side than the left, but also with midline tenderness of the lumbar spine.  Pain with any attempted ambulation and with attempts at straight leg raise on either side.   ED Results / Procedures / Treatments   Labs (all labs ordered are listed, but only abnormal results are displayed) Labs Reviewed  URINALYSIS, ROUTINE W REFLEX MICROSCOPIC - Abnormal; Notable for the following components:      Result Value   Color, Urine AMBER (*)    APPearance CLEAR (*)    Nitrite POSITIVE (*)    Bacteria, UA RARE (*)    All other components within normal limits  BASIC METABOLIC PANEL - Abnormal; Notable for the following components:   Sodium 125 (*)    Chloride 89 (*)    Glucose, Bld 121 (*)  Creatinine, Ser 1.24 (*)    GFR, Estimated 46 (*)    All other components within normal limits  CBC WITH DIFFERENTIAL/PLATELET - Abnormal; Notable for the following components:   WBC 13.5 (*)    RBC 3.49 (*)    Hemoglobin 11.2 (*)    HCT 32.6 (*)    Neutro Abs 10.0 (*)    Eosinophils Absolute 0.9 (*)    All other components within normal limits  URINE CULTURE      RADIOLOGY MRI lumbar spine -see hospital course for details   PROCEDURES:  Critical Care performed: No  Procedures    IMPRESSION / MDM / ASSESSMENT AND PLAN / ED COURSE  I reviewed the triage vital signs and the nursing notes.                              Differential diagnosis includes, but is not limited to, radiculopathy, nerve impingement, sciatica, cauda equina syndrome, epidural abscess, discitis, osteomyelitis  Patient's presentation is most consistent with acute presentation with potential threat to life or bodily function.  Labs/studies ordered: Urinalysis, basic metabolic panel,  urine culture, CBC with differential, MRI lumbar spine  Interventions/Medications given:  Medications  fentaNYL (SUBLIMAZE) injection 50 mcg (50 mcg Intravenous Given 01/19/24 0551)  metoprolol tartrate (LOPRESSOR) tablet 25 mg (25 mg Oral Given 01/19/24 0552)  cefTRIAXone (ROCEPHIN) 1 g in sodium chloride 0.9 % 100 mL IVPB (0 g Intravenous Stopped 01/19/24 0642)  sodium chloride 0.9 % bolus 1,000 mL (0 mLs Intravenous Stopped 01/19/24 0754)  gabapentin (NEURONTIN) capsule 100 mg (100 mg Oral Given 01/19/24 0753)    (Note:  hospital course my include additional interventions and/or labs/studies not listed above.)   Patient in severe pain and has substantially elevated blood pressure but that may be due to at least in part to pain and because she missed her medications.  I am concerned about the worsening pain as well as the urine incontinence although that may be related to her UTI.  Urine culture is pending, ordering ceftriaxone 1 g IV to begin treatment.  Obtaining MRI lumbar spine.  Fentanyl 50 mcg IV has worked for her in the past so I will do that again.  I am also giving her a dose of metoprolol tartrate 25 mg; she has not yet had her dose of metoprolol succinate 50 mg, but I think that the tartrate may help both with her agitation/anxiety and help bring down her blood pressure little bit.     Clinical Course as of 01/19/24 0825  Wynelle Link Jan 19, 2024  0655 I reassessed the patient and she said that she feels much better.  She is able to move much better than she did before and has had no more issues with urinary incontinence.  We discussed the results of her labs including her hyponatremia.  She agreed that she has not been eating or drinking very well in the last couple of days due to her pain.  She is comfortable with the plan for discharge home after a liter of normal saline.  She asked me about pain medication and I believe a short course of medication listed below is appropriate, but I  stressed that she needs to follow-up with Dr. Yves Dill.  She understands and agrees with the plan and I gave my usual return precautions.  The patient's medical screening exam is reassuring with no indication of an emergent medical condition requiring hospitalization or additional  evaluation at this point.  The patient is safe and appropriate for discharge and outpatient follow up. [CF]    Clinical Course User Index [CF] Loleta Rose, MD     FINAL CLINICAL IMPRESSION(S) / ED DIAGNOSES   Final diagnoses:  Chronic bilateral low back pain with bilateral sciatica  Hyponatremia  Acute cystitis without hematuria     Rx / DC Orders   ED Discharge Orders          Ordered    oxyCODONE-acetaminophen (PERCOCET) 5-325 MG tablet  Every 6 hours PRN        01/19/24 0659             Note:  This document was prepared using Dragon voice recognition software and may include unintentional dictation errors.   Loleta Rose, MD 01/19/24 [id number]

## 2024-01-20 LAB — URINE CULTURE: Culture: NO GROWTH

## 2024-05-28 ENCOUNTER — Encounter (HOSPITAL_BASED_OUTPATIENT_CLINIC_OR_DEPARTMENT_OTHER): Payer: Self-pay | Admitting: Internal Medicine

## 2024-06-03 ENCOUNTER — Other Ambulatory Visit: Payer: Self-pay | Admitting: Internal Medicine

## 2024-06-03 DIAGNOSIS — D72829 Elevated white blood cell count, unspecified: Secondary | ICD-10-CM

## 2024-06-03 DIAGNOSIS — R769 Abnormal immunological finding in serum, unspecified: Secondary | ICD-10-CM

## 2024-06-08 ENCOUNTER — Ambulatory Visit: Admission: RE | Admit: 2024-06-08 | Payer: Medicare (Managed Care) | Source: Ambulatory Visit

## 2024-06-22 ENCOUNTER — Ambulatory Visit
Admission: RE | Admit: 2024-06-22 | Discharge: 2024-06-22 | Disposition: A | Discharge status: Home / Self Care | Payer: Medicare (Managed Care) | Source: Ambulatory Visit | Attending: Internal Medicine | Admitting: Internal Medicine

## 2024-06-22 DIAGNOSIS — R769 Abnormal immunological finding in serum, unspecified: Secondary | ICD-10-CM | POA: Insufficient documentation

## 2024-06-22 DIAGNOSIS — D72829 Elevated white blood cell count, unspecified: Secondary | ICD-10-CM | POA: Insufficient documentation

## 2024-06-22 LAB — POCT I-STAT CREATININE: Creatinine, Ser: 1.5 mg/dL — ABNORMAL HIGH (ref 0.44–1.00)

## 2024-06-22 MED ORDER — IOHEXOL 300 MG/ML  SOLN
100.0000 mL | Freq: Once | INTRAMUSCULAR | Status: AC | PRN
Start: 2024-06-22 — End: 2024-06-22
  Administered 2024-06-22: 100 mL via INTRAVENOUS
